# Patient Record
Sex: Female | Born: 1935 | Race: White | Hispanic: No | Marital: Married | Smoking: Former smoker
Health system: Southern US, Community
[De-identification: ages and names within clinical notes are randomized; demographics above are authoritative.]

## PROBLEM LIST (undated history)

## (undated) DIAGNOSIS — I4891 Unspecified atrial fibrillation: Secondary | ICD-10-CM

## (undated) DIAGNOSIS — F0390 Unspecified dementia without behavioral disturbance: Secondary | ICD-10-CM

## (undated) DIAGNOSIS — C439 Malignant melanoma of skin, unspecified: Secondary | ICD-10-CM

## (undated) DIAGNOSIS — I10 Essential (primary) hypertension: Secondary | ICD-10-CM

## (undated) HISTORY — PX: SKIN GRAFT: SHX250

## (undated) HISTORY — DX: Malignant melanoma of skin, unspecified: C43.9

## (undated) HISTORY — PX: SKIN SURGERY: SHX2413

---

## 2012-05-08 ENCOUNTER — Ambulatory Visit: Payer: Self-pay

## 2012-05-14 ENCOUNTER — Ambulatory Visit: Payer: Self-pay

## 2012-07-06 ENCOUNTER — Ambulatory Visit: Payer: Self-pay | Admitting: Neurology

## 2013-02-04 ENCOUNTER — Inpatient Hospital Stay: Payer: Self-pay

## 2013-02-04 LAB — COMPREHENSIVE METABOLIC PANEL
Albumin: 2.8 g/dL — ABNORMAL LOW (ref 3.4–5.0)
Anion Gap: 10 (ref 7–16)
BUN: 11 mg/dL (ref 7–18)
Bilirubin,Total: 0.5 mg/dL (ref 0.2–1.0)
Creatinine: 0.37 mg/dL — ABNORMAL LOW (ref 0.60–1.30)
EGFR (Non-African Amer.): >60
Potassium: 3.8 mmol/L (ref 3.5–5.1)
SGOT(AST): 35 U/L (ref 15–37)
Sodium: 139 mmol/L (ref 136–145)

## 2013-02-04 LAB — CBC
HGB: 13.4 g/dL (ref 12.0–16.0)
MCH: 29.5 pg (ref 26.0–34.0)
MCV: 88 fL (ref 80–100)
RBC: 4.54 10*6/uL (ref 3.80–5.20)
RDW: 15.3 % — ABNORMAL HIGH (ref 11.5–14.5)

## 2013-02-04 LAB — PRO B NATRIURETIC PEPTIDE: B-Type Natriuretic Peptide: 1463 pg/mL — ABNORMAL HIGH (ref 0–450)

## 2013-02-04 LAB — TROPONIN I: Troponin-I: <0.02 ng/mL

## 2013-02-05 LAB — BASIC METABOLIC PANEL
Calcium, Total: 8.4 mg/dL — ABNORMAL LOW (ref 8.5–10.1)
Chloride: 106 mmol/L (ref 98–107)
Co2: 25 mmol/L (ref 21–32)
Creatinine: 0.7 mg/dL (ref 0.60–1.30)
EGFR (African American): >60
EGFR (Non-African Amer.): >60
Osmolality: 285 (ref 275–301)
Potassium: 3.9 mmol/L (ref 3.5–5.1)
Sodium: 140 mmol/L (ref 136–145)

## 2013-02-05 LAB — CBC WITH DIFFERENTIAL/PLATELET
Basophil #: 0 10*3/uL (ref 0.0–0.1)
Basophil %: 0.4 %
Eosinophil %: 0.1 %
HCT: 40.2 % (ref 35.0–47.0)
Lymphocyte #: 0.4 10*3/uL — ABNORMAL LOW (ref 1.0–3.6)
Lymphocyte %: 5.4 %
MCH: 29.8 pg (ref 26.0–34.0)
MCV: 89 fL (ref 80–100)
Neutrophil #: 6.4 10*3/uL (ref 1.4–6.5)
RBC: 4.53 10*6/uL (ref 3.80–5.20)
RDW: 15.6 % — ABNORMAL HIGH (ref 11.5–14.5)
WBC: 6.9 10*3/uL (ref 3.6–11.0)

## 2013-02-05 LAB — CK TOTAL AND CKMB (NOT AT ARMC)
CK, Total: 170 U/L (ref 21–215)
CK, Total: 142 U/L (ref 21–215)
CK, Total: 293 U/L — ABNORMAL HIGH (ref 21–215)
CK-MB: 3 ng/mL (ref 0.5–3.6)
CK-MB: 6.4 ng/mL — ABNORMAL HIGH (ref 0.5–3.6)

## 2013-02-05 LAB — TROPONIN I: Troponin-I: 0.22 ng/mL — ABNORMAL HIGH

## 2013-02-06 LAB — BASIC METABOLIC PANEL
Anion Gap: 6 — ABNORMAL LOW (ref 7–16)
Chloride: 106 mmol/L (ref 98–107)
Co2: 27 mmol/L (ref 21–32)
EGFR (Non-African Amer.): >60
Osmolality: 286 (ref 275–301)
Sodium: 139 mmol/L (ref 136–145)

## 2013-03-15 ENCOUNTER — Ambulatory Visit: Payer: Self-pay | Admitting: Oncology

## 2013-03-15 LAB — CBC CANCER CENTER
Eosinophil %: 0.7 %
HCT: 38.6 % (ref 35.0–47.0)
HGB: 12.7 g/dL (ref 12.0–16.0)
Lymphocyte #: 1.8 x10 3/mm (ref 1.0–3.6)
Lymphocyte %: 12.7 %
MCH: 29.4 pg (ref 26.0–34.0)
MCHC: 33 g/dL (ref 32.0–36.0)
Monocyte %: 6.8 %
Neutrophil %: 79.4 %
Platelet: 270 x10 3/mm (ref 150–440)
RBC: 4.33 10*6/uL (ref 3.80–5.20)
RDW: 15.8 % — ABNORMAL HIGH (ref 11.5–14.5)
WBC: 14.1 x10 3/mm — ABNORMAL HIGH (ref 3.6–11.0)

## 2013-03-15 LAB — COMPREHENSIVE METABOLIC PANEL
Albumin: 2.8 g/dL — ABNORMAL LOW (ref 3.4–5.0)
Anion Gap: 1 — ABNORMAL LOW (ref 7–16)
Bilirubin,Total: 0.4 mg/dL (ref 0.2–1.0)
Chloride: 106 mmol/L (ref 98–107)
Co2: 33 mmol/L — ABNORMAL HIGH (ref 21–32)
Creatinine: 0.68 mg/dL (ref 0.60–1.30)
EGFR (African American): >60
EGFR (Non-African Amer.): >60
Glucose: 101 mg/dL — ABNORMAL HIGH (ref 65–99)
Osmolality: 280 (ref 275–301)
Potassium: 4.6 mmol/L (ref 3.5–5.1)
SGOT(AST): 23 U/L (ref 15–37)
SGPT (ALT): 24 U/L (ref 12–78)

## 2013-03-15 LAB — LACTATE DEHYDROGENASE: LDH: 225 U/L (ref 81–246)

## 2013-03-20 ENCOUNTER — Ambulatory Visit: Payer: Self-pay | Admitting: Oncology

## 2013-03-29 ENCOUNTER — Ambulatory Visit: Payer: Self-pay | Admitting: Oncology

## 2013-04-19 ENCOUNTER — Ambulatory Visit: Payer: Self-pay | Admitting: Internal Medicine

## 2013-04-24 LAB — PATHOLOGY REPORT

## 2013-04-29 ENCOUNTER — Ambulatory Visit: Payer: Self-pay | Admitting: Oncology

## 2013-05-09 ENCOUNTER — Ambulatory Visit: Payer: Self-pay | Admitting: Surgery

## 2013-05-09 LAB — CBC WITH DIFFERENTIAL/PLATELET
Basophil #: 0 10*3/uL (ref 0.0–0.1)
Basophil %: 0.3 %
Eosinophil %: 1.8 %
Lymphocyte %: 16 %
MCH: 29.7 pg (ref 26.0–34.0)
MCHC: 33.2 g/dL (ref 32.0–36.0)
Monocyte %: 6.6 %
Neutrophil %: 75.3 %
Platelet: 267 10*3/uL (ref 150–440)
RBC: 4.27 10*6/uL (ref 3.80–5.20)
RDW: 15.8 % — ABNORMAL HIGH (ref 11.5–14.5)
WBC: 8.6 10*3/uL (ref 3.6–11.0)

## 2013-05-09 LAB — BASIC METABOLIC PANEL
Anion Gap: 1 — ABNORMAL LOW (ref 7–16)
Chloride: 105 mmol/L (ref 98–107)
Co2: 33 mmol/L — ABNORMAL HIGH (ref 21–32)
Creatinine: 0.47 mg/dL — ABNORMAL LOW (ref 0.60–1.30)
EGFR (African American): >60
EGFR (Non-African Amer.): >60
Glucose: 74 mg/dL (ref 65–99)

## 2013-05-16 ENCOUNTER — Ambulatory Visit: Payer: Self-pay | Admitting: Internal Medicine

## 2013-05-16 ENCOUNTER — Inpatient Hospital Stay: Payer: Self-pay | Admitting: Family Medicine

## 2013-05-17 ENCOUNTER — Encounter (HOSPITAL_COMMUNITY): Payer: Self-pay | Admitting: Neurology

## 2013-05-17 ENCOUNTER — Inpatient Hospital Stay (HOSPITAL_COMMUNITY): Payer: Medicare Other

## 2013-05-17 ENCOUNTER — Inpatient Hospital Stay (HOSPITAL_COMMUNITY)
Admission: AD | Admit: 2013-05-17 | Discharge: 2013-05-23 | DRG: 065 | Disposition: A | Discharge status: Rehabilitation Facility | Payer: Medicare Other | Source: Other Acute Inpatient Hospital | Attending: Neurology | Admitting: Neurology

## 2013-05-17 ENCOUNTER — Other Ambulatory Visit: Payer: Self-pay | Admitting: Neurology

## 2013-05-17 DIAGNOSIS — E785 Hyperlipidemia, unspecified: Secondary | ICD-10-CM | POA: Diagnosis present

## 2013-05-17 DIAGNOSIS — R4701 Aphasia: Secondary | ICD-10-CM | POA: Diagnosis present

## 2013-05-17 DIAGNOSIS — Z9282 Status post administration of tPA (rtPA) in a different facility within the last 24 hours prior to admission to current facility: Secondary | ICD-10-CM

## 2013-05-17 DIAGNOSIS — I6350 Cerebral infarction due to unspecified occlusion or stenosis of unspecified cerebral artery: Secondary | ICD-10-CM

## 2013-05-17 DIAGNOSIS — I10 Essential (primary) hypertension: Secondary | ICD-10-CM | POA: Diagnosis present

## 2013-05-17 DIAGNOSIS — I6340 Cerebral infarction due to embolism of unspecified cerebral artery: Principal | ICD-10-CM | POA: Diagnosis present

## 2013-05-17 DIAGNOSIS — Z9889 Other specified postprocedural states: Secondary | ICD-10-CM

## 2013-05-17 DIAGNOSIS — IMO0002 Reserved for concepts with insufficient information to code with codable children: Secondary | ICD-10-CM

## 2013-05-17 DIAGNOSIS — Z87891 Personal history of nicotine dependence: Secondary | ICD-10-CM

## 2013-05-17 DIAGNOSIS — R29810 Facial weakness: Secondary | ICD-10-CM | POA: Diagnosis present

## 2013-05-17 DIAGNOSIS — C439 Malignant melanoma of skin, unspecified: Secondary | ICD-10-CM | POA: Diagnosis present

## 2013-05-17 DIAGNOSIS — Z79899 Other long term (current) drug therapy: Secondary | ICD-10-CM

## 2013-05-17 DIAGNOSIS — I639 Cerebral infarction, unspecified: Secondary | ICD-10-CM | POA: Diagnosis present

## 2013-05-17 DIAGNOSIS — F0390 Unspecified dementia without behavioral disturbance: Secondary | ICD-10-CM | POA: Diagnosis present

## 2013-05-17 DIAGNOSIS — I4891 Unspecified atrial fibrillation: Secondary | ICD-10-CM | POA: Diagnosis present

## 2013-05-17 HISTORY — DX: Unspecified dementia, unspecified severity, without behavioral disturbance, psychotic disturbance, mood disturbance, and anxiety: F03.90

## 2013-05-17 HISTORY — DX: Unspecified atrial fibrillation: I48.91

## 2013-05-17 HISTORY — DX: Essential (primary) hypertension: I10

## 2013-05-17 LAB — CBC WITH DIFFERENTIAL/PLATELET
Basophil #: 0 10*3/uL (ref 0.0–0.1)
Basophil %: 0.5 %
Eosinophil #: 0.2 10*3/uL (ref 0.0–0.7)
Lymphocyte #: 1.2 10*3/uL (ref 1.0–3.6)
Lymphocyte %: 15.5 %
MCV: 90 fL (ref 80–100)
Monocyte #: 0.7 x10 3/mm (ref 0.2–0.9)
Neutrophil #: 5.7 10*3/uL (ref 1.4–6.5)
Neutrophil %: 73.4 %
Platelet: 205 10*3/uL (ref 150–440)
RDW: 15.7 % — ABNORMAL HIGH (ref 11.5–14.5)

## 2013-05-17 LAB — GLUCOSE, CAPILLARY: Glucose-Capillary: 122 mg/dL — ABNORMAL HIGH (ref 70–99)

## 2013-05-17 LAB — HEMOGLOBIN A1C
Hgb A1c MFr Bld: 6.2 % — ABNORMAL HIGH (ref <5.7)
Mean Plasma Glucose: 131 mg/dL — ABNORMAL HIGH (ref <117)

## 2013-05-17 LAB — BASIC METABOLIC PANEL
Anion Gap: 2 — ABNORMAL LOW (ref 7–16)
Calcium, Total: 8.2 mg/dL — ABNORMAL LOW (ref 8.5–10.1)
Creatinine: 0.57 mg/dL — ABNORMAL LOW (ref 0.60–1.30)
EGFR (African American): >60
Glucose: 106 mg/dL — ABNORMAL HIGH (ref 65–99)
Potassium: 4.7 mmol/L (ref 3.5–5.1)

## 2013-05-17 LAB — MRSA PCR SCREENING: MRSA by PCR: NEGATIVE

## 2013-05-17 LAB — PROTIME-INR: INR: 0.9

## 2013-05-17 MED ORDER — PANTOPRAZOLE SODIUM 40 MG IV SOLR
40.0000 mg | Freq: Every day | INTRAVENOUS | Status: DC
  Administered 2013-05-17 – 2013-05-19 (×3): 40 mg via INTRAVENOUS
  Filled 2013-05-17 (×3): qty 40

## 2013-05-17 MED ORDER — ACETAMINOPHEN 325 MG PO TABS
650.0000 mg | ORAL_TABLET | ORAL | Status: DC | PRN
  Administered 2013-05-19 – 2013-05-23 (×10): 650 mg via ORAL
  Filled 2013-05-17 (×11): qty 2

## 2013-05-17 MED ORDER — ACETAMINOPHEN 650 MG RE SUPP: 650.0000 mg | RECTAL | Status: DC | PRN

## 2013-05-17 MED ORDER — SENNOSIDES-DOCUSATE SODIUM 8.6-50 MG PO TABS
1.0000 | ORAL_TABLET | Freq: Every evening | ORAL | Status: DC | PRN
  Filled 2013-05-17: qty 1

## 2013-05-17 MED ORDER — LABETALOL HCL 5 MG/ML IV SOLN: 10.0000 mg | INTRAVENOUS | Status: DC | PRN

## 2013-05-17 MED ORDER — INSULIN ASPART 100 UNIT/ML ~~LOC~~ SOLN: 0.0000 [IU] | Freq: Three times a day (TID) | SUBCUTANEOUS | Status: DC

## 2013-05-17 MED ORDER — SODIUM CHLORIDE 0.9 % IV SOLN
INTRAVENOUS | Status: DC
  Administered 2013-05-17 – 2013-05-20 (×3): via INTRAVENOUS

## 2013-05-17 MED ORDER — INSULIN ASPART 100 UNIT/ML ~~LOC~~ SOLN: 0.0000 [IU] | SUBCUTANEOUS | Status: DC

## 2013-05-17 NOTE — Code Documentation (Signed)
79 year old female transferred from [facility] this afternoon s/p tPA for sx of acute stroke.  According to report from [facility] RN patient is s/p melanoma removal from left leg with skin graft repair.  She was LSW at 1145 today when she had sudden onset of inability to speak with right facial droop and right arm weakness.  According to notes sent with patient Dr. Mordecai Maes at [facility] noted NIHSS of 11.  Patient was administered IV tPA at 1412.  Dr. Roseanne Reno neurologist here was consulted post tPA and patient was transferred to [facility] by [facility].  On arrival at 1606 stroke team met patient in ED at the bridge for quick assessment for IR consideration.  Dr. Roseanne Reno examined patient and decided not to proceed with IR. Patient was taken straight to 3M04.  NIHSS at that time was 08 - see doc flowsheet for details.  LR was infusing - BP 102/52  - NS bolus 500 cc IV given per order Dr. Roseanne Reno.  Left lower leg VAC dressing site was oozing blood -  Left upper leg graft site was dressed with Kerlix dressing - minimial blood noted.  Dr. Janee Morn - general surgeon in to see patient wounds.  Handoff to [employer].

## 2013-05-17 NOTE — H&P (Signed)
Admission H&P    Chief Complaint: stroke HPI: Melanie Wood is an 79 y.o. female who recently was taken off her Xarelto for 4 days in order to have a melanoma removed. Today at 1115 she was noted to have right facial droop, slurred speech and right arm drift.  [name] was brought to [place] [facility] where code stroke was called.  Patient was given tPA and transferred to [facility] hospital. On arrival patient was brought to 3100. Exam showed improved strength but patient remained dysarthric with expressive difficulties. She does have a wound vac on her left leg with a moderate amount of blood--general surgery has been consulted to see patient and make recommendations.   LSN: [id number] tPA Given: Yes NIHSS 8  Past Medical History  Diagnosis Date  . Dementia   . A-fib   . Hypertension     Past Surgical History  Procedure Laterality Date  . Skin graft    . Skin surgery      No family history on file. Social History:  has no tobacco, alcohol, and drug history on file.  Allergies:  Allergies  Allergen Reactions  . Shellfish Allergy     No prescriptions prior to admission    ROS: Negative with exception of above mentioned in HPI  Physical Examination: There were no vitals taken for this visit.  HEENT-  Normocephalic, no lesions, without obvious abnormality.  Normal external eye and conjunctiva.  Normal TM's bilaterally.  Normal auditory canals and external ears. Normal external nose, mucus membranes and septum.  Normal pharynx. Neck supple with no masses, nodes, nodules or enlargement. Cardiovascular - regular rate and rhythm, S1, S2 normal, no murmur, click, rub or gallop Lungs - chest clear, no wheezing, rales, normal symmetric air entry, Heart exam - S1, S2 normal, no murmur, no gallop, rate regular Abdomen - soft, non-tender; bowel sounds normal; no masses,  no organomegaly Extremities - no edema  Neurologic Examination: Mental Status: Alert, markedly dysarthric with expressive  aphasia.  Able to follow 3 step commands without difficulty. Cranial Nerves: II: Discs flat bilaterally; Visual fields grossly normal, pupils equal, round, reactive to light and accommodation III,IV, VI: ptosis not present, extra-ocular motions intact bilaterally V,VII: right facial droop, facial light touch sensation normal bilaterally VIII: hearing normal bilaterally IX,X: gag reflex present XI: bilateral shoulder shrug XII: midline tongue extension Motor: Right : Upper extremity   5/5    Left:     Upper extremity   5/5  Lower extremity   5/5     Lower extremity   5/5 Tone and bulk:normal tone throughout; no atrophy noted Sensory: Pinprick and light touch intact throughout, bilaterally Deep Tendon Reflexes:  Depressed throughout Plantars: Mute bilaterally Cerebellar: normal finger-to-nose,  normal heel-to-shin test Gait: not tested CV: pulses palpable throughout   No results found for this or any previous visit (from the past 48 hour(s)). No results found.  Assessment: 79 y.o. female with acute stroke (Left MCA) S/P stopping Xarelto for 4 days for melanoma removal from left leg. Patient received tPA while in [facility] and transferred to [place].  Patient will be admitted to stroke service and followed closely.   Stroke Risk Factors - atrial fibrillation and hypertension  Plan: 1. HgbA1c, fasting lipid panel 2. MRI, MRA  of the brain without contrast 3. PT consult, OT consult, Speech consult 4. Echocardiogram 5. Carotid dopplers 6. Prophylactic therapy-Resumption of anticoagulation per Stroke Team if CT scan 24 hours post TPA administration shows no signs of intracranial  hemorrhage 7. Risk factor modification 8. Telemetry monitoring 9. Surgery consulted for recommendations on recent skin gaft   Assessment and plan discussed with attending physician and they are in agreement.    [name] PA-C [facility] [phone]  05/17/2013, 4:41 PM  I  personally participate in this patient's evaluation and management, including neurological examination as well as formulation of the above clinical impression and management recommendations.  [name] M.D. [facility] [phone]

## 2013-05-17 NOTE — Progress Notes (Signed)
VASCULAR LAB PRELIMINARY  PRELIMINARY  PRELIMINARY  PRELIMINARY  Carotid duplex completed.    Preliminary report:  Bilateral:  1-39% ICA stenosis.  Vertebral artery flow is antegrade.     Nima Kemppainen, RVS 05/17/2013, 5:34 PM

## 2013-05-17 NOTE — Consult Note (Signed)
Reason for Consult: Bleeding from melanoma surgery site Referring Physician: Noel Christmas  Melanie Wood is an 79 y.o. female.  HPI: Patient has a history of atrial fibrillation and is usually on Xarelto. This was held for a period of time and she underwent surgery yesterday which included wide excision melanoma left posterior calf, sentinel lymph node biopsy left inguinal region, split-thickness skin graft. This was done and [place] [facility].The patient developed expressive aphasia today consistent with acute stroke. She received TPA at [facility] and was transferred to [facility] hospital.She has been admitted by the stroke service. She was noted to have collection of blood underneath her wound VAC as well as some bleeding from her skin graft donor site on her left thigh. I was asked to see her in consultation by Dr. Roseanne Reno regarding management of the postop bleeding.  Past Medical History  Diagnosis Date  . Dementia   . A-fib   . Hypertension     Past Surgical History  Procedure Laterality Date  . Skin graft    . Skin surgery      No family history on file.  Social History:  has no tobacco, alcohol, and drug history on file.  Allergies:  Allergies  Allergen Reactions  . Shellfish Allergy     Medications:  Prior to Admission:  No prescriptions prior to admission    No results found for this or any previous visit (from the past 48 hour(s)).  No results found.  Review of Systems  Unable to perform ROS: mental status change   There were no vitals taken for this visit. Physical Exam  Constitutional: She appears well-developed and well-nourished. No distress.  HENT:  Head: Normocephalic.  Neck: No tracheal deviation present.  Cardiovascular:  Irregularly irregular rhythm, 80s  Respiratory: Effort normal and breath sounds normal. No stridor. No respiratory distress. She has no wheezes. She has no rales.  GI: Soft. She exhibits no distension. There is no  tenderness.  Musculoskeletal:       Legs: Wound VAC anterior left shin extending around to large area posterior left calf with some blood collected underneath the VAC sponge. Donor site clean with mild ooze left thigh. JP left inguinal region with bloody drainage.  Neurological: She is alert. She exhibits normal muscle tone. GCS eye subscore is 4. GCS verbal subscore is 1. GCS motor subscore is 6.  Awake and follows commands, complete expressive aphasia    Assessment/Plan: Acute CVA with expressive aphasia - management per stroke team  Bleeding secondary to TPA postop day 1 status post wide excision melanoma left calf with split-thickness skin graft coverage and left inguinal sentinel node biopsy. I changed the dressing on her donor site to Xeroform covered with Kerlix. I would leave the Xeroform in place and change the Kerlix daily or when necessary. We will re\re hook her back to suction to try to save the skin graft. This should remain on for 5 days postoperatively. We will continue to follow her. If she continues to have bleeding, she may need her back removed. I spoke with her daughter on the unit and explained the plan of care. I also discussed the plan with Dr. Roseanne Reno.  Thank you for this consult.  Jaymes Revels E 05/17/2013, 4:52 PM

## 2013-05-18 ENCOUNTER — Inpatient Hospital Stay (HOSPITAL_COMMUNITY): Payer: Medicare Other

## 2013-05-18 DIAGNOSIS — I639 Cerebral infarction, unspecified: Secondary | ICD-10-CM | POA: Diagnosis present

## 2013-05-18 DIAGNOSIS — R4701 Aphasia: Secondary | ICD-10-CM | POA: Diagnosis present

## 2013-05-18 DIAGNOSIS — I4891 Unspecified atrial fibrillation: Secondary | ICD-10-CM

## 2013-05-18 DIAGNOSIS — I369 Nonrheumatic tricuspid valve disorder, unspecified: Secondary | ICD-10-CM

## 2013-05-18 LAB — LIPID PANEL
HDL: 71 mg/dL (ref >39)
LDL Cholesterol: 114 mg/dL — ABNORMAL HIGH (ref 0–99)
Total CHOL/HDL Ratio: 3 RATIO
VLDL: 31 mg/dL (ref 0–40)

## 2013-05-18 LAB — GLUCOSE, CAPILLARY
Glucose-Capillary: 117 mg/dL — ABNORMAL HIGH (ref 70–99)
Glucose-Capillary: 83 mg/dL (ref 70–99)
Glucose-Capillary: 89 mg/dL (ref 70–99)
Glucose-Capillary: 94 mg/dL (ref 70–99)
Glucose-Capillary: 98 mg/dL (ref 70–99)

## 2013-05-18 MED ORDER — MOMETASONE FURO-FORMOTEROL FUM 100-5 MCG/ACT IN AERO
2.0000 | INHALATION_SPRAY | Freq: Two times a day (BID) | RESPIRATORY_TRACT | Status: DC
  Administered 2013-05-18 – 2013-05-22 (×9): 2 via RESPIRATORY_TRACT
  Filled 2013-05-18 (×2): qty 8.8

## 2013-05-18 MED ORDER — IPRATROPIUM BROMIDE 0.02 % IN SOLN: 0.5000 mg | RESPIRATORY_TRACT | Status: DC

## 2013-05-18 MED ORDER — TIOTROPIUM BROMIDE MONOHYDRATE 18 MCG IN CAPS
18.0000 ug | ORAL_CAPSULE | Freq: Every day | RESPIRATORY_TRACT | Status: DC
  Administered 2013-05-19 – 2013-05-22 (×4): 18 ug via RESPIRATORY_TRACT
  Filled 2013-05-18: qty 5

## 2013-05-18 MED ORDER — HYDROMORPHONE HCL PF 1 MG/ML IJ SOLN
2.0000 mg | INTRAMUSCULAR | Status: DC | PRN
  Administered 2013-05-18: 2 mg via INTRAVENOUS

## 2013-05-18 MED ORDER — HYDROMORPHONE HCL PF 1 MG/ML IJ SOLN
INTRAMUSCULAR | Status: AC
  Filled 2013-05-18: qty 2

## 2013-05-18 MED ORDER — ALBUTEROL SULFATE (5 MG/ML) 0.5% IN NEBU
2.5000 mg | INHALATION_SOLUTION | Freq: Three times a day (TID) | RESPIRATORY_TRACT | Status: DC | PRN
  Administered 2013-05-18: 2.5 mg via RESPIRATORY_TRACT
  Filled 2013-05-18: qty 0.5

## 2013-05-18 MED ORDER — ALBUTEROL SULFATE (5 MG/ML) 0.5% IN NEBU: 2.5000 mg | INHALATION_SOLUTION | RESPIRATORY_TRACT | Status: DC

## 2013-05-18 MED ORDER — IPRATROPIUM BROMIDE 0.02 % IN SOLN
0.5000 mg | Freq: Three times a day (TID) | RESPIRATORY_TRACT | Status: DC | PRN
  Administered 2013-05-18: 0.5 mg via RESPIRATORY_TRACT
  Filled 2013-05-18: qty 2.5

## 2013-05-18 MED ORDER — TIOTROPIUM BROMIDE MONOHYDRATE 18 MCG IN CAPS: 18.0000 ug | ORAL_CAPSULE | Freq: Every day | RESPIRATORY_TRACT | Status: DC

## 2013-05-18 MED ORDER — ATORVASTATIN CALCIUM 40 MG PO TABS
40.0000 mg | ORAL_TABLET | Freq: Every day | ORAL | Status: DC
  Administered 2013-05-19 – 2013-05-22 (×4): 40 mg via ORAL
  Filled 2013-05-18 (×6): qty 1

## 2013-05-18 MED ORDER — BIOTENE DRY MOUTH MT LIQD
15.0000 mL | Freq: Two times a day (BID) | OROMUCOSAL | Status: DC
  Administered 2013-05-18 – 2013-05-22 (×8): 15 mL via OROMUCOSAL

## 2013-05-18 NOTE — Progress Notes (Signed)
  Subjective: Denying pain.  No significant bleeding from wound.    Objective: Vital signs in last 24 hours: Temp:  [97.6 F (36.4 C)-99.4 F (37.4 C)] 97.6 F (36.4 C) (09/20 0800) Pulse Rate:  [65-97] 84 (09/20 0800) Resp:  [10-24] 18 (09/20 0800) BP: (87-137)/(54-88) 133/63 mmHg (09/20 0800) SpO2:  [94 %-99 %] 98 % (09/20 0800) Weight:  [181 lb 3.5 oz (82.2 kg)] 181 lb 3.5 oz (82.2 kg) (09/19 1630)    Intake/Output from previous day: 09/19 0701 - 09/20 0700 In: 1036.3 [I.V.:1036.3] Out: 1475 [Urine:1055; Drains:420] Intake/Output this shift:    General appearance: alert, cooperative and no distress Resp: breathing comfortably Extremities: left calf wound without significant bleeding.  left thigh donor site without   Lab Results:  No results found for this basename: WBC, HGB, HCT, PLT,  in the last 72 hours BMET No results found for this basename: NA, K, CL, CO2, GLUCOSE, BUN, CREATININE, CALCIUM,  in the last 72 hours PT/INR No results found for this basename: LABPROT, INR,  in the last 72 hours ABG No results found for this basename: PHART, PCO2, PO2, HCO3,  in the last 72 hours  Studies/Results: Dg Chest Port 1 View  05/17/2013   CLINICAL DATA:  Stroke.  EXAM: PORTABLE CHEST - 1 VIEW  COMPARISON:  None.  FINDINGS: Mild cardiomegaly. Mild central peribronchial thickening. No focal infiltrate or overt edema. No effusion. Regional bones unremarkable.  IMPRESSION: Cardiomegaly without focal infiltrate   Electronically Signed   By: Oley Balm M.D.   On: 05/17/2013 23:29    Anti-infectives: Anti-infectives   None      Assessment/Plan: s/p * No surgery found * continue vac on graft site on left calf and xeroform to donor site on left thigh.    Anticoagulation per neuro.  Would do whatever pt needs for neuro.   Current plan is to take vac off on Tuesday for examination.     LOS: 1 day    [facility] 05/18/2013

## 2013-05-18 NOTE — Progress Notes (Signed)
  Echocardiogram 2D Echocardiogram has been performed.  Melanie Wood 05/18/2013, 11:07 AM

## 2013-05-18 NOTE — Evaluation (Signed)
Clinical/Bedside Swallow Evaluation Patient Details  Name: Melanie Wood MRN: [id number] Date of Birth: 1930-07-27  Today's Date: 05/18/2013 Time: 1200-1220 SLP Time Calculation (min): 20 min  Past Medical History:  Past Medical History  Diagnosis Date  . Dementia   . A-fib   . Hypertension    Past Surgical History:  Past Surgical History  Procedure Laterality Date  . Skin graft    . Skin surgery     HPI:  Melanie Wood is an 79 y.o. female who recently was taken off her Xarelto for 4 days in order to have a melanoma removed. Today at 1115 she was noted to have right facial droop, slurred speech and right arm drift.  [name] was brought to [place] [facility] where code stroke was called.  Patient was given tPA and transferred to [facility] hospital. On arrival patient was brought to 3100. Exam showed improved strength but patient remained dysarthric with expressive difficulties. She does have a wound vac on her left leg with a moderate amount of blood--general surgery has been consulted to see patient and make recommendations.  MRI/CT pending this pm.   BSE indicated per Stroke Protocol.    Assessment / Plan / Recommendation Clinical Impression  BSE completed.  Min to moderate sensory motor oral dysphagia with moderate sensory motor pharyngeal dysphagia.  Oral residue on right lingual and anterior sulci s/p swallow of mechanical soft solids.  Pharyngeal phase marked by delay in initiation with reduced hyoid laryngeal elevation.  Delayed throat clears after swallow of thin water by cup due to too large of sip due to suspected penetration vs. Residuals.  Delayed dry coughing s/p swallow of mechanical soft solid.  Recommend to initiate conservative diet of dysphagia 1 (puree) and thin liquid by cup sips only with strict aspiration precautions.  Recommend full supervision to cue patient to utilize swallow strategies as aspiration risk remains.  Diagnostic treatment completed following evaluation  focusing on providing skilled education to caregivers on recommended swallow strategies to decrease risk for aspiration.   ST to f/u on 05/19/13 for diet tolerance.  Completion of objective evaluation to be determined.      Aspiration Risk  Moderate    Diet Recommendation Dysphagia 1 (Puree);Thin liquid   Liquid Administration via: Cup;No straw Medication Administration: Crushed with puree Supervision: Patient able to self feed;Full supervision/cueing for compensatory strategies Compensations: Slow rate;Small sips/bites;Check for pocketing;Multiple dry swallows after each bite/sip;Clear throat intermittently;Hard cough after swallow;Effortful swallow Postural Changes and/or Swallow Maneuvers: Seated upright 90 degrees    Other  Recommendations Oral Care Recommendations: Oral care before and after PO   Follow Up Recommendations  Inpatient Rehab    Frequency and Duration min 2x/week  2 weeks       SLP Swallow Goals Patient will utilize recommended strategies during swallow to increase swallowing safety with: Moderate assistance   Swallow Study Prior Functional Status  Independent lives at home     General Date of Onset: 05/17/13 HPI: Melanie Wood is an 79 y.o. female who recently was taken off her Xarelto for 4 days in order to have a melanoma removed. Today at 1115 she was noted to have right facial droop, slurred speech and right arm drift.  [name] was brought to [place] [facility] where code stroke was called.  Patient was given tPA and transferred to [facility] hospital. On arrival patient was brought to 3100. Exam showed improved strength but patient remained dysarthric with expressive difficulties. She does have a wound vac on her left leg with  a moderate amount of blood--general surgery has been consulted to see patient and make recommendations.   Type of Study: Bedside swallow evaluation Diet Prior to this Study: NPO Temperature Spikes Noted: No Respiratory Status: Room  air History of Recent Intubation: No Behavior/Cognition: Cooperative;Pleasant mood;Confused;Requires cueing Oral Cavity - Dentition: Adequate natural dentition Self-Feeding Abilities: Needs assist;Able to feed self Baseline Vocal Quality: Clear Volitional Cough: Strong Volitional Swallow: Able to elicit    Oral/Motor/Sensory Function Overall Oral Motor/Sensory Function: Impaired Labial ROM: Reduced right Labial Symmetry: Abnormal symmetry right Labial Strength: Reduced Labial Sensation: Reduced Lingual ROM: Reduced right Lingual Symmetry: Abnormal symmetry right Lingual Strength: Reduced Lingual Sensation: Reduced Facial ROM: Reduced right Facial Symmetry: Right droop Facial Strength: Reduced Facial Sensation: Reduced Velum: Within Functional Limits Mandible: Within Functional Limits   Ice Chips Ice chips: Impaired Pharyngeal Phase Impairments: Suspected delayed Swallow;Decreased hyoid-laryngeal movement   Thin Liquid Thin Liquid: Impaired Presentation: Cup;Spoon Pharyngeal  Phase Impairments: Suspected delayed Swallow;Decreased hyoid-laryngeal movement;Throat Clearing - Delayed    Nectar Thick Nectar Thick Liquid: Impaired Pharyngeal Phase Impairments: Suspected delayed Swallow;Decreased hyoid-laryngeal movement;Multiple swallows;Wet Vocal Quality   Honey Thick Honey Thick Liquid: Not tested   Puree Puree: Impaired Pharyngeal Phase Impairments: Suspected delayed Swallow;Decreased hyoid-laryngeal movement   Solid   GO    Solid: Impaired Oral Phase Functional Implications: Oral residue Pharyngeal Phase Impairments: Suspected delayed Swallow;Decreased hyoid-laryngeal movement;Cough - Delayed      Moreen Fowler MS, CCC-SLP [phone] [facility] 05/18/2013,1:16 PM

## 2013-05-18 NOTE — Progress Notes (Signed)
PT Cancellation Note  Patient Details Name: Melanie Wood MRN: [id number] DOB: October 12, 1929   Cancelled Treatment:    Reason Eval/Treat Not Completed: Medical issues which prohibited therapy.  Pt still on bedrest from tPA until 1412.  Nursing asked PT to HOLD therapy until tomorrow am.  Will return tomorrow.  Thanks.   INGOLD,Myria Steenbergen 05/18/2013, 12:55 PM  [facility] [phone] (pager)

## 2013-05-18 NOTE — Evaluation (Signed)
Speech Language Pathology Evaluation Patient Details Name: Melanie Wood MRN: [id number] DOB: [birth year] Today's Date: 05/18/2013 Time: 1220-1250 SLP Time Calculation (min): 30 min  Problem List:  Patient Active Problem List   Diagnosis Date Noted  . CVA (cerebral infarction) 05/18/2013  . Aphasia 05/18/2013  . Atrial fibrillation 05/18/2013   Past Medical History:  Past Medical History  Diagnosis Date  . Dementia   . A-fib   . Hypertension    Past Surgical History:  Past Surgical History  Procedure Laterality Date  . Skin graft    . Skin surgery     HPI:  Melanie Wood is an 79 y.o. female who recently was taken off her Xarelto for 4 days in order to have a melanoma removed. Today at 1115 she was noted to have right facial droop, slurred speech and right arm drift.  [name] was brought to [place] [facility] where code stroke was called.  Patient was given tPA and transferred to [facility] hospital. On arrival patient was brought to 3100. Exam showed improved strength but patient remained dysarthric with expressive difficulties. She does have a wound vac on her left leg with a moderate amount of blood--general surgery has been consulted to see patient and make recommendations.  Cognitive Linguistic Evaluation indicated per Stroke Protocol.     Assessment / Plan / Recommendation Clinical Impression  Cognitive Linguistic Evaluation completed.  Evaluation indicates minimal receptive aphasia with moderate  To severe expressive asphasia.  Comprehension of basic yes/no questions functional with increase in errors when challenged with higher levels of complexity.  Cognitive portion of evaluation limited secondary to expressive  aphasia.  Recommend ST in acute care setting to address above deficits to improve functional communication skills in current setting.  Recommend further cognitive assessment.  Patient to benefit from [facility] consult.     SLP Assessment  Patient needs continued Speech  [facility]    Follow Up Recommendations  Inpatient Rehab    Frequency and Duration min 2x/week  2 weeks      SLP Goals  SLP Goals Potential to Achieve Goals: Good Potential Considerations: Cooperation/participation level;Previous level of function;Family/community support Progress/Goals/Alternative treatment plan discussed with pt/caregiver and they: Agree SLP Goal #1: Correctly answer complex yes/no questions 10/10 correct with minimal repetition SLP Goal #2: Label 10/10 objects used in ADL's with moderate phonemic and semantic cues SLP Goal #3: Recite automatic sequences with moderate verbal and visual cues to initiate inital word SLP Goal #4: Repeat SLP's production of high probability phrases x5 SLP Goal #5: Complete target basic problem solving tasks with max assistance  SLP Evaluation Prior Functioning  Cognitive/Linguistic Baseline: Within functional limits Type of Home: Independent living facility Available Help at Discharge: Available 24 hours/day;Family Vocation: Retired   IT consultant  Overall Cognitive Status: Impaired/Different from baseline Arousal/Alertness: Awake/alert Orientation Level: Oriented X4 Attention: Sustained Sustained Attention: Impaired Sustained Attention Impairment: Verbal complex;Verbal basic;Functional basic Memory: Appears intact Awareness: Appears intact Problem Solving: Impaired Problem Solving Impairment: Functional basic Behaviors: Restless    Comprehension  Auditory Comprehension Overall Auditory Comprehension: Impaired Yes/No Questions: Impaired Complex Questions: 25-49% accurate Commands: Impaired Multistep Basic Commands: 0-24% accurate Interfering Components: Attention;Processing speed    Expression Verbal Expression Overall Verbal Expression: Impaired Initiation: Impaired Automatic Speech: Name;Social Response;Counting;Day of week;Month of year Level of Generative/Spontaneous Verbalization:  Word Repetition: Impaired Level of Impairment: Word level Naming: Impairment Responsive: 0-25% accurate Confrontation: Impaired Convergent: 0-24% accurate Divergent: 0-24% accurate Pragmatics: No impairment Effective Techniques: Open ended questions;Semantic cues;Phonemic cues Written Expression  Dominant Hand: Right Written Expression: Not tested   Oral / Motor Oral Motor/Sensory Function Overall Oral Motor/Sensory Function: Impaired Labial ROM: Reduced right Labial Symmetry: Abnormal symmetry right Labial Strength: Reduced Labial Sensation: Reduced Lingual ROM: Reduced right Lingual Symmetry: Abnormal symmetry right Lingual Strength: Reduced Lingual Sensation: Reduced Facial ROM: Reduced right Facial Symmetry: Right droop Facial Strength: Reduced Facial Sensation: Reduced Velum: Impaired right Mandible: Within Functional Limits Motor Speech Overall Motor Speech: Impaired Respiration: Impaired Level of Impairment: Word Phonation: Normal Resonance: Within functional limits Articulation: Impaired Level of Impairment: Word Intelligibility: Intelligibility reduced Word: 0-24% accurate   GO    Moreen Fowler MS, CCC-SLP [phone] [facility] 05/18/2013, 2:16 PM

## 2013-05-18 NOTE — Progress Notes (Addendum)
Stroke Team Progress Note  HISTORY Melanie Wood is an 79 y.o. female with hx of A  Fib who recently was taken off her Xarelto for 4 days in order to have a melanoma removed. Today at 1115 she was noted to have right facial droop, slurred speech and right arm drift. [name] was brought to [facility] where code stroke was called. Patient was given tPA oat 1430 on 9/19 and transferred to [facility] hospital. On arrival patient was brought to 3100. Exam showed improved strength but patient remained dysarthric with expressive difficulties. She does have a wound vac on her left leg with a moderate amount of blood--general surgery has been consulted to see patient and make recommendations.    She was admitted to the neuro ICU  for further evaluation and treatment.  SUBJECTIVE Her daughter is at the bedside.  Overall she feels her condition is stable. Per the daughter she is interactive, frustrated over her lack of ability to communicate. Appears to understand, follows commands. Swallow evaluation has not been completed.  Touched base with surgery to discuss wound healing vs re-starting anti-coagulation. Per their recs, stroke prevention should take precedence over preserving the graft. They note it is a superficial graft with no risk of internal bleeding.   OBJECTIVE Most recent Vital Signs: Filed Vitals:   05/18/13 0600 05/18/13 0700 05/18/13 0705 05/18/13 0800  BP: 127/55  130/72 133/63  Pulse: 80 95 93 84  Temp:    97.6 F (36.4 C)  TempSrc:    Oral  Resp: 11 16 21 18   Height:      Weight:      SpO2: 97% 97% 97% 98%   CBG (last 3)   Recent Labs  05/17/13 2052 05/17/13 2351 05/18/13 0338  GLUCAP 122* 98 94    IV Fluid Intake:   . sodium chloride 75 mL/hr at 05/18/13 0700    MEDICATIONS  . insulin aspart  0-9 Units Subcutaneous TID WC  . pantoprazole (PROTONIX) IV  40 mg Intravenous QHS   PRN:  acetaminophen, acetaminophen, labetalol, senna-docusate  Diet:  NPO  Activity:   Bedrest DVT Prophylaxis: compression devices  CLINICALLY SIGNIFICANT STUDIES Basic Metabolic Panel: No results found for this basename: NA, K, CL, CO2, GLUCOSE, BUN, CREATININE, CALCIUM, MG, PHOS,  in the last 168 hours Liver Function Tests: No results found for this basename: AST, ALT, ALKPHOS, BILITOT, PROT, ALBUMIN,  in the last 168 hours CBC: No results found for this basename: WBC, NEUTROABS, HGB, HCT, MCV, PLT,  in the last 168 hours Coagulation: No results found for this basename: LABPROT, INR,  in the last 168 hours Cardiac Enzymes: No results found for this basename: CKTOTAL, CKMB, CKMBINDEX, TROPONINI,  in the last 168 hours Urinalysis: No results found for this basename: COLORURINE, APPERANCEUR, LABSPEC, PHURINE, GLUCOSEU, HGBUR, BILIRUBINUR, KETONESUR, PROTEINUR, UROBILINOGEN, NITRITE, LEUKOCYTESUR,  in the last 168 hours Lipid Panel    Component Value Date/Time   CHOL 216* 05/18/2013 0430   TRIG 155* 05/18/2013 0430   HDL 71 05/18/2013 0430   CHOLHDL 3.0 05/18/2013 0430   VLDL 31 05/18/2013 0430   LDLCALC 114* 05/18/2013 0430   HgbA1C  Lab Results  Component Value Date   HGBA1C 6.2* 05/17/2013    Urine Drug Screen:   No results found for this basename: labopia, cocainscrnur, labbenz, amphetmu, thcu, labbarb    Alcohol Level: No results found for this basename: ETH,  in the last 168 hours  Dg Chest Port 1 View  05/17/2013  CLINICAL DATA:  Stroke.  EXAM: PORTABLE CHEST - 1 VIEW  COMPARISON:  None.  FINDINGS: Mild cardiomegaly. Mild central peribronchial thickening. No focal infiltrate or overt edema. No effusion. Regional bones unremarkable.  IMPRESSION: Cardiomegaly without focal infiltrate   Electronically Signed   By: Oley Balm M.D.   On: 05/17/2013 23:29    CT of the brain    MRI of the brain    MRA of the brain    2D Echocardiogram    Carotid Doppler: Preliminary report: Bilateral: 1-39% ICA stenosis. Vertebral artery flow is antegrade.   CXR       Therapy Recommendations pending  Physical Exam   Gen:NAD HEENT- Normocephalic, no lesions, without obvious abnormality. Normal external eye and conjunctiva. Normal TM's bilaterally. Normal auditory canals and external ears. Normal external nose, mucus membranes and septum. Normal pharynx.  Neck supple with no masses, nodes, nodules or enlargement.  Cardiovascular - regular rate and rhythm, S1, S2 normal, no murmur, click, rub or gallop  Lungs - chest clear, no wheezing, rales, normal symmetric air entry, Heart exam - S1, S2 normal, no murmur, no gallop, rate regular  Abdomen - soft, non-tender; bowel sounds normal; no masses, no organomegaly  Extremities - no edema  Neurologic Examination:  Mental Status:  Alert, markedly dysarthric with expressive aphasia. Able to follow 3 step commands without difficulty.  Cranial Nerves:  II: Discs flat bilaterally; Visual fields grossly normal, pupils equal, round, reactive to light and accommodation  III,IV, VI: ptosis not present, extra-ocular motions intact bilaterally  V,VII: right facial droop, facial light touch sensation normal bilaterally  VIII: hearing normal bilaterally  IX,X: gag reflex present  XI: bilateral shoulder shrug  XII: midline tongue extension  Motor:  Right : Upper extremity 5/5 Left: Upper extremity 5/5  Lower extremity 5/5 Lower extremity 5/5  Tone and bulk:normal tone throughout; no atrophy noted  Sensory: Pinprick and light touch intact throughout, bilaterally  Deep Tendon Reflexes:  Depressed throughout  Plantars:  Mute bilaterally  Cerebellar:  normal finger-to-nose, normal heel-to-shin test  Gait: not tested  ASSESSMENT [name] is a 79 y.o. female presenting with right facial droop, slurred speech and right arm drift. Status post IV t-PA.. Imaging pending. Infarct felt to be  embolic secondary to holding Xarelto for surgical procedure.  On Xarelto prior to admission. Holding antiplatelet and  anticoagulation for 24hrs post TPA.. Patient with resultant dysarthria and marked expressive aphasia. Work up underway.   Ischemic infarct likely 2/2 embolic event from A fib/discontinuation of Xarelto  S/o melanoma excision with wound vac in place  Hyperlipidemia  A fib  HbA1c of 6.2  Hospital day # 1  TREATMENT/PLAN  Follow up MRI  Restart anticoagulation vs antiplatelet pending MRI results-if no signs of hemorrhage would re-start Xarelto, if petechial hemorrhage would start Plavix 75mg   Blood pressure control  Started statin  Rehab and swallow evaluation  Echo pending  This patient is critically ill and at significant risk of neurological worsening, death and care requires constant monitoring of vital signs, hemodynamics,respiratory and cardiac monitoring,review of multiple databases, neurological assessment, discussion with family, other specialists and medical decision making of high complexity. I spent 55 minutes of neurocritical care time in the care of this patient.  Elspeth Cho, DO Neurology-Stroke

## 2013-05-18 NOTE — Plan of Care (Signed)
Problem: tPA Day Progression Outcomes-Only if tPA administered Goal: Post tPA no anticoagulants/antiplatelets 24 hrs Outcome: Not Applicable Date Met:  05/18/13 Patients anticoags were already being held for a planned procedure on 9/18

## 2013-05-19 ENCOUNTER — Encounter (HOSPITAL_COMMUNITY): Payer: Self-pay

## 2013-05-19 LAB — GLUCOSE, CAPILLARY: Glucose-Capillary: 100 mg/dL — ABNORMAL HIGH (ref 70–99)

## 2013-05-19 MED ORDER — HYDROMORPHONE HCL PF 1 MG/ML IJ SOLN
1.0000 mg | INTRAMUSCULAR | Status: DC | PRN
  Administered 2013-05-19 – 2013-05-22 (×4): 1 mg via INTRAVENOUS
  Filled 2013-05-19 (×5): qty 1

## 2013-05-19 NOTE — Progress Notes (Signed)
Weekend CSW spoke with patient's daughters regarding their interest in the [place]. Daughters stated that they currently have a hotel for the night and plan to stay in the room with their mother the following nights. CSW informed daughters that if they wanted more information on the [place], they could ask to speak to spiritual care.  Samuella Bruin, MSW, LCSWA Clinical Social Worker [facility] Emergency Dept. [phone]

## 2013-05-19 NOTE — Progress Notes (Addendum)
Speech Language Pathology Treatment Patient Details Name: Melanie Wood MRN: [id number] DOB: [birth year] Today's Date: 05/19/2013 Time: 4098-1191 SLP Time Calculation (min): 35 min  Assessment / Plan / Recommendation Clinical Impression  Oral and pharyngeal dysphagia and Expressive Asphasia.  ST to continue in Acute Care Setting. Patient to benefit from [facility] consult.     SLP Plan  Continue with current plan of care       SLP Goals  SLP Goals Potential to Achieve Goals: Good Potential Considerations: Cooperation/participation level;Family/community support Progress/Goals/Alternative treatment plan discussed with pt/caregiver and they: Agree SLP Goal #1 - Progress: Progressing toward goal SLP Goal #2 - Progress: Progressing toward goal SLP Goal #3 - Progress: Progressing toward goal  General Temperature Spikes Noted: No Respiratory Status: Room air Behavior/Cognition: Alert;Cooperative;Pleasant mood;Distractible;Requires cueing Oral Cavity - Dentition: Adequate natural dentition Patient Positioning: Upright in chair  Oral Cavity - Oral Hygiene Does patient have any of the following "at risk" factors?: Other - dysphagia Patient is HIGH RISK - Oral Care Protocol followed (see row info): Yes Patient is AT RISK - Oral Care Protocol followed (see row info): Yes   Treatment Treatment focused on: Aphasia;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  Patient seen at noon meal sitting upright in chair with family members present.  Patient required set up but self administered PO's.  Mod to Max cues to complete recommended swallow strategies.  Absent self-monitoring skills during meal and easily frustrated with redirection. Attempted written cues but not effective due to combination of deficits in reading  comprehension and suspected visual deficit.   Suspect penetration during swallow with thin liquids but patient demonstrates effective cough. Will continue to monitor.   Continued decreased reserve with patient requiring several rest breaks throughout meal.    Improvement in area of expressive language as patient able to correctly label 5/5 common objects used for ADL's and label 2/5 pictures.  Difficulty labeling low probability pictures but able to gesture function of object.  Recommend to continue current diet consistency with full supervision.  ST to continue in Acute Care Setting for diet tolerance and possible advancement and to address communication.  GO    Moreen Fowler MS, CCC-SLP [phone] [facility] 05/19/2013, 4:41 PM

## 2013-05-19 NOTE — Progress Notes (Signed)
Stroke Team Progress Note  HISTORY Melanie Wood is an 79 y.o. female with hx of A  Fib who recently was taken off her Xarelto for 4 days in order to have a melanoma removed. Today at 1115 she was noted to have right facial droop, slurred speech and right arm drift. [name] was brought to [place] [facility] where code stroke was called. Patient was given tPA oat 1430 on 9/19 and transferred to [facility] hospital. On arrival patient was brought to 3100. Exam showed improved strength but patient remained dysarthric with expressive difficulties. She does have a wound vac on her left leg with a moderate amount of blood--general surgery has been consulted to see patient and make recommendations.    She was admitted to the neuro ICU  for further evaluation and treatment.  SUBJECTIVE Her daughter is at the bedside.  Overall she feels her condition is improved, patient has said a few words. Continues to be interactive, frustrated at difficulty speaking.  OBJECTIVE Most recent Vital Signs: Filed Vitals:   05/19/13 0400 05/19/13 0500 05/19/13 0600 05/19/13 0700  BP: 90/50 114/66 112/58 108/60  Pulse: 74 74 76 77  Temp: 99.2 F (37.3 C)     TempSrc: Oral     Resp: 13 12 13 12   Height:      Weight:      SpO2: 100% 99% 100% 100%   CBG (last 3)   Recent Labs  05/18/13 1302 05/18/13 2026 05/19/13 0735  GLUCAP 89 117* 73    IV Fluid Intake:   . sodium chloride 75 mL/hr at 05/19/13 0232    MEDICATIONS  . antiseptic oral rinse  15 mL Mouth Rinse BID  . atorvastatin  40 mg Oral q1800  . insulin aspart  0-9 Units Subcutaneous TID WC  . mometasone-formoterol  2 puff Inhalation BID  . pantoprazole (PROTONIX) IV  40 mg Intravenous QHS  . tiotropium  18 mcg Inhalation Daily   PRN:  acetaminophen, acetaminophen, albuterol, HYDROmorphone (DILAUDID) injection, ipratropium, labetalol, senna-docusate  Diet:  Dysphagia  Activity:  Bedrest DVT Prophylaxis: compression devices  CLINICALLY SIGNIFICANT  STUDIES Basic Metabolic Panel: No results found for this basename: NA, K, CL, CO2, GLUCOSE, BUN, CREATININE, CALCIUM, MG, PHOS,  in the last 168 hours Liver Function Tests: No results found for this basename: AST, ALT, ALKPHOS, BILITOT, PROT, ALBUMIN,  in the last 168 hours CBC: No results found for this basename: WBC, NEUTROABS, HGB, HCT, MCV, PLT,  in the last 168 hours Coagulation: No results found for this basename: LABPROT, INR,  in the last 168 hours Cardiac Enzymes: No results found for this basename: CKTOTAL, CKMB, CKMBINDEX, TROPONINI,  in the last 168 hours Urinalysis: No results found for this basename: COLORURINE, APPERANCEUR, LABSPEC, PHURINE, GLUCOSEU, HGBUR, BILIRUBINUR, KETONESUR, PROTEINUR, UROBILINOGEN, NITRITE, LEUKOCYTESUR,  in the last 168 hours Lipid Panel    Component Value Date/Time   CHOL 216* 05/18/2013 0430   TRIG 155* 05/18/2013 0430   HDL 71 05/18/2013 0430   CHOLHDL 3.0 05/18/2013 0430   VLDL 31 05/18/2013 0430   LDLCALC 114* 05/18/2013 0430   HgbA1C  Lab Results  Component Value Date   HGBA1C 6.2* 05/18/2013    Urine Drug Screen:   No results found for this basename: labopia,  cocainscrnur,  labbenz,  amphetmu,  thcu,  labbarb    Alcohol Level: No results found for this basename: ETH,  in the last 168 hours  Mr Brain Wo Contrast  05/18/2013   CLINICAL DATA:  Stroke.  TPA given  EXAM: MRI HEAD WITHOUT CONTRAST  MRA HEAD WITHOUT CONTRAST  TECHNIQUE: Multiplanar, multiecho pulse sequences of the brain and surrounding structures were obtained without intravenous contrast. Angiographic images of the head were obtained using MRA technique without contrast.  COMPARISON:  None.  FINDINGS: MRI HEAD FINDINGS  Multiple areas of acute infarct bilaterally suggestive of cerebral emboli. The largest area of acute infarct is in the left posterior frontal cortex. Small areas of acute infarct in the frontal lobes bilaterally and in the left occipital lobe. Small acute infarcts in  the right thalamus and right occipital lobe. No brainstem infarction.  Moderate atrophy with ventricular enlargement most likely related to the atrophy. Chronic microvascular ischemic change of a moderate degree throughout the white matter bilaterally.  Negative for hemorrhage or mass lesion.  MRA HEAD FINDINGS  Both vertebral arteries are patent to the basilar. PICA patent bilaterally. Basilar widely patent. Superior cerebellar and posterior cerebral arteries widely patent. Fetal origin left posterior cerebral artery.  Right internal carotid artery widely patent. Right anterior and middle cerebral arteries widely patent.  Left internal carotid artery widely patent. Left anterior and middle cerebral artery is widely patent without significant stenosis.  Negative for cerebral aneurysm.  IMPRESSION: MRI HEAD IMPRESSION  Multiple areas of acute infarct in the cerebral hemispheres bilaterally consistent with cerebral emboli. Largest infarct in the left posterior frontal cortex. Generalized atrophy and chronic microvascular ischemic changes in the white matter.  MRA HEAD IMPRESSION  Negative intracranial MRA   Electronically Signed   By: Marlan Palau M.D.   On: 05/18/2013 16:06   Dg Chest Port 1 View  05/17/2013   CLINICAL DATA:  Stroke.  EXAM: PORTABLE CHEST - 1 VIEW  COMPARISON:  None.  FINDINGS: Mild cardiomegaly. Mild central peribronchial thickening. No focal infiltrate or overt edema. No effusion. Regional bones unremarkable.  IMPRESSION: Cardiomegaly without focal infiltrate   Electronically Signed   By: Oley Balm M.D.   On: 05/17/2013 23:29   Mr Maxine Glenn Head/brain Wo Cm  05/18/2013   CLINICAL DATA:  Stroke.  TPA given  EXAM: MRI HEAD WITHOUT CONTRAST  MRA HEAD WITHOUT CONTRAST  TECHNIQUE: Multiplanar, multiecho pulse sequences of the brain and surrounding structures were obtained without intravenous contrast. Angiographic images of the head were obtained using MRA technique without contrast.   COMPARISON:  None.  FINDINGS: MRI HEAD FINDINGS  Multiple areas of acute infarct bilaterally suggestive of cerebral emboli. The largest area of acute infarct is in the left posterior frontal cortex. Small areas of acute infarct in the frontal lobes bilaterally and in the left occipital lobe. Small acute infarcts in the right thalamus and right occipital lobe. No brainstem infarction.  Moderate atrophy with ventricular enlargement most likely related to the atrophy. Chronic microvascular ischemic change of a moderate degree throughout the white matter bilaterally.  Negative for hemorrhage or mass lesion.  MRA HEAD FINDINGS  Both vertebral arteries are patent to the basilar. PICA patent bilaterally. Basilar widely patent. Superior cerebellar and posterior cerebral arteries widely patent. Fetal origin left posterior cerebral artery.  Right internal carotid artery widely patent. Right anterior and middle cerebral arteries widely patent.  Left internal carotid artery widely patent. Left anterior and middle cerebral artery is widely patent without significant stenosis.  Negative for cerebral aneurysm.  IMPRESSION: MRI HEAD IMPRESSION  Multiple areas of acute infarct in the cerebral hemispheres bilaterally consistent with cerebral emboli. Largest infarct in the left posterior frontal cortex. Generalized atrophy and chronic microvascular ischemic  changes in the white matter.  MRA HEAD IMPRESSION  Negative intracranial MRA   Electronically Signed   By: Marlan Palau M.D.   On: 05/18/2013 16:06    CT of the brain    MRI of the brain   9/20 MRI HEAD IMPRESSION  Multiple areas of acute infarct in the cerebral hemispheres  bilaterally consistent with cerebral emboli. Largest infarct in the  left posterior frontal cortex. Generalized atrophy and chronic  microvascular ischemic changes in the white matter.   MRA of the brain   9/20 MRA HEAD IMPRESSION  Negative intracranial MRA  2D Echocardiogram    Carotid  Doppler: Preliminary report: Bilateral: 1-39% ICA stenosis. Vertebral artery flow is antegrade.   CXR      Therapy Recommendations pending  Physical Exam   Gen:NAD HEENT- Normocephalic, no lesions, without obvious abnormality. Normal external eye and conjunctiva. Normal TM's bilaterally. Normal auditory canals and external ears. Normal external nose, mucus membranes and septum. Normal pharynx.  Neck supple with no masses, nodes, nodules or enlargement.  Cardiovascular - regular rate and rhythm, S1, S2 normal, no murmur, click, rub or gallop  Lungs - chest clear, no wheezing, rales, normal symmetric air entry, Heart exam - S1, S2 normal, no murmur, no gallop, rate regular  Abdomen - soft, non-tender; bowel sounds normal; no masses, no organomegaly  Extremities - no edema  Neurologic Examination:  Mental Status:  Alert, markedly dysarthric with expressive aphasia. Able to follow 3 step commands without difficulty.  Cranial Nerves:  II: Discs flat bilaterally; Visual fields grossly normal, pupils equal, round, reactive to light and accommodation  III,IV, VI: ptosis not present, extra-ocular motions intact bilaterally  V,VII: right facial droop, facial light touch sensation normal bilaterally  VIII: hearing normal bilaterally  IX,X: gag reflex present  XI: bilateral shoulder shrug  XII: midline tongue extension  Motor:  Right : Upper extremity 5/5 Left: Upper extremity 5/5  Lower extremity 5/5 Lower extremity 5/5  Tone and bulk:normal tone throughout; no atrophy noted  Sensory: Pinprick and light touch intact throughout, bilaterally  Deep Tendon Reflexes:  Depressed throughout  Plantars:  Mute bilaterally  Cerebellar:  normal finger-to-nose, normal heel-to-shin test  Gait: not tested  ASSESSMENT [name] is a 79 y.o. female presenting with right facial droop, slurred speech and right arm drift. Status post IV t-PA.. Imaging pending. Infarct felt to be  embolic  secondary to holding Xarelto for surgical procedure.  On Xarelto prior to admission. Restarted Xarelto 9/20 after MRI shows no hemorrhage. Patient with resultant dysarthria and marked expressive aphasia. Work up underway.   Ischemic infarct likely 2/2 embolic event from A fib/discontinuation of Xarelto  S/p melanoma excision with wound vac in place  Hyperlipidemia  A fib  HbA1c of 6.2  Carotid doppler wnl  Hospital day # 2  TREATMENT/PLAN  Follow up MRI  Restarted Xarelto on 9/20. No antiPLT indicated as on Xarelto  Blood pressure control  Started atorvastatin 40  Rehab evaluation  Speech rec dysphagia diet, inpatient rehab  Echo completed, WNL  This patient is critically ill and at significant risk of neurological worsening, death and care requires constant monitoring of vital signs, hemodynamics,respiratory and cardiac monitoring,review of multiple databases, neurological assessment, discussion with family, other specialists and medical decision making of high complexity. I spent 35 minutes of neurocritical care time in the care of this patient.  Elspeth Cho, DO Neurology-Stroke

## 2013-05-19 NOTE — Evaluation (Signed)
Physical Therapy Evaluation Patient Details Name: Melanie Wood MRN: [id number] DOB: Sep 25, 1929 Today's Date: 05/19/2013 Time: 1030-1106 PT Time Calculation (min): 36 min  PT Assessment / Plan / Recommendation History of Present Illness  Melanie Wood is an 79 y.o. female with hx of A  Fib who recently was taken off her Xarelto for 4 days in order to have a melanoma removed. Today at 1115 she was noted to have right facial droop, slurred speech and right arm drift. [name] was brought to [facility] where code stroke was called. Patient was given tPA oat 1430 on 9/19 and transferred to [facility] hospital. On arrival patient was brought to 3100. Exam showed improved strength but patient remained dysarthric with expressive difficulties. She does have a wound vac on her left leg with a moderate amount of blood--general surgery has been consulted to see patient and make recommendations.    Clinical Impression  Pt admitted with the above. Pt currently with functional limitations due to the deficits listed below (see PT Problem List). Pt with expressive difficulties however following commands and able to get OOB.  Pt will benefit from skilled PT to increase their independence and safety with mobility to allow discharge to the venue listed below.      PT Assessment  Patient needs continued PT services    Follow Up Recommendations  CIR    Equipment Recommendations  Other (comment) (Will determine)    Recommendations for Other Services Rehab consult   Frequency Min 3X/week    Precautions / Restrictions Precautions Precautions: Fall;Other (comment) (pt has wound vac on left calf and JP drain at right groin) Restrictions Weight Bearing Restrictions: No   Pertinent Vitals/Pain C/o left calf and thigh pain rates 6/10; RN present and aware      Mobility  Bed Mobility Bed Mobility: Supine to Sit;Sitting - Scoot to Edge of Bed Supine to Sit: 3: Mod assist;With rails Sitting - Scoot to Edge  of Bed: 3: Mod assist;With rail Details for Bed Mobility Assistance: (A) to elevate trunk OOB with cues for technique.  Transfers Transfers: Sit to Stand;Stand to Sit Sit to Stand: 1: +2 Total assist;From bed Sit to Stand: Patient Percentage: 60% Stand to Sit: 1: +2 Total assist;To chair/3-in-1 Stand to Sit: Patient Percentage: 60% Stand Pivot Transfers: 1: +2 Total assist Stand Pivot Transfers: Patient Percentage: 70% Details for Transfer Assistance: +2 (A) to initiate transfer and slowly descend to recliner with max cues for technique ane to maintain balance due to posterior lean.  Pt able to take small shuffle steps to recliner however noticeable right knee buckling Ambulation/Gait Ambulation/Gait Assistance: Not tested (comment) Modified Rankin (Stroke Patients Only) Pre-Morbid Rankin Score: No significant disability Modified Rankin: Severe disability    Exercises     PT Diagnosis: Difficulty walking;Generalized weakness;Acute pain  PT Problem List: Decreased strength;Decreased activity tolerance;Decreased balance;Decreased mobility;Decreased range of motion;Decreased coordination;Decreased cognition;Decreased knowledge of use of DME;Pain PT Treatment Interventions: DME instruction;Gait training;Functional mobility training;Therapeutic activities;Therapeutic exercise;Balance training;Neuromuscular re-education;Cognitive remediation;Patient/family education     PT Goals(Current goals can be found in the care plan section) Acute Rehab PT Goals Patient Stated Goal: Family goals is for pt to go to inpatient rehab; pt did not set due to limited vocalization PT Goal Formulation: With patient/family Time For Goal Achievement: 05/26/13 Potential to Achieve Goals: Good  Visit Information  Last PT Received On: 05/19/13 Assistance Needed: +2 History of Present Illness: Melanie Wood is an 79 y.o. female with hx of A  Fib who recently was  taken off her Xarelto for 4 days in order to have  a melanoma removed. Today at 1115 she was noted to have right facial droop, slurred speech and right arm drift. [name] was brought to  regional where code stroke was called. Patient was given tPA oat 1430 on 9/19 and transferred to [facility] hospital. On arrival patient was brought to 3100. Exam showed improved strength but patient remained dysarthric with expressive difficulties. She does have a wound vac on her left leg with a moderate amount of blood--general surgery has been consulted to see patient and make recommendations.         Prior Functioning  Home Living Family/patient expects to be discharged to:: Assisted living Available Help at Discharge: Available 24 hours/day;Family Type of Home: Assisted living Home Equipment: Walker - 2 wheels Prior Function Level of Independence: Independent with assistive device(s) Comments: Ambulates to dining hall at ALF with RW and no assistance Communication Communication: Expressive difficulties Dominant Hand: Right    Cognition  Cognition Arousal/Alertness: Awake/alert Behavior During Therapy: Flat affect Overall Cognitive Status: Impaired/Different from baseline Area of Impairment: Problem solving;Attention Current Attention Level: Selective Problem Solving: Slow processing;Difficulty sequencing    Extremity/Trunk Assessment Lower Extremity Assessment Lower Extremity Assessment: RLE deficits/detail RLE Deficits / Details: At least 3/5 however noticeable right knee buckling during transfer LLE Deficits / Details: Pt with recent surgery to remove melanoma from left calf with skin graft on left thigh.  Cervical / Trunk Assessment Cervical / Trunk Assessment: Kyphotic (Pt having difficulty with head turns to right side. )   Balance Balance Balance Assessed: Yes Static Sitting Balance Static Sitting - Balance Support: Feet supported Static Sitting - Level of Assistance: 4: Min assist;5: Stand by assistance Static Sitting - Comment/# of  Minutes: Initial min (A) due to posterior lean and able to progress to stand by assistance with cues for proper midline and upright posture Static Standing Balance Static Standing - Balance Support: Bilateral upper extremity supported Static Standing - Level of Assistance: 1: +2 Total assist;Patient percentage (comment) (70%) Static Standing - Comment/# of Minutes: Pt with posterior lean in standing; pt needed tactile cues to promote midline and upright posture.   End of Session PT - End of Session Equipment Utilized During Treatment: Gait belt Activity Tolerance: Patient tolerated treatment well Patient left: in chair;with call bell/phone within reach Nurse Communication: Mobility status  GP     Kazimir Hartnett 05/19/2013, 1:16 PM  Jake Shark, PT DPT [phone]

## 2013-05-19 NOTE — Progress Notes (Signed)
Subjective: Can say her name  Objective: Vital signs in last 24 hours: Temp:  [98 F (36.7 C)-99.2 F (37.3 C)] 98 F (36.7 C) (09/21 0800) Pulse Rate:  [65-107] 65 (09/21 0800) Resp:  [7-18] 12 (09/21 0800) BP: (90-154)/(50-100) 114/56 mmHg (09/21 0800) SpO2:  [91 %-100 %] 100 % (09/21 0800) Last BM Date: 05/15/13  Intake/Output from previous day: 09/20 0701 - 09/21 0700 In: 1800 [I.V.:1800] Out: 1545 [Urine:1545] Intake/Output this shift:    Incision/Wound: VAC in place R calf, donor site with xeroform, mild output from JP  Lab Results:  No results found for this basename: WBC, HGB, HCT, PLT,  in the last 72 hours BMET No results found for this basename: NA, K, CL, CO2, GLUCOSE, BUN, CREATININE, CALCIUM,  in the last 72 hours PT/INR No results found for this basename: LABPROT, INR,  in the last 72 hours ABG No results found for this basename: PHART, PCO2, PO2, HCO3,  in the last 72 hours  Studies/Results: Mr Brain Wo Contrast  05/18/2013   CLINICAL DATA:  Stroke.  TPA given  EXAM: MRI HEAD WITHOUT CONTRAST  MRA HEAD WITHOUT CONTRAST  TECHNIQUE: Multiplanar, multiecho pulse sequences of the brain and surrounding structures were obtained without intravenous contrast. Angiographic images of the head were obtained using MRA technique without contrast.  COMPARISON:  None.  FINDINGS: MRI HEAD FINDINGS  Multiple areas of acute infarct bilaterally suggestive of cerebral emboli. The largest area of acute infarct is in the left posterior frontal cortex. Small areas of acute infarct in the frontal lobes bilaterally and in the left occipital lobe. Small acute infarcts in the right thalamus and right occipital lobe. No brainstem infarction.  Moderate atrophy with ventricular enlargement most likely related to the atrophy. Chronic microvascular ischemic change of a moderate degree throughout the white matter bilaterally.  Negative for hemorrhage or mass lesion.  MRA HEAD FINDINGS  Both  vertebral arteries are patent to the basilar. PICA patent bilaterally. Basilar widely patent. Superior cerebellar and posterior cerebral arteries widely patent. Fetal origin left posterior cerebral artery.  Right internal carotid artery widely patent. Right anterior and middle cerebral arteries widely patent.  Left internal carotid artery widely patent. Left anterior and middle cerebral artery is widely patent without significant stenosis.  Negative for cerebral aneurysm.  IMPRESSION: MRI HEAD IMPRESSION  Multiple areas of acute infarct in the cerebral hemispheres bilaterally consistent with cerebral emboli. Largest infarct in the left posterior frontal cortex. Generalized atrophy and chronic microvascular ischemic changes in the white matter.  MRA HEAD IMPRESSION  Negative intracranial MRA   Electronically Signed   By: Marlan Palau M.D.   On: 05/18/2013 16:06   Dg Chest Port 1 View  05/17/2013   CLINICAL DATA:  Stroke.  EXAM: PORTABLE CHEST - 1 VIEW  COMPARISON:  None.  FINDINGS: Mild cardiomegaly. Mild central peribronchial thickening. No focal infiltrate or overt edema. No effusion. Regional bones unremarkable.  IMPRESSION: Cardiomegaly without focal infiltrate   Electronically Signed   By: Oley Balm M.D.   On: 05/17/2013 23:29   Mr Maxine Glenn Head/brain Wo Cm  05/18/2013   CLINICAL DATA:  Stroke.  TPA given  EXAM: MRI HEAD WITHOUT CONTRAST  MRA HEAD WITHOUT CONTRAST  TECHNIQUE: Multiplanar, multiecho pulse sequences of the brain and surrounding structures were obtained without intravenous contrast. Angiographic images of the head were obtained using MRA technique without contrast.  COMPARISON:  None.  FINDINGS: MRI HEAD FINDINGS  Multiple areas of acute infarct bilaterally suggestive of  cerebral emboli. The largest area of acute infarct is in the left posterior frontal cortex. Small areas of acute infarct in the frontal lobes bilaterally and in the left occipital lobe. Small acute infarcts in the right  thalamus and right occipital lobe. No brainstem infarction.  Moderate atrophy with ventricular enlargement most likely related to the atrophy. Chronic microvascular ischemic change of a moderate degree throughout the white matter bilaterally.  Negative for hemorrhage or mass lesion.  MRA HEAD FINDINGS  Both vertebral arteries are patent to the basilar. PICA patent bilaterally. Basilar widely patent. Superior cerebellar and posterior cerebral arteries widely patent. Fetal origin left posterior cerebral artery.  Right internal carotid artery widely patent. Right anterior and middle cerebral arteries widely patent.  Left internal carotid artery widely patent. Left anterior and middle cerebral artery is widely patent without significant stenosis.  Negative for cerebral aneurysm.  IMPRESSION: MRI HEAD IMPRESSION  Multiple areas of acute infarct in the cerebral hemispheres bilaterally consistent with cerebral emboli. Largest infarct in the left posterior frontal cortex. Generalized atrophy and chronic microvascular ischemic changes in the white matter.  MRA HEAD IMPRESSION  Negative intracranial MRA   Electronically Signed   By: Marlan Palau M.D.   On: 05/18/2013 16:06    Anti-infectives: Anti-infectives   None      Assessment/Plan: S/P wide excision melanoma left calf with STSG and inguinal SLNBx - continue VAC to suction until 9/23, leave xeroform in place and change kerlix. We will follow  LOS: 2 days    Melanie Wood E 05/19/2013

## 2013-05-20 DIAGNOSIS — I6350 Cerebral infarction due to unspecified occlusion or stenosis of unspecified cerebral artery: Secondary | ICD-10-CM

## 2013-05-20 DIAGNOSIS — I6340 Cerebral infarction due to embolism of unspecified cerebral artery: Secondary | ICD-10-CM

## 2013-05-20 LAB — GLUCOSE, CAPILLARY
Glucose-Capillary: 163 mg/dL — ABNORMAL HIGH (ref 70–99)
Glucose-Capillary: 112 mg/dL — ABNORMAL HIGH (ref 70–99)
Glucose-Capillary: 105 mg/dL — ABNORMAL HIGH (ref 70–99)

## 2013-05-20 LAB — COMPREHENSIVE METABOLIC PANEL
Albumin: 2.6 g/dL — ABNORMAL LOW (ref 3.5–5.2)
Alkaline Phosphatase: 53 U/L (ref 39–117)
BUN: 8 mg/dL (ref 6–23)
Calcium: 8 mg/dL — ABNORMAL LOW (ref 8.4–10.5)
Chloride: 104 mEq/L (ref 96–112)
GFR calc Af Amer: >90 mL/min (ref >90)
Glucose, Bld: 109 mg/dL — ABNORMAL HIGH (ref 70–99)
Potassium: 3.4 mEq/L — ABNORMAL LOW (ref 3.5–5.1)
Total Bilirubin: 0.6 mg/dL (ref 0.3–1.2)
Total Protein: 5.6 g/dL — ABNORMAL LOW (ref 6.0–8.3)

## 2013-05-20 LAB — CBC WITH DIFFERENTIAL/PLATELET
Eosinophils Absolute: 0.1 10*3/uL (ref 0.0–0.7)
HCT: 34.6 % — ABNORMAL LOW (ref 36.0–46.0)
Hemoglobin: 10.9 g/dL — ABNORMAL LOW (ref 12.0–15.0)
Lymphs Abs: 1.3 10*3/uL (ref 0.7–4.0)
MCH: 29.4 pg (ref 26.0–34.0)
MCV: 93.3 fL (ref 78.0–100.0)
Monocytes Absolute: 0.8 10*3/uL (ref 0.1–1.0)
Monocytes Relative: 11 % (ref 3–12)
Neutro Abs: 5.1 10*3/uL (ref 1.7–7.7)
Neutrophils Relative %: 69 % (ref 43–77)
RBC: 3.71 MIL/uL — ABNORMAL LOW (ref 3.87–5.11)
WBC: 7.3 10*3/uL (ref 4.0–10.5)

## 2013-05-20 LAB — PATHOLOGY REPORT

## 2013-05-20 MED ORDER — RIVAROXABAN 15 MG PO TABS
15.0000 mg | ORAL_TABLET | Freq: Every day | ORAL | Status: DC
  Administered 2013-05-20 – 2013-05-22 (×3): 15 mg via ORAL
  Filled 2013-05-20 (×4): qty 1

## 2013-05-20 MED ORDER — PANTOPRAZOLE SODIUM 40 MG PO PACK
40.0000 mg | PACK | Freq: Every day | ORAL | Status: DC
  Administered 2013-05-20 – 2013-05-22 (×3): 40 mg via ORAL
  Filled 2013-05-20 (×5): qty 20

## 2013-05-20 NOTE — Progress Notes (Signed)
Physical Therapy Treatment Patient Details Name: Caleb Prigmore MRN: [id number] DOB: Mar 12, 1930 Today's Date: 05/20/2013 Time: 5284-1324 PT Time Calculation (min): 23 min  PT Assessment / Plan / Recommendation  History of Present Illness  79 y.o. female with hx of A Fib who recently was taken off her Xarelto for 4 days in order to have a melanoma removed. Today at 1115 she was noted to have right facial droop, slurred speech and right arm drift. [name] was brought to [facility] where code stroke was called. Patient was given tPA at 14:30 on 9/19 and transferred to [facility] hospital. On arrival patient was brought to 3100. Exam showed improved strength but patient remained dysarthric with expressive difficulties. She does have a wound vac on her left leg with a moderate amount of blood--general surgery has been consulted to see patient and make recommendations. Multiple areas of acute infarct in the cerebral hemispheres bilaterally consistent with cerebral emboli. Largest infarct in the left posterior frontal cortex.   PT Comments   Pt able to increase ambulation today with MOD to MIN A with RW and chair follow.  Pt with supportive family.  Follow Up Recommendations  CIR     Does the patient have the potential to tolerate intense rehabilitation   Yes  Barriers to Discharge        Equipment Recommendations   (family brought her RW in)    Recommendations for Other Services Rehab consult  Frequency Min 4X/week   Progress towards PT Goals Progress towards PT goals: Progressing toward goals  Plan Frequency needs to be updated;Current plan remains appropriate    Precautions / Restrictions Precautions Precautions: Fall;Other (comment) Restrictions Weight Bearing Restrictions: No   Pertinent Vitals/Pain Pt reports neck pain.    Mobility  Bed Mobility Bed Mobility: Not assessed Transfers Transfers: Sit to Stand;Stand to Sit Sit to Stand: 3: Mod assist;With upper extremity  assist;From chair/3-in-1 Stand to Sit: 3: Mod assist;With upper extremity assist;To chair/3-in-1 Details for Transfer Assistance: Cues for hand placement and poorly controlled descent. Ambulation/Gait Ambulation/Gait Assistance: 3: Mod assist;4: Min assist Ambulation Distance (Feet): 26 Feet (26 and 58) Assistive device: 4-wheeled walker (chair follow) Ambulation/Gait Assistance Details: Pt with occasional slight  R knee buckeling, but not bad enought to require A from PT to maintain upright position. Gait Pattern: Decreased step length - right;Decreased step length - left Modified Rankin (Stroke Patients Only) Pre-Morbid Rankin Score: No significant disability Modified Rankin: Moderately severe disability    Exercises     PT Diagnosis:    PT Problem List:   PT Treatment Interventions:     PT Goals (current goals can now be found in the care plan section) Acute Rehab PT Goals Potential to Achieve Goals: Good  Visit Information  Last PT Received On: 05/20/13 Assistance Needed: +1 (chair pushed by daughter.) History of Present Illness:  79 y.o. female with hx of A Fib who recently was taken off her Xarelto for 4 days in order to have a melanoma removed. Today at 1115 she was noted to have right facial droop, slurred speech and right arm drift. [name] was brought to [facility] where code stroke was called. Patient was given tPA at 14:30 on 9/19 and transferred to [facility] hospital. On arrival patient was brought to 3100. Exam showed improved strength but patient remained dysarthric with expressive difficulties. She does have a wound vac on her left leg with a moderate amount of blood--general surgery has been consulted to see patient and make recommendations. Multiple  areas of acute infarct in the cerebral hemispheres bilaterally consistent with cerebral emboli. Largest infarct in the left posterior frontal cortex.    Subjective Data      Cognition  Cognition Arousal/Alertness:  Awake/alert Behavior During Therapy: Flat affect Overall Cognitive Status: Impaired/Different from baseline Area of Impairment: Safety/judgement Safety/Judgement: Decreased awareness of safety;Decreased awareness of deficits Problem Solving: Slow processing General Comments: Pt using white board to communicate.    Balance  Static Standing Balance Static Standing - Balance Support: Bilateral upper extremity supported;During functional activity Static Standing - Level of Assistance: 3: Mod assist  End of Session PT - End of Session Equipment Utilized During Treatment: Gait belt Activity Tolerance: Patient tolerated treatment well Patient left: in chair;with call bell/phone within reach;with family/visitor present Nurse Communication: Mobility status   GP     Lashana Spang LUBECK 05/20/2013, 11:58 AM

## 2013-05-20 NOTE — Progress Notes (Signed)
Rehab Admissions Coordinator Note:  Patient was screened by Brock Ra for appropriateness for an Inpatient Acute Rehab Consult.  At this time, we are recommending Inpatient Rehab consult.  Brock Ra 05/20/2013, 8:51 AM  I can be reached at [phone].

## 2013-05-20 NOTE — Progress Notes (Signed)
Stroke Team Progress Note  HISTORY Melanie Wood is an 79 y.o. female with hx of A  Fib who recently was taken off her Xarelto for 4 days in order to have a melanoma removed. Today at 1115 she was noted to have right facial droop, slurred speech and right arm drift. [name] was brought to [facility] where code stroke was called. Patient was given tPA oat 1430 on 9/19 and transferred to [facility] hospital. On arrival patient was brought to 3100. Exam showed improved strength but patient remained dysarthric with expressive difficulties. She does have a wound vac on her left leg with a moderate amount of blood--general surgery has been consulted to see patient and make recommendations.   She was admitted to the neuro ICU  for further evaluation and treatment.  The patient's hospitalist apparently called the patient's neurologists, Dr. Sherryll Burger, and discussed tPA and it was decided to give tPA per family.   SUBJECTIVE Her daughter is at the bedside.  Overall she feels her condition is stable.   OBJECTIVE Most recent Vital Signs: Filed Vitals:   05/20/13 0200 05/20/13 0543 05/20/13 0759 05/20/13 0949  BP: 130/89 125/77  106/65  Pulse: 78 100  77  Temp: 98.2 F (36.8 C) 98.5 F (36.9 C)  97.7 F (36.5 C)  TempSrc: Oral Oral  Oral  Resp: 18 20  18   Height:      Weight:      SpO2: 96% 96% 98% 98%   CBG (last 3)   Recent Labs  05/19/13 1744 05/19/13 2141 05/20/13 0639  GLUCAP 100* 149* 103*    IV Fluid Intake:   . sodium chloride 75 mL/hr at 05/19/13 0232    MEDICATIONS  . antiseptic oral rinse  15 mL Mouth Rinse BID  . atorvastatin  40 mg Oral q1800  . insulin aspart  0-9 Units Subcutaneous TID WC  . mometasone-formoterol  2 puff Inhalation BID  . pantoprazole (PROTONIX) IV  40 mg Intravenous QHS  . tiotropium  18 mcg Inhalation Daily   PRN:  acetaminophen, acetaminophen, albuterol, HYDROmorphone (DILAUDID) injection, ipratropium, labetalol, senna-docusate  Diet:  Dysphagia   Activity:  ambulate DVT Prophylaxis: compression devices  CLINICALLY SIGNIFICANT STUDIES Basic Metabolic Panel:   Recent Labs Lab 05/20/13 0702  NA 139  K 3.4*  CL 104  CO2 27  GLUCOSE 109*  BUN 8  CREATININE 0.37*  CALCIUM 8.0*   Liver Function Tests:   Recent Labs Lab 05/20/13 0702  AST 20  ALT 12  ALKPHOS 53  BILITOT 0.6  PROT 5.6*  ALBUMIN 2.6*   CBC:   Recent Labs Lab 05/20/13 0702  WBC 7.3  NEUTROABS 5.1  HGB 10.9*  HCT 34.6*  MCV 93.3  PLT 185   Coagulation: No results found for this basename: LABPROT, INR,  in the last 168 hours Cardiac Enzymes: No results found for this basename: CKTOTAL, CKMB, CKMBINDEX, TROPONINI,  in the last 168 hours Urinalysis: No results found for this basename: COLORURINE, APPERANCEUR, LABSPEC, PHURINE, GLUCOSEU, HGBUR, BILIRUBINUR, KETONESUR, PROTEINUR, UROBILINOGEN, NITRITE, LEUKOCYTESUR,  in the last 168 hours Lipid Panel    Component Value Date/Time   CHOL 216* 05/18/2013 0430   TRIG 155* 05/18/2013 0430   HDL 71 05/18/2013 0430   CHOLHDL 3.0 05/18/2013 0430   VLDL 31 05/18/2013 0430   LDLCALC 114* 05/18/2013 0430   HgbA1C  Lab Results  Component Value Date   HGBA1C 6.2* 05/18/2013    Urine Drug Screen:   No results found  for this basename: labopia,  cocainscrnur,  labbenz,  amphetmu,  thcu,  labbarb    Alcohol Level: No results found for this basename: ETH,  in the last 168 hours    CT of the brain    MRI of the brain   9/20 MRI HEAD IMPRESSION  Multiple areas of acute infarct in the cerebral hemispheres  bilaterally consistent with cerebral emboli. Largest infarct in the  left posterior frontal cortex. Generalized atrophy and chronic  microvascular ischemic changes in the white matter.   MRA of the brain   9/20 MRA HEAD IMPRESSION  Negative intracranial MRA  2D Echocardiogram EF 55%, wall  Motion normal, left atrium mildly dilated. No ASD or PFO identified.   Carotid Doppler: Preliminary report:  Bilateral: 1-39% ICA stenosis. Vertebral artery flow is antegrade.   CXR    Therapy Recommendations CIR  Physical Exam   Gen:NAD HEENT- Normocephalic, no lesions, without obvious abnormality. Normal external eye and conjunctiva. Normal TM's bilaterally. Normal auditory canals and external ears. Normal external nose, mucus membranes and septum. Normal pharynx.  Neck supple with no masses, nodes, nodules or enlargement.  Cardiovascular - regular rate and rhythm, S1, S2 normal, no murmur, click, rub or gallop  Lungs - chest clear, no wheezing, rales, normal symmetric air entry, Heart exam - S1, S2 normal, no murmur, no gallop, rate regular  Abdomen - soft, non-tender; bowel sounds normal; no masses, no organomegaly  Extremities - no edema  Neurologic Examination:  Mental Status:  Alert, markedly dysarthric with expressive aphasia. Able to follow 3 step commands without difficulty.  Cranial Nerves:  II: Discs flat bilaterally; Visual fields grossly normal, pupils equal, round, reactive to light and accommodation  III,IV, VI: ptosis not present, extra-ocular motions intact bilaterally  V,VII: right facial droop, facial light touch sensation normal bilaterally  VIII: hearing normal bilaterally  IX,X: gag reflex present  XI: bilateral shoulder shrug  XII: midline tongue extension  Motor:  Right : Upper extremity 5/5 Left: Upper extremity 5/5  Lower extremity 5/5 Lower extremity 5/5  Tone and bulk:normal tone throughout; no atrophy noted  Sensory: Pinprick and light touch intact throughout, bilaterally  Deep Tendon Reflexes:  Depressed throughout  Plantars:  Mute bilaterally  Cerebellar:  normal finger-to-nose, normal heel-to-shin test  Gait: not tested  ASSESSMENT [name] is a 79 y.o. female presenting with right facial droop, slurred speech and right arm drift. Status post IV t-PA.. Imaging pending. Infarct felt to be  embolic secondary to holding Xarelto for surgical  procedure.  On Xarelto prior to admission. Restarted Xarelto 9/20 after MRI shows no hemorrhage. Patient with resultant dysarthria and marked expressive aphasia.    Ischemic infarct likely 2/2 embolic event from A fib/discontinuation of Xarelto  S/p melanoma excision with wound vac in place  Hyperlipidemia, LDL 114, statin started  A fib  HbA1c of 6.2   Hospital day # 3  TREATMENT/PLAN  Restarted Xarelto on 9/20. No antiPLT indicated as on Xarelto  Risk factor modification  To CIR, plan for am  Patient to follow up with Dr. Pearlean Brownie in 2 months.  Gwendolyn Lima. Manson Passey, [facility], MBA, MHA [facility] Pager: [phone] 05/20/2013 10:25 PM  I have personally obtained a history, examined the patient, evaluated imaging results, and formulated the assessment and plan of care. I agree with the above.  Delia Heady, MD

## 2013-05-20 NOTE — Consult Note (Signed)
Physical Medicine and Rehabilitation Consult  Reason for Consult: Expressive deficits, right facial weakness, RLE weakness. Referring Physician:  Dr. Pearlean Brownie   HPI: Melanie Wood is a 79 y.o. RH-female with history of A fib, HTN, who was taken off Xarelto to have melanoma removed. On 05/17/13, she was admitted with slurred speech with expressive difficulties, right facial droop and RUE weakness. MRI/MRA brain showed acute bilateral cerebral infarcts in bilateral frontal lobes (-largest in left posterior), Bilateral occipital and rigth thalamus; no stenosis. 2D echo with EF 55-60% and no wall abnormality. Neurology consulted and recommended resuming Xarelto for embolic stroke due to A Fib. CCS consulted for input on VAC and dressing change to be done tomorrow. Patient continues with expressive deficits as well as reading deficits with suspicion of visual deficits, right sided weakness as well as problems with mobility. Therapies ongoing and CIR recommended by team.    Review of Systems  Unable to perform ROS: language  Eyes: Negative for blurred vision.   Past Medical History  Diagnosis Date  . Dementia   . A-fib   . Hypertension    Past Surgical History  Procedure Laterality Date  . Skin graft    . Skin surgery     History reviewed. No pertinent family history. Social History:  reports that she quit smoking about 25 years ago. Her smoking use included Cigarettes. She smoked 0.00 packs per day. She does not have any smokeless tobacco history on file. Her alcohol and drug histories are not on file. Allergies:  Allergies  Allergen Reactions  . Codeine Other (See Comments)    GI distress  . Shellfish Allergy Diarrhea and Nausea And Vomiting  . Penicillins Itching and Rash   Medications Prior to Admission  Medication Sig Dispense Refill  . Acetaminophen-Codeine (TYLENOL/CODEINE #3) 300-30 MG per tablet Take 1-2 tablets by mouth every 4 (four) hours as needed for pain.      Marland Kitchen aspirin 81  MG tablet Take 81 mg by mouth daily.      . benzonatate (TESSALON) 100 MG capsule Take 100 mg by mouth 3 (three) times daily as needed for cough.      . carvedilol (COREG) 6.25 MG tablet Take 6.25 mg by mouth 2 (two) times daily with a meal.      . donepezil (ARICEPT) 10 MG tablet Take 10 mg by mouth at bedtime.       . Fluticasone-Salmeterol (ADVAIR) 100-50 MCG/DOSE AEPB Inhale 1 puff into the lungs every 12 (twelve) hours.      . furosemide (LASIX) 20 MG tablet Take 20 mg by mouth daily.       Marland Kitchen ipratropium-albuterol (DUONEB) 0.5-2.5 (3) MG/3ML SOLN Take 3 mLs by nebulization every 8 (eight) hours as needed.      Marland Kitchen L-Methylfolate-Algae-B12-B6 (METANX) 3-90.314-2-35 MG CAPS Take 1 tablet by mouth daily.      Marland Kitchen lisinopril (PRINIVIL,ZESTRIL) 2.5 MG tablet Take 1.25 mg by mouth daily.      Marland Kitchen lisinopril-hydrochlorothiazide (PRINZIDE,ZESTORETIC) 20-12.5 MG per tablet Take 1 tablet by mouth daily.      Marland Kitchen nystatin (MYCOSTATIN/NYSTOP) 100000 UNIT/GM POWD Apply 1 g topically 3 (three) times daily.      . pantoprazole (PROTONIX) 40 MG injection Inject 40 mg into the vein as needed (for GERD).      Marland Kitchen potassium chloride SA (K-DUR,KLOR-CON) 20 MEQ tablet Take 10 mEq by mouth daily.      . Rivaroxaban (XARELTO) 15 MG TABS tablet Take 15 mg by mouth daily.      Marland Kitchen  tiotropium (SPIRIVA) 18 MCG inhalation capsule Place 18 mcg into inhaler and inhale daily.        Home: Home Living Family/patient expects to be discharged to:: Assisted living Available Help at Discharge: Available 24 hours/day;Family Type of Home: Assisted living Home Equipment: Walker - 2 wheels  Functional History: Prior Function Vocation: Retired Comments: Ambulates to dining hall at ALF with RW and no assistance Functional Status:  Mobility: Bed Mobility Bed Mobility: Not assessed Supine to Sit: 3: Mod assist;With rails Sitting - Scoot to [place] of Bed: 3: Mod assist;With rail Transfers Transfers: Sit to Stand;Stand to [employer] to  Stand: 3: Mod assist;With upper extremity assist;From chair/3-in-1 Sit to Stand: Patient Percentage: 60% Stand to Sit: 3: Mod assist;With upper extremity assist;To chair/3-in-1 Stand to Sit: Patient Percentage: 60% Stand Pivot Transfers: 1: +2 Total assist Stand Pivot Transfers: Patient Percentage: 70% Ambulation/Gait Ambulation/Gait Assistance: 3: Mod assist;4: Min assist Ambulation Distance (Feet): 26 Feet (26 and 58) Assistive device: 4-wheeled walker (chair follow) Ambulation/Gait Assistance Details: Pt with occasional slight  R knee buckeling, but not bad enought to require A from PT to maintain upright position. Gait Pattern: Decreased step length - right;Decreased step length - left    ADL: ADL Eating/Feeding: Set up Where Assessed - Eating/Feeding: Chair Lower Body Dressing: Maximal assistance (don panties over wound vac and jp drain) Where Assessed - Lower Body Dressing: Supported sit to Pharmacist, hospital: Moderate assistance Toilet Transfer Method: Sit to stand Toilet Transfer Equipment: Raised toilet seat with arms (or 3-in-1 over toilet) Equipment Used: Rolling walker;Gait belt Transfers/Ambulation Related to ADLs: Pt ambulated with (A) to progress RW. pt needed cues to keep Rw from running into walker on the right side.  ADL Comments: Pt in chair on arrival with daughter present. Daughter is PTA at [employer]. Patient is a retired Charity fundraiser. pt using dry erase board to write request. Pt asked to write name pt writes "[name]" Pt laughs and rewrites last name. pt with additional letters and missing letters during written communication. Pt writes "tech" when trying to participate in conversation to ask PT if she went to [place] tech. Very appropriate question and response.Pt don underwear this session. Pt placed pad in underwear and total (A) to don bil LEs into panties. Pt able to hold pull up panites at calf. Pt unable to pull panties completely up and requires BIL  UE on RW for support in static standing. Pt with incr ambulation this session.   Cognition: Cognition Overall Cognitive Status: Impaired/Different from baseline Arousal/Alertness: Awake/alert Orientation Level: Oriented X4 Attention: Sustained Sustained Attention: Impaired Sustained Attention Impairment: Verbal complex;Verbal basic;Functional basic Memory: Appears intact Awareness: Appears intact Problem Solving: Impaired Problem Solving Impairment: Functional basic Behaviors: Restless Cognition Arousal/Alertness: Awake/alert Behavior During Therapy: Flat affect Overall Cognitive Status: Impaired/Different from baseline Area of Impairment: Safety/judgement Current Attention Level: Selective Safety/Judgement: Decreased awareness of safety;Decreased awareness of deficits Problem Solving: Slow processing General Comments: Pt using white board to communicate.  Blood pressure 106/65, pulse 77, temperature 97.7 F (36.5 C), temperature source Oral, resp. rate 18, height 5\' 2"  (1.575 m), weight 82.2 kg (181 lb 3.5 oz), SpO2 98.00%.  Physical Exam  Nursing note and vitals reviewed. Constitutional: She is oriented to person, place, and time. She appears well-developed and well-nourished.  HENT:  Head: Normocephalic and atraumatic.  Eyes: Conjunctivae are normal. Pupils are equal, round, and reactive to light.  Neck: Normal range of motion. Neck supple.  Cardiovascular: Normal rate and regular rhythm.  Pulmonary/Chest: Effort normal and breath sounds normal. No respiratory distress. She has no wheezes.  Abdominal: Soft. Bowel sounds are normal. She exhibits no distension.  Musculoskeletal:  VAC in place on left shin, left donor site dressed, areas appropriately tender.  Neurological: She is alert and oriented to person, place, and time.  Expressive deficits but able to answer basic questions with some hesitation. Used dry erase board to write but words jumbled and broken. Decreased  coordination with FMM left greater than right. Speech is dysarthric. Had more difficulties with vision to the outer left fields.  Easily frustrated but able to follow one and two steps commands without difficulty.   Skin: Skin is warm and dry.  Psychiatric: She has a normal mood and affect. Her behavior is normal.    Results for orders placed during the hospital encounter of 05/17/13 (from the past 24 hour(s))  GLUCOSE, CAPILLARY     Status: Abnormal   Collection Time    05/19/13  5:44 PM      Result Value Range   Glucose-Capillary 100 (*) 70 - 99 mg/dL  GLUCOSE, CAPILLARY     Status: Abnormal   Collection Time    05/19/13  9:41 PM      Result Value Range   Glucose-Capillary 149 (*) 70 - 99 mg/dL  GLUCOSE, CAPILLARY     Status: Abnormal   Collection Time    05/20/13  6:39 AM      Result Value Range   Glucose-Capillary 103 (*) 70 - 99 mg/dL  CBC WITH DIFFERENTIAL     Status: Abnormal   Collection Time    05/20/13  7:02 AM      Result Value Range   WBC 7.3  4.0 - 10.5 K/uL   RBC 3.71 (*) 3.87 - 5.11 MIL/uL   Hemoglobin 10.9 (*) 12.0 - 15.0 g/dL   HCT 78.4 (*) 69.6 - 29.5 %   MCV 93.3  78.0 - 100.0 fL   MCH 29.4  26.0 - 34.0 pg   MCHC 31.5  30.0 - 36.0 g/dL   RDW 28.4  13.2 - 44.0 %   Platelets 185  150 - 400 K/uL   Neutrophils Relative % 69  43 - 77 %   Neutro Abs 5.1  1.7 - 7.7 K/uL   Lymphocytes Relative 17  12 - 46 %   Lymphs Abs 1.3  0.7 - 4.0 K/uL   Monocytes Relative 11  3 - 12 %   Monocytes Absolute 0.8  0.1 - 1.0 K/uL   Eosinophils Relative 2  0 - 5 %   Eosinophils Absolute 0.1  0.0 - 0.7 K/uL   Basophils Relative 0  0 - 1 %   Basophils Absolute 0.0  0.0 - 0.1 K/uL  COMPREHENSIVE METABOLIC PANEL     Status: Abnormal   Collection Time    05/20/13  7:02 AM      Result Value Range   Sodium 139  135 - 145 mEq/L   Potassium 3.4 (*) 3.5 - 5.1 mEq/L   Chloride 104  96 - 112 mEq/L   CO2 27  19 - 32 mEq/L   Glucose, Bld 109 (*) 70 - 99 mg/dL   BUN 8  6 - 23 mg/dL    Creatinine, Ser 1.02 (*) 0.50 - 1.10 mg/dL   Calcium 8.0 (*) 8.4 - 10.5 mg/dL   Total Protein 5.6 (*) 6.0 - 8.3 g/dL   Albumin 2.6 (*) 3.5 - 5.2 g/dL   AST 20  0 - 37 U/L   ALT 12  0 - 35 U/L   Alkaline Phosphatase 53  39 - 117 U/L   Total Bilirubin 0.6  0.3 - 1.2 mg/dL   GFR calc non Af Amer >90  >90 mL/min   GFR calc Af Amer >90  >90 mL/min  GLUCOSE, CAPILLARY     Status: Abnormal   Collection Time    05/20/13 11:49 AM      Result Value Range   Glucose-Capillary 163 (*) 70 - 99 mg/dL   Mr Brain Wo Contrast  05/18/2013   CLINICAL DATA:  Stroke.  TPA given  EXAM: MRI HEAD WITHOUT CONTRAST  MRA HEAD WITHOUT CONTRAST  TECHNIQUE: Multiplanar, multiecho pulse sequences of the brain and surrounding structures were obtained without intravenous contrast. Angiographic images of the head were obtained using MRA technique without contrast.  COMPARISON:  None.  FINDINGS: MRI HEAD FINDINGS  Multiple areas of acute infarct bilaterally suggestive of cerebral emboli. The largest area of acute infarct is in the left posterior frontal cortex. Small areas of acute infarct in the frontal lobes bilaterally and in the left occipital lobe. Small acute infarcts in the right thalamus and right occipital lobe. No brainstem infarction.  Moderate atrophy with ventricular enlargement most likely related to the atrophy. Chronic microvascular ischemic change of a moderate degree throughout the white matter bilaterally.  Negative for hemorrhage or mass lesion.  MRA HEAD FINDINGS  Both vertebral arteries are patent to the basilar. PICA patent bilaterally. Basilar widely patent. Superior cerebellar and posterior cerebral arteries widely patent. Fetal origin left posterior cerebral artery.  Right internal carotid artery widely patent. Right anterior and middle cerebral arteries widely patent.  Left internal carotid artery widely patent. Left anterior and middle cerebral artery is widely patent without significant stenosis.  Negative  for cerebral aneurysm.  IMPRESSION: MRI HEAD IMPRESSION  Multiple areas of acute infarct in the cerebral hemispheres bilaterally consistent with cerebral emboli. Largest infarct in the left posterior frontal cortex. Generalized atrophy and chronic microvascular ischemic changes in the white matter.  MRA HEAD IMPRESSION  Negative intracranial MRA   Electronically Signed   By: Marlan Palau M.D.   On: 05/18/2013 16:06   Mr Maxine Glenn Head/brain Wo Cm  05/18/2013   CLINICAL DATA:  Stroke.  TPA given  EXAM: MRI HEAD WITHOUT CONTRAST  MRA HEAD WITHOUT CONTRAST  TECHNIQUE: Multiplanar, multiecho pulse sequences of the brain and surrounding structures were obtained without intravenous contrast. Angiographic images of the head were obtained using MRA technique without contrast.  COMPARISON:  None.  FINDINGS: MRI HEAD FINDINGS  Multiple areas of acute infarct bilaterally suggestive of cerebral emboli. The largest area of acute infarct is in the left posterior frontal cortex. Small areas of acute infarct in the frontal lobes bilaterally and in the left occipital lobe. Small acute infarcts in the right thalamus and right occipital lobe. No brainstem infarction.  Moderate atrophy with ventricular enlargement most likely related to the atrophy. Chronic microvascular ischemic change of a moderate degree throughout the white matter bilaterally.  Negative for hemorrhage or mass lesion.  MRA HEAD FINDINGS  Both vertebral arteries are patent to the basilar. PICA patent bilaterally. Basilar widely patent. Superior cerebellar and posterior cerebral arteries widely patent. Fetal origin left posterior cerebral artery.  Right internal carotid artery widely patent. Right anterior and middle cerebral arteries widely patent.  Left internal carotid artery widely patent. Left anterior and middle cerebral artery is widely patent without significant stenosis.  Negative  for cerebral aneurysm.  IMPRESSION: MRI HEAD IMPRESSION  Multiple areas of acute  infarct in the cerebral hemispheres bilaterally consistent with cerebral emboli. Largest infarct in the left posterior frontal cortex. Generalized atrophy and chronic microvascular ischemic changes in the white matter.  MRA HEAD IMPRESSION  Negative intracranial MRA   Electronically Signed   By: Marlan Palau M.D.   On: 05/18/2013 16:06    Assessment/Plan: Diagnosis: embolic bicerebral infarcts 1. Does the need for close, 24 hr/day medical supervision in concert with the patient's rehab needs make it unreasonable for this patient to be served in a less intensive setting? Yes 2. Co-Morbidities requiring supervision/potential complications: afib, left leg wound 3. Due to bladder management, bowel management, safety, skin/wound care, disease management, medication administration, pain management and patient education, does the patient require 24 hr/day rehab nursing? Yes 4. Does the patient require coordinated care of a physician, rehab nurse, PT (1-2 hrs/day, 5 days/week), OT (1-2 hrs/day, 5 days/week) and SLP (1-2 hrs/day, 5 days/week) to address physical and functional deficits in the context of the above medical diagnosis(es)? Yes Addressing deficits in the following areas: balance, endurance, locomotion, strength, transferring, bowel/bladder control, bathing, dressing, feeding, grooming, toileting, cognition, speech, language, swallowing and psychosocial support 5. Can the patient actively participate in an intensive therapy program of at least 3 hrs of therapy per day at least 5 days per week? Yes 6. The potential for patient to make measurable gains while on inpatient rehab is excellent 7. Anticipated functional outcomes upon discharge from inpatient rehab are supervision to min assist with PT, supervision to min assist with OT, supervision to minimal assist with SLP. 8. Estimated rehab length of stay to reach the above functional goals is: 3 weeks 9. Does the patient have adequate social supports  to accommodate these discharge functional goals? Yes and Potentially 10. Anticipated D/C setting: Home 11. Anticipated post D/C treatments: HH therapy 12. Overall Rehab/Functional Prognosis: excellent  RECOMMENDATIONS: This patient's condition is appropriate for continued rehabilitative care in the following setting: CIR Patient has agreed to participate in recommended program. Yes Note that insurance prior authorization may be required for reimbursement for recommended care.  Comment: The patient would benefit from our comprehensive program to improve her functional mobility and language/swallowing. I believe she could reach a level where the ALF could "make up the difference", and she could return to her apartment. Rehab RN to follow up.   Ranelle Oyster, MD, [place] [facility]     05/20/2013

## 2013-05-20 NOTE — Progress Notes (Signed)
Pt with primary MD when rounded. Discussed with PA. Plan wound vac Tuesday. Wound care consult  Mary Sella. Andrey Campanile, MD, FACS General, Bariatric, & Minimally Invasive Surgery [facility], [place]

## 2013-05-20 NOTE — Evaluation (Signed)
Occupational Therapy Evaluation Patient Details Name: Melanie Wood MRN: [id number] DOB: 1930/02/29 Today's Date: 05/20/2013 Time: [phone] (time reflects eval and tx ) OT Time Calculation (min): 33 min  OT Assessment / Plan / Recommendation History of present illness  79 y.o. female with hx of A Fib who recently was taken off her Xarelto for 4 days in order to have a melanoma removed. Today at 1115 she was noted to have right facial droop, slurred speech and right arm drift. [name] was brought to [facility] where code stroke was called. Patient was given tPA at 14:30 on 9/19 and transferred to [facility] hospital. On arrival patient was brought to 3100. Exam showed improved strength but patient remained dysarthric with expressive difficulties. She does have a wound vac on her left leg with a moderate amount of blood--general surgery has been consulted to see patient and make recommendations. Multiple areas of acute infarct in the cerebral hemispheres bilaterally consistent with cerebral emboli. Largest infarct in the left posterior frontal cortex.   Clinical Impression   PT admitted with rt facial droop and right UE weakness. Pt currently with functional limitiations due to the deficits listed below (see OT problem list).  Pt will benefit from skilled OT to increase their independence and safety with adls and balance to allow discharge CIR.     OT Assessment  Patient needs continued OT Services    Follow Up Recommendations  CIR    Barriers to Discharge      Equipment Recommendations  3 in 1 bedside comode    Recommendations for Other Services Rehab consult  Frequency  Min 2X/week    Precautions / Restrictions Precautions Precautions: Fall;Other (comment) (wound vac left calf, JP drain Rt groin) Restrictions Weight Bearing Restrictions: No   Pertinent Vitals/Pain No pain no dizziness     ADL  Eating/Feeding: Set up Where Assessed - Eating/Feeding: Chair Lower Body Dressing:  Maximal assistance (don panties over wound vac and jp drain) Where Assessed - Lower Body Dressing: Supported sit to Pharmacist, hospital: Moderate assistance Toilet Transfer Method: Sit to stand Toilet Transfer Equipment: Raised toilet seat with arms (or 3-in-1 over toilet) Toileting - Clothing Manipulation and Hygiene: Maximal assistance Where Assessed - Toileting Clothing Manipulation and Hygiene: Sit to stand from 3-in-1 or toilet Equipment Used: Rolling walker;Gait belt Transfers/Ambulation Related to ADLs: Pt ambulated with (A) to progress RW. pt needed cues to keep Rw from running into walker on the right side.  ADL Comments: Pt in chair on arrival with daughter present. Daughter is PTA at [employer]. Patient is a retired Char[employer] fundraiser. pt using dry erase board to write request. Pt asked to write name pt writes "Melanie Wood" Pt laughs and rewrites last name. pt with additional letters and missing letters during written communication. Pt writes "tech" when trying to participate in conversation to ask PT if she went to [place] tech. Very appropriate question and response.Pt don underwear this session. Pt placed pad in underwear and total (A) to don bil LEs into panties. Pt able to hold pull up panites at calf. Pt unable to pull panties completely up and requires BIL UE on RW for support in static standing. Pt with incr ambulation this session.     OT Diagnosis: Generalized weakness;Disturbance of vision  OT Problem List: Decreased strength;Decreased activity tolerance;Impaired balance (sitting and/or standing);Decreased safety awareness;Decreased knowledge of use of DME or AE;Decreased knowledge of precautions;Impaired vision/perception OT Treatment Interventions: Self-care/ADL training;Therapeutic exercise;DME and/or AE instruction;Therapeutic activities;Patient/family education;Balance training;Visual/perceptual remediation/compensation  OT Goals(Current goals can be found in the care plan  section) Acute Rehab OT Goals OT Goal Formulation: With patient Time For Goal Achievement: 06/03/13 Potential to Achieve Goals: Good  Visit Information  Last OT Received On: 05/20/13 Assistance Needed: +1 (chair follow by floor RN/ tech) PT/OT Co-Evaluation/Treatment: Yes History of Present Illness:  79 y.o. female with hx of A Fib who recently was taken off her Xarelto for 4 days in order to have a melanoma removed. Today at 1115 she was noted to have right facial droop, slurred speech and right arm drift. [name] was brought to [facility] where code stroke was called. Patient was given tPA at 14:30 on 9/19 and transferred to [facility] hospital. On arrival patient was brought to 3100. Exam showed improved strength but patient remained dysarthric with expressive difficulties. She does have a wound vac on her left leg with a moderate amount of blood--general surgery has been consulted to see patient and make recommendations. Multiple areas of acute infarct in the cerebral hemispheres bilaterally consistent with cerebral emboli. Largest infarct in the left posterior frontal cortex.       Prior Functioning     Home Living Family/patient expects to be discharged to:: Assisted living Available Help at Discharge: Available 24 hours/day;Family Type of Home: Assisted living Home Equipment: Walker - 2 wheels Prior Function Level of Independence: Independent with assistive device(s) Comments: Ambulates to dining hall at ALF with RW and no assistance Communication Communication: Expressive difficulties Dominant Hand: Right         Vision/Perception Vision - History Baseline Vision: Wears glasses all the time Patient Visual Report: Blurring of vision Vision - Assessment Vision Assessment: Vision tested Ocular Range of Motion: Within Functional Limits Tracking/Visual Pursuits: Impaired - to be further tested in functional context;Requires cues, head turns, or add eye shifts to  track Additional Comments: Pt provided letter cancellation and marking letters overshooting (placing X above the actual letter) Pt reports with left eye occluded right eye blurred vision in right peripheral vision. Pt reports bil eye superior visual changes. Pt has expressive aphasia and demonstrates deficits with writing responses.   Cognition  Cognition Arousal/Alertness: Awake/alert Behavior During Therapy: Flat affect Overall Cognitive Status: Impaired/Different from baseline Area of Impairment: Safety/judgement Safety/Judgement: Decreased awareness of safety;Decreased awareness of deficits Problem Solving: Slow processing    Extremity/Trunk Assessment Upper Extremity Assessment Upper Extremity Assessment: LUE deficits/detail LUE Deficits / Details: AROM ~60 degress, with AAROM WFL shoulder flexion, Pt unable to maintain full 180 degrees shoulder flexion Cervical / Trunk Assessment Cervical / Trunk Assessment: Kyphotic     Mobility Bed Mobility Bed Mobility: Not assessed Transfers Transfers: Sit to Stand;Stand to Sit Sit to Stand: 3: Mod assist;With upper extremity assist;From chair/3-in-1 Stand to Sit: 3: Mod assist;With upper extremity assist;To chair/3-in-1 Details for Transfer Assistance: cues for hand placement and safety with RW. Pt with uncontrolled descend to chair     Exercise     Balance Static Standing Balance Static Standing - Balance Support: Bilateral upper extremity supported;During functional activity Static Standing - Level of Assistance: 3: Mod assist   End of Session OT - End of Session Activity Tolerance: Patient tolerated treatment well Patient left: in chair;with call bell/phone within reach Nurse Communication: Mobility status;Precautions  GO     Harolyn Rutherford 05/20/2013, 11:10 AM Pager: [phone]

## 2013-05-20 NOTE — Progress Notes (Signed)
Patient ID: Melanie Wood, female   DOB: 1930/06/16, 79 y.o.   MRN: [id number]    Subjective: Pt feels ok.  No pain.  Unable to talk  Objective: Vital signs in last 24 hours: Temp:  [98.2 F (36.8 C)-99.2 F (37.3 C)] 98.5 F (36.9 C) (09/22 0543) Pulse Rate:  [74-108] 100 (09/22 0543) Resp:  [11-20] 20 (09/22 0543) BP: (92-148)/(49-89) 125/77 mmHg (09/22 0543) SpO2:  [92 %-100 %] 98 % (09/22 0759) Last BM Date: 05/15/13  Intake/Output from previous day: 09/21 0701 - 09/22 0700 In: 1155 [P.O.:480; I.V.:675] Out: 410 [Urine:385; Drains:25] Intake/Output this shift:    PE: Ext: wound VAC in place on left posterior calf.  VAC cannister over half full with bloody output.  Donor site with xeroform in place with minimal amount of ooze.  Lab Results:   Recent Labs  05/20/13 0702  WBC 7.3  HGB 10.9*  HCT 34.6*  PLT 185   BMET  Recent Labs  05/20/13 0702  NA 139  K 3.4*  CL 104  CO2 27  GLUCOSE 109*  BUN 8  CREATININE 0.37*  CALCIUM 8.0*   PT/INR No results found for this basename: LABPROT, INR,  in the last 72 hours CMP     Component Value Date/Time   NA 139 05/20/2013 0702   K 3.4* 05/20/2013 0702   CL 104 05/20/2013 0702   CO2 27 05/20/2013 0702   GLUCOSE 109* 05/20/2013 0702   BUN 8 05/20/2013 0702   CREATININE 0.37* 05/20/2013 0702   CALCIUM 8.0* 05/20/2013 0702   PROT 5.6* 05/20/2013 0702   ALBUMIN 2.6* 05/20/2013 0702   AST 20 05/20/2013 0702   ALT 12 05/20/2013 0702   ALKPHOS 53 05/20/2013 0702   BILITOT 0.6 05/20/2013 0702   GFRNONAA >90 05/20/2013 0702   GFRAA >90 05/20/2013 0702   Lipase  No results found for this basename: lipase       Studies/Results: Mr Brain Wo Contrast  05/18/2013   CLINICAL DATA:  Stroke.  TPA given  EXAM: MRI HEAD WITHOUT CONTRAST  MRA HEAD WITHOUT CONTRAST  TECHNIQUE: Multiplanar, multiecho pulse sequences of the brain and surrounding structures were obtained without intravenous contrast. Angiographic images of the head were  obtained using MRA technique without contrast.  COMPARISON:  None.  FINDINGS: MRI HEAD FINDINGS  Multiple areas of acute infarct bilaterally suggestive of cerebral emboli. The largest area of acute infarct is in the left posterior frontal cortex. Small areas of acute infarct in the frontal lobes bilaterally and in the left occipital lobe. Small acute infarcts in the right thalamus and right occipital lobe. No brainstem infarction.  Moderate atrophy with ventricular enlargement most likely related to the atrophy. Chronic microvascular ischemic change of a moderate degree throughout the white matter bilaterally.  Negative for hemorrhage or mass lesion.  MRA HEAD FINDINGS  Both vertebral arteries are patent to the basilar. PICA patent bilaterally. Basilar widely patent. Superior cerebellar and posterior cerebral arteries widely patent. Fetal origin left posterior cerebral artery.  Right internal carotid artery widely patent. Right anterior and middle cerebral arteries widely patent.  Left internal carotid artery widely patent. Left anterior and middle cerebral artery is widely patent without significant stenosis.  Negative for cerebral aneurysm.  IMPRESSION: MRI HEAD IMPRESSION  Multiple areas of acute infarct in the cerebral hemispheres bilaterally consistent with cerebral emboli. Largest infarct in the left posterior frontal cortex. Generalized atrophy and chronic microvascular ischemic changes in the white matter.  MRA HEAD IMPRESSION  Negative intracranial MRA   Electronically Signed   By: Marlan Palau M.D.   On: 05/18/2013 16:06   Mr Maxine Glenn Head/brain Wo Cm  05/18/2013   CLINICAL DATA:  Stroke.  TPA given  EXAM: MRI HEAD WITHOUT CONTRAST  MRA HEAD WITHOUT CONTRAST  TECHNIQUE: Multiplanar, multiecho pulse sequences of the brain and surrounding structures were obtained without intravenous contrast. Angiographic images of the head were obtained using MRA technique without contrast.  COMPARISON:  None.  FINDINGS:  MRI HEAD FINDINGS  Multiple areas of acute infarct bilaterally suggestive of cerebral emboli. The largest area of acute infarct is in the left posterior frontal cortex. Small areas of acute infarct in the frontal lobes bilaterally and in the left occipital lobe. Small acute infarcts in the right thalamus and right occipital lobe. No brainstem infarction.  Moderate atrophy with ventricular enlargement most likely related to the atrophy. Chronic microvascular ischemic change of a moderate degree throughout the white matter bilaterally.  Negative for hemorrhage or mass lesion.  MRA HEAD FINDINGS  Both vertebral arteries are patent to the basilar. PICA patent bilaterally. Basilar widely patent. Superior cerebellar and posterior cerebral arteries widely patent. Fetal origin left posterior cerebral artery.  Right internal carotid artery widely patent. Right anterior and middle cerebral arteries widely patent.  Left internal carotid artery widely patent. Left anterior and middle cerebral artery is widely patent without significant stenosis.  Negative for cerebral aneurysm.  IMPRESSION: MRI HEAD IMPRESSION  Multiple areas of acute infarct in the cerebral hemispheres bilaterally consistent with cerebral emboli. Largest infarct in the left posterior frontal cortex. Generalized atrophy and chronic microvascular ischemic changes in the white matter.  MRA HEAD IMPRESSION  Negative intracranial MRA   Electronically Signed   By: Marlan Palau M.D.   On: 05/18/2013 16:06    Anti-infectives: Anti-infectives   None       Assessment/Plan  1. S/p wide excision of melanoma from left posterior calf with STSG and SNLNbx 2. CVA 3. Bleeding secondary to TPA   Plan: 1. Cont current care.  Will change VAC tomorrow.  There is bloody output in the [facility] but no documentation on how long that cannister has been in place or how much output is being recorded per shift.  I think the cannister has been present since Saturday, but that  is unclear.  The nursing staff does not know either.  Apparently this isn't something they were given in report from the unit 2. Strict I&Os has been written for so we can be aware of how much is coming out each shift.   LOS: 3 days    Maxxon Schwanke E 05/20/2013, 8:22 AM Pager: [phone]

## 2013-05-20 NOTE — Progress Notes (Signed)
Emptied 30 ml from JP drain.

## 2013-05-21 LAB — GLUCOSE, CAPILLARY
Glucose-Capillary: 101 mg/dL — ABNORMAL HIGH (ref 70–99)
Glucose-Capillary: 143 mg/dL — ABNORMAL HIGH (ref 70–99)
Glucose-Capillary: 132 mg/dL — ABNORMAL HIGH (ref 70–99)

## 2013-05-21 NOTE — Progress Notes (Signed)
Patient's home lisinopril (1.25mg ) is being stored in main pharmacy. If this medication is not restarted, daughter wants to take home. Can continued to be stored in main pharmacy if patient is transferred to CIR with anticipation of dose resuming.  Thanks!

## 2013-05-21 NOTE — Progress Notes (Signed)
Speech Language Pathology Treatment Patient Details Name: Melanie Wood MRN: [id number] DOB: February 18, 1930 Today's Date: 05/21/2013 Time: 1205-1230 SLP Time Calculation (min): 25 min  Assessment / Plan / Recommendation Clinical Impression  Pt. currently exhibits excellent receptive language skills, following all functional commands immediately and without difficulty.  Pt. laughs appropriately to jokes, without delay, and answers yes no questions with 100% accuracy.  Verbally, pt. was able to say her first name and her Nickname "Dot" when asked.  She was able to complete automated sequences (days of week, counting, etc.) and could sing well known songs with min. cues.  Pt. was able to write her name, but struggles to write functional words.  Pt. 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  A communication board was provided with instructions for use to the family.    SLP Plan  Continue with current plan of care    Pertinent Vitals/Pain n/a  SLP Goals  SLP Goals SLP Goal #1 - Progress: Met SLP Goal #2: Label 10/10 objects used in ADL's with moderate phonemic and semantic cues SLP Goal #2 - Progress: Progressing toward goal SLP Goal #3: Recite automatic sequences with moderate verbal and visual cues to initiate inital word SLP Goal #3 - Progress: Met SLP Goal #4: Repeat SLP's production of high probability phrases x5 SLP Goal #4 - Progress: Progressing toward goal SLP Goal #5: Complete target basic problem solving tasks with max assistance  General Temperature Spikes Noted: No Respiratory Status: Room air Behavior/Cognition: Alert;Cooperative;Distractible;Requires cueing Oral Cavity - Dentition: Adequate natural dentition Patient Positioning: Upright in bed  Oral Cavity - Oral Hygiene Does patient have any of the following "at risk" factors?: Other - dysphagia Brush patient's teeth BID with toothbrush (using toothpaste with fluoride):  Yes Patient is HIGH RISK - Oral Care Protocol followed (see row info): Yes Patient is AT RISK - Oral Care Protocol followed (see row info): Yes   Treatment Treatment focused on: Apraxia;Patient/family/caregiver education Family/Caregiver Educated: , husband Skilled Treatment: Treatment focused on oral apraxia, automated speech sequences, and melodic intonnation/singing for increased production of verbal output.   GO     Maryjo Rochester T 05/21/2013, 1:45 PM

## 2013-05-21 NOTE — Progress Notes (Signed)
Stroke Team Progress Note  HISTORY Melanie Wood is an 79 y.o. female with hx of A  Fib who recently was taken off her Xarelto for 4 days in order to have a melanoma removed. Today at 1115 she was noted to have right facial droop, slurred speech and right arm drift. [name] was brought to [facility] where code stroke was called. Patient was given tPA oat 1430 on 9/19 and transferred to [facility] hospital. On arrival patient was brought to 3100. Exam showed improved strength but patient remained dysarthric with expressive difficulties. She does have a wound vac on her left leg with a moderate amount of blood--general surgery has been consulted to see patient and make recommendations.   She was admitted to the neuro ICU  for further evaluation and treatment.  The patient's hospitalist apparently called the patient's neurologists, Dr. Sherryll Burger, and discussed tPA and it was decided to give tPA per family.   SUBJECTIVE Patient lying in bed, getting dressing change. Off of wound vac.  OBJECTIVE Most recent Vital Signs: Filed Vitals:   05/20/13 0200 05/20/13 0543 05/20/13 0759 05/20/13 0949  BP: 130/89 125/77  106/65  Pulse: 78 100  77  Temp: 98.2 F (36.8 C) 98.5 F (36.9 C)  97.7 F (36.5 C)  TempSrc: Oral Oral  Oral  Resp: 18 20  18   Height:      Weight:      SpO2: 96% 96% 98% 98%   CBG (last 3)   Recent Labs  05/19/13 1744 05/19/13 2141 05/20/13 0639  GLUCAP 100* 149* 103*    IV Fluid Intake:   . sodium chloride 75 mL/hr at 05/19/13 0232   Current Facility-Administered Medications  Medication Dose Route Frequency Provider Last Rate Last Dose  . 0.9 %  sodium chloride infusion   Intravenous Continuous Ulice Dash, PA-C 75 mL/hr at 05/20/13 1827    . acetaminophen (TYLENOL) tablet 650 mg  650 mg Oral Q4H PRN Ulice Dash, PA-C   650 mg at 05/20/13 2026   Or  . acetaminophen (TYLENOL) suppository 650 mg  650 mg Rectal Q4H PRN Ulice Dash, PA-C      . albuterol (PROVENTIL)  (5 MG/ML) 0.5% nebulizer solution 2.5 mg  2.5 mg Nebulization Q8H PRN Omelia Blackwater, DO   2.5 mg at 05/18/13 1400   And  . ipratropium (ATROVENT) nebulizer solution 0.5 mg  0.5 mg Nebulization Q8H PRN Omelia Blackwater, DO   0.5 mg at 05/18/13 1400  . antiseptic oral rinse (BIOTENE) solution 15 mL  15 mL Mouth Rinse BID Ritta Slot, MD   15 mL at 05/20/13 2039  . atorvastatin (LIPITOR) tablet 40 mg  40 mg Oral q1800 Omelia Blackwater, DO   40 mg at 05/20/13 1748  . HYDROmorphone (DILAUDID) injection 1-2 mg  1-2 mg Intravenous Q4H PRN Noel Christmas   1 mg at 05/21/13 0237  . insulin aspart (novoLOG) injection 0-9 Units  0-9 Units Subcutaneous TID WC Ritta Slot, MD      . labetalol (NORMODYNE,TRANDATE) injection 10 mg  10 mg Intravenous Q10 min PRN Ulice Dash, PA-C      . mometasone-formoterol [facility]) 100-5 MCG/ACT inhaler 2 puff  2 puff Inhalation BID Omelia Blackwater, DO   2 puff at 05/21/13 0912  . pantoprazole sodium (PROTONIX) 40 mg/20 mL oral suspension 40 mg  40 mg Oral QHS Micki Riley, MD   40 mg at 05/20/13 2134  . Rivaroxaban (XARELTO) tablet 15  mg  15 mg Oral Q supper Cathlyn Parsons, PA-C   15 mg at 05/20/13 1748  . senna-docusate (Senokot-S) tablet 1 tablet  1 tablet Oral QHS PRN Ulice Dash, PA-C      . tiotropium [facility]) inhalation capsule 18 mcg  18 mcg Inhalation Daily Omelia Blackwater, DO   18 mcg at 05/21/13 1610    Diet:  Dysphagia  Activity:  ambulate DVT Prophylaxis: compression devices  CLINICALLY SIGNIFICANT STUDIES Basic Metabolic Panel:   Recent Labs Lab 05/20/13 0702  NA 139  K 3.4*  CL 104  CO2 27  GLUCOSE 109*  BUN 8  CREATININE 0.37*  CALCIUM 8.0*   Liver Function Tests:   Recent Labs Lab 05/20/13 0702  AST 20  ALT 12  ALKPHOS 53  BILITOT 0.6  PROT 5.6*  ALBUMIN 2.6*   CBC:   Recent Labs Lab 05/20/13 0702  WBC 7.3  NEUTROABS 5.1  HGB 10.9*  HCT 34.6*  MCV 93.3  PLT 185   Coagulation: No  results found for this basename: LABPROT, INR,  in the last 168 hours Cardiac Enzymes: No results found for this basename: CKTOTAL, CKMB, CKMBINDEX, TROPONINI,  in the last 168 hours Urinalysis: No results found for this basename: COLORURINE, APPERANCEUR, LABSPEC, PHURINE, GLUCOSEU, HGBUR, BILIRUBINUR, KETONESUR, PROTEINUR, UROBILINOGEN, NITRITE, LEUKOCYTESUR,  in the last 168 hours Lipid Panel    Component Value Date/Time   CHOL 216* 05/18/2013 0430   TRIG 155* 05/18/2013 0430   HDL 71 05/18/2013 0430   CHOLHDL 3.0 05/18/2013 0430   VLDL 31 05/18/2013 0430   LDLCALC 114* 05/18/2013 0430   HgbA1C  Lab Results  Component Value Date   HGBA1C 6.2* 05/18/2013    Urine Drug Screen:   No results found for this basename: labopia,  cocainscrnur,  labbenz,  amphetmu,  thcu,  labbarb    Alcohol Level: No results found for this basename: ETH,  in the last 168 hours    CT of the brain    MRI of the brain   9/20 MRI HEAD IMPRESSION  Multiple areas of acute infarct in the cerebral hemispheres  bilaterally consistent with cerebral emboli. Largest infarct in the  left posterior frontal cortex. Generalized atrophy and chronic  microvascular ischemic changes in the white matter.   MRA of the brain   9/20 MRA HEAD IMPRESSION  Negative intracranial MRA  2D Echocardiogram EF 55%, wall  Motion normal, left atrium mildly dilated. No ASD or PFO identified.   Carotid Doppler: Preliminary report: Bilateral: 1-39% ICA stenosis. Vertebral artery flow is antegrade.   CXR    Therapy Recommendations CIR  Physical Exam   Gen:NAD HEENT- Normocephalic, no lesions, without obvious abnormality. Normal external eye and conjunctiva. Normal TM's bilaterally. Normal auditory canals and external ears. Normal external nose, mucus membranes and septum. Normal pharynx.  Neck supple with no masses, nodes, nodules or enlargement.  Cardiovascular - regular rate and rhythm, S1, S2 normal, no murmur, click, rub or gallop   Lungs - chest clear, no wheezing, rales, normal symmetric air entry, Heart exam - S1, S2 normal, no murmur, no gallop, rate regular  Abdomen - soft, non-tender; bowel sounds normal; no masses, no organomegaly  Extremities - no edema  Neurologic Examination:  Mental Status:  Alert, markedly dysarthric with expressive aphasia. Able to follow 3 step commands without difficulty.  Cranial Nerves:  II: Discs flat bilaterally; Visual fields grossly normal, pupils equal, round, reactive to light and accommodation  III,IV, VI: ptosis  not present, extra-ocular motions intact bilaterally  V,VII: right facial droop, facial light touch sensation normal bilaterally  VIII: hearing normal bilaterally  IX,X: gag reflex present  XI: bilateral shoulder shrug  XII: midline tongue extension  Motor:  Right : Upper extremity 5/5 Left: Upper extremity 5/5  Lower extremity 5/5 Lower extremity 5/5  Tone and bulk:normal tone throughout; no atrophy noted  Sensory: Pinprick and light touch intact throughout, bilaterally  Deep Tendon Reflexes:  Depressed throughout  Plantars:  Mute bilaterally  Cerebellar:  normal finger-to-nose, normal heel-to-shin test  Gait: not tested  ASSESSMENT [name] is a 79 y.o. female presenting with right facial droop, slurred speech and right arm drift. Status post IV t-PA.. Imaging pending. Infarct felt to be  embolic secondary to holding Xarelto for surgical procedure.  On Xarelto prior to admission. Restarted Xarelto 9/20 after MRI shows no hemorrhage. Patient with resultant dysarthria and marked expressive aphasia.    Ischemic infarct likely 2/2 embolic event from A fib/discontinuation of Xarelto  S/p melanoma excision with wound vac in place  Hyperlipidemia, LDL 114, statin started  A fib  HbA1c of 6.2   Hospital day # 3  TREATMENT/PLAN  Continue xarelto.  Risk factor modification  To CIR today if bed available.  Patient to follow up with Dr.  Pearlean Brownie in 2 months.  Gwendolyn Lima. Manson Passey, [facility], MBA, MHA [facility] Pager: [phone] 05/20/2013 10:25 PM  I have personally obtained a history, examined the patient, evaluated imaging results, and formulated the assessment and plan of care. I agree with the above.  Delia Heady, MD

## 2013-05-21 NOTE — Progress Notes (Signed)
Physical Therapy Treatment Patient Details Name: Melanie Wood MRN: [id number] DOB: [birth year] Today's Date: 05/21/2013 Time: 4098-1191 PT Time Calculation (min): 23 min  PT Assessment / Plan / Recommendation  History of Present Illness  79 y.o. female with hx of A Fib who recently was taken off her Xarelto for 4 days in order to have a melanoma removed. Today at 1115 she was noted to have right facial droop, slurred speech and right arm drift. [name] was brought to [place] [facility] where code stroke was called. Patient was given tPA at 14:30 on 9/19 and transferred to [facility] hospital. On arrival patient was brought to 3100. Exam showed improved strength but patient remained dysarthric with expressive difficulties. She does have a wound vac on her left leg with a moderate amount of blood--general surgery has been consulted to see patient and make recommendations. Multiple areas of acute infarct in the cerebral hemispheres bilaterally consistent with cerebral emboli. Largest infarct in the left posterior frontal cortex.   PT Comments   Pt making progress with mobility.  Increased ambulation distance but c/o back pain entire distance.  Very motivated.    Follow Up Recommendations  CIR     Does the patient have the potential to tolerate intense rehabilitation     Barriers to Discharge        Equipment Recommendations       Recommendations for Other Services Rehab consult  Frequency Min 4X/week   Progress towards PT Goals Progress towards PT goals: Progressing toward goals  Plan Frequency needs to be updated;Current plan remains appropriate    Precautions / Restrictions Precautions Precautions: Fall;Other (comment) Restrictions Weight Bearing Restrictions: No   Pertinent Vitals/Pain C/o back pain but did not rate.      Mobility  Bed Mobility Bed Mobility: Not assessed Transfers Transfers: Sit to Stand;Stand to Sit Sit to Stand: 4: Min assist;With upper extremity assist;With  armrests;From chair/3-in-1;From toilet Stand to Sit: 4: Min guard;With upper extremity assist;With armrests;To chair/3-in-1;To toilet Details for Transfer Assistance: cues for hand placement & safe body positioning before sitting Ambulation/Gait Ambulation/Gait Assistance: 4: Min guard Ambulation Distance (Feet): 100 Feet Assistive device: Rolling walker Ambulation/Gait Assistance Details: cues for posture & to look ahead for increased awareness of environment.  Pt c/o back pain throughout duration of ambulation.  Pt with slight limp.   Gait Pattern: Step-through pattern;Decreased stride length Stairs: No Wheelchair Mobility Wheelchair Mobility: No Modified Rankin (Stroke Patients Only) Pre-Morbid Rankin Score: No significant disability Modified Rankin: Moderately severe disability      PT Goals (current goals can now be found in the care plan section) Acute Rehab PT Goals PT Goal Formulation: With patient/family Time For Goal Achievement: 05/26/13 Potential to Achieve Goals: Good  Visit Information  Last PT Received On: 05/21/13 Assistance Needed: +1 History of Present Illness:  79 y.o. female with hx of A Fib who recently was taken off her Xarelto for 4 days in order to have a melanoma removed. Today at 1115 she was noted to have right facial droop, slurred speech and right arm drift. [name] was brought to [place] [facility] where code stroke was called. Patient was given tPA at 14:30 on 9/19 and transferred to [facility] hospital. On arrival patient was brought to 3100. Exam showed improved strength but patient remained dysarthric with expressive difficulties. She does have a wound vac on her left leg with a moderate amount of blood--general surgery has been consulted to see patient and make recommendations. Multiple areas of acute infarct in the  cerebral hemispheres bilaterally consistent with cerebral emboli. Largest infarct in the left posterior frontal cortex.    Subjective Data       Cognition  Cognition Arousal/Alertness: Awake/alert Behavior During Therapy: Flat affect Overall Cognitive Status: Impaired/Different from baseline Area of Impairment: Safety/judgement Safety/Judgement: Decreased awareness of safety;Decreased awareness of deficits    Balance     End of Session PT - End of Session Equipment Utilized During Treatment: Gait belt Activity Tolerance: Patient tolerated treatment well Patient left: in chair;with call bell/phone within reach;with family/visitor present Nurse Communication: Mobility status     Verdell Face, [place] [phone] 05/21/2013

## 2013-05-21 NOTE — PMR Pre-admission (Signed)
PMR Admission Coordinator Pre-Admission Assessment  Patient: Melanie Wood is an 79 y.o., female MRN: [id number] DOB: [birth year] Height: 5\' 2"  (157.5 cm) Weight: 82.2 kg (181 lb 3.5 oz)              Insurance Information HMO: yes    PPO:      PCP:      IPA:      80/20:      OTHER: Medicare replacement policy PRIMARY: Blue Medicare      Policy#: [id number]      Subscriber: pt CM Name: Molly Maduro     Phone#: [phone]     Fax#: [phone] Pre-Cert#: [id number]    Employer: retired  Update CM in 7 days 10/2 Benefits:  Phone #: [phone]     Name: 05/21/13 Eff. Date: 08/29/12     Deduct: none      Out of Pocket Max: $3400      Life Max: none CIR: $170 per day days 1 through 7      SNF: no copay days 1 through 10, $50 per day days 11 through 100 Outpatient: $35 per visit     Co-Pay: no visit limit Home Health: 100%      Co-Pay: no visit limit DME: 80%     Co-Pay: 20% Providers: in network  SECONDARY: none       Emergency Contact Information Contact Information   Name Relation Home Work Mobile   [place] Daughter [phone]     Lynley, Killilea Spouse [phone]       Current Medical History  Patient Admitting Diagnosis: embolic bicerebral infarcts  History of Present Illness: Melanie Wood is a 79 y.o. RH-female with history of A fib, HTN, who was taken off Xarelto to have melanoma removed.   On 05/17/13, she was admitted with slurred speech with expressive difficulties, right facial droop and RUE weakness. MRI/MRA brain showed acute bilateral cerebral infarcts in bilateral frontal lobes (-largest in left posterior), Bilateral occipital and rigth thalamus; no stenosis. 2D echo with EF 55-60% and no wall abnormality. Neurology consulted and recommended resuming Xarelto for embolic stroke due to A Fib.   CCS consulted for input on VAC and dressing changed 05/21/13. Patient with hx of malignant melanoma removed by surgical group at [facility] 9/18 with STSG. STSG left posterior calf. Donor  site left lateral thigh. Wound VAc removed 05/21/13.  Patient continues with expressive deficits as well as reading deficits with suspicion of visual deficits, right sided weakness as well as problems with mobility.  NIH Total: 5    Past Medical History  Past Medical History  Diagnosis Date  . Dementia   . A-fib   . Hypertension     Family History  family history is not on file.  Prior Rehab/Hospitalizations: none   Current Medications  Current facility-administered medications:0.9 %  sodium chloride infusion, , Intravenous, Continuous, Ulice Dash, PA-C, Last Rate: 75 mL/hr at 05/20/13 1827;  acetaminophen (TYLENOL) suppository 650 mg, 650 mg, Rectal, Q4H PRN, Ulice Dash, PA-C;  acetaminophen (TYLENOL) tablet 650 mg, 650 mg, Oral, Q4H PRN, Ulice Dash, PA-C, 650 mg at 05/23/13 1046 albuterol (PROVENTIL) (5 MG/ML) 0.5% nebulizer solution 2.5 mg, 2.5 mg, Nebulization, Q8H PRN, Omelia Blackwater, DO, 2.5 mg at 05/18/13 1400;  antiseptic oral rinse (BIOTENE) solution 15 mL, 15 mL, Mouth Rinse, BID, Ritta Slot, MD, 15 mL at 05/22/13 2218;  atorvastatin (LIPITOR) tablet 40 mg, 40 mg, Oral, q1800, Omelia Blackwater, DO, 40 mg at 05/22/13 1713 HYDROmorphone (  DILAUDID) injection 1-2 mg, 1-2 mg, Intravenous, Q4H PRN, Noel Christmas, 1 mg at 05/22/13 2216;  insulin aspart (novoLOG) injection 0-9 Units, 0-9 Units, Subcutaneous, TID WC, Ritta Slot, MD;  ipratropium (ATROVENT) nebulizer solution 0.5 mg, 0.5 mg, Nebulization, Q8H PRN, Omelia Blackwater, DO, 0.5 mg at 05/18/13 1400;  labetalol (NORMODYNE,TRANDATE) injection 10 mg, 10 mg, Intravenous, Q10 min PRN, Ulice Dash, PA-C mometasone-formoterol [facility]) 100-5 MCG/ACT inhaler 2 puff, 2 puff, Inhalation, BID, Omelia Blackwater, DO, 2 puff at 05/22/13 2312;  pantoprazole sodium (PROTONIX) 40 mg/20 mL oral suspension 40 mg, 40 mg, Oral, QHS, Micki Riley, MD, 40 mg at 05/22/13 2259;  Rivaroxaban (XARELTO) tablet 15  mg, 15 mg, Oral, Q supper, Cathlyn Parsons, PA-C, 15 mg at 05/22/13 1713 senna-docusate (Senokot-S) tablet 1 tablet, 1 tablet, Oral, QHS PRN, Ulice Dash, PA-C;  tiotropium [facility]) inhalation capsule 18 mcg, 18 mcg, Inhalation, Daily, Omelia Blackwater, DO, 18 mcg at 05/22/13 0840  Patients Current Diet: Dysphagia 3 with thin liquids  Precautions / Restrictions Precautions Precautions: Fall;Other (comment) Precaution Comments: JP drain left leg.  She no longer has the wound vag Restrictions Weight Bearing Restrictions: No   Prior Activity Level Lived at ALF since 5/14. [employer], Toni Amend, is the contact at [phone]. They can provide additional assistance at the ALF with increased charges. Pt typically ambulated Mod I with RW to dining room. Has Bedroom, bath, and sitting area private room. Her spouse is in Memory Care unit of same facility.  Home Assistive Devices / Equipment Home Assistive Devices/Equipment: Eyeglasses;Walker (specify type) Home Equipment: Walker - 2 wheels  Prior Functional Level Prior Function Level of Independence: Independent with assistive device(s) Comments: Ambulates to dining hall at ALF with RW and no assistance  Current Functional Level Cognition  Arousal/Alertness: Awake/alert Overall Cognitive Status: Within Functional Limits for tasks assessed Current Attention Level: Selective Orientation Level: Oriented X4 Safety/Judgement: Decreased awareness of safety;Decreased awareness of deficits General Comments: pt much more appropriate and good short term recall. Pt smiling during session and easily frustrated by language deficits. Pt remains with delay to some questions which could be partly due to pt attempting to say answer instead of write the answer.  Attention: Sustained Sustained Attention: Impaired Sustained Attention Impairment: Verbal complex;Verbal basic;Functional basic Memory: Appears intact Awareness: Appears intact Problem  Solving: Impaired Problem Solving Impairment: Functional basic Behaviors: Restless    Extremity Assessment (includes Sensation/Coordination)          ADLs  Eating/Feeding: Set up Where Assessed - Eating/Feeding: Chair Lower Body Dressing: Maximal assistance (don panties over wound vac and jp drain) Where Assessed - Lower Body Dressing: Supported sit to Pharmacist, hospital: Moderate assistance Toilet Transfer Method: Sit to stand Toilet Transfer Equipment: Raised toilet seat with arms (or 3-in-1 over toilet) Toileting - Clothing Manipulation and Hygiene: Maximal assistance Where Assessed - Toileting Clothing Manipulation and Hygiene: Sit to stand from 3-in-1 or toilet Equipment Used: Rolling walker;Gait belt Transfers/Ambulation Related to ADLs: Pt ambulated with (A) to progress RW. pt needed cues to keep Rw from running into walker on the right side.  ADL Comments: Pt s/p ambulation with daughter PTA.Marland Kitchen pt returning to bed and fatigued. Session focused on recall and ability to write. Pt writing complete thoughts this session "I don't know how I feeling" This is much improved from evaluation and pt verbalized multiple times during session appropriately. Pt repeating back to therapist words therapist states to reattempt a successful verbalization. Pt needed cues to  use dry erase board after patient became frustrated due to inability to express needs. Pt demonstrates cognitive deficits due to inability to problem solve expressing needs with dry erase board or picture board left by SLP. Pt recalling events of the morning with daughters present to confirm. pt declined oOb or bed mobility at this time. PT and family discussing d/c plans and confirmed awaiting CIR approval tomorrow. Pt eager to progress to CIR tomorrow.    Mobility  Bed Mobility: Supine to Sit;Sitting - Scoot to Edge of Bed Supine to Sit: 4: Min assist;With rails;HOB elevated Sitting - Scoot to [place] of Bed: 4: Min assist;With  rail    Transfers  Transfers: Sit to Stand;Stand to Sit Sit to Stand: 4: Min assist;With upper extremity assist;With armrests;From bed;From toilet Sit to Stand: Patient Percentage: 60% Stand to Sit: 4: Min assist;With upper extremity assist;With armrests;To chair/3-in-1;To toilet Stand to Sit: Patient Percentage: 60% Stand Pivot Transfers: 1: +2 Total assist Stand Pivot Transfers: Patient Percentage: 70%    Ambulation / Gait / Stairs / Wheelchair Mobility  Ambulation/Gait Ambulation/Gait Assistance: 4: Min Environmental consultant (Feet): 150 Feet Assistive device: Rolling walker Ambulation/Gait Assistance Details: verbal cues for safe RW use, upright posture and midline position in the RW (pt drifts to the left).   Gait Pattern: Step-through pattern;Shuffle;Trunk flexed Stairs: No Wheelchair Mobility Wheelchair Mobility: No    Posture / Games developer Sitting - Balance Support: Bilateral upper extremity supported;Feet supported Static Sitting - Level of Assistance: 5: Stand by assistance Static Sitting - Comment/# of Minutes: Initial min (A) due to posterior lean and able to progress to stand by assistance with cues for proper midline and upright posture Static Standing Balance Static Standing - Balance Support: Bilateral upper extremity supported Static Standing - Level of Assistance: 5: Stand by assistance Static Standing - Comment/# of Minutes: Pt with posterior lean in standing; pt needed tactile cues to promote midline and upright posture.  Dynamic Standing Balance Dynamic Standing - Balance Support: Left upper extremity supported;Right upper extremity supported;During functional activity Dynamic Standing - Level of Assistance: 4: Min assist Dynamic Standing - Comments: one upper extremity supported while preforming her own peri care, and pulling up her underwear and pands in standing.      Special needs/care consideration Skin STSG left posterior  calf with donor site left lateral thigh. Wound bed 90% take. Noted 3 areas of greenish blue tissue and same discoloration noted along the edge of the wound bed from 1- 2 o'clock. Surgeon requested moist wound dressing placed, and protection of the wound. Plans to leave xeroform in place only changing topper gauze daily, kerlix and ACE. Left groin site of sentinel node bx. With JP, sutures intact. Recommended by WOC on 9/23:Cut to fit and place Mepitel [phone]) over skin graft left lateral calf, coat wound bed over Mepitel with hydrogel (lawson [phone]), cover with dry dressing, wrap with conform, secure with ACE (not tight, no compression). Change daily, ok to leave Mepitel in place and only remove gauze dressings, reapply hydrogel and topper daily. Dry dressing to the right groin. Leave xeroform in place (do not change), only change topper gauze at donor site.   Bowel mgmt:continent Bladder mgmt:continent   Previous Home Environment Living Arrangements: Other (Comment) (at ALF since 12/2012)  Lives With: Alone;Other (Comment) (spouse lives in the Memory care unit) Available Help at Discharge: Available 24 hours/day;Family Type of Home: Assisted living Care Facility [facility]: [facility] Layout: One level Home Care  Services: No  Discharge Living Setting Does the patient have any problems obtaining your medications?: No  Social/Family/Support Systems Patient Roles: Spouse;Parent Contact Information: Enis Slipper, daughter Anticipated Caregiver: ALF Anticipated Caregiver's Contact Information: cell [phone]; Toni Amend at [facility] [phone] Discharge Plan Discussed with Primary Caregiver: Yes Is Caregiver In Agreement with Plan?: Yes  Rosey Bath is a nurse at another Memory Care unit in [place]. Two other daughters live in [place]. Her parents moved here about one year ago.  Goals/Additional Needs Patient/Family Goal for Rehab: supervision to min assist with PT,  OT, and SLP Expected length of stay: ELOS 3 weeks Pt/Family Agrees to Admission and willing to participate: Yes Program Orientation Provided & Reviewed with Pt/Caregiver Including Roles  & Responsibilities: Yes Information Needs to be Provided By: I discussed with pt and daughter that [employer] Medicare hesitant about paying for inpt rehab and then SNF after d/c   Decrease burden of Care through IP rehab admission: n/a  Possible need for SNF placement upon discharge: not anticipated. Courtney at [facility] capable of providing all levels of care needed at the ALF.   Patient Condition: This patient's medical and functional status has changed since the consult dated: 05/20/13 in which the Rehabilitation Physician determined and documented that the patient's condition is appropriate for intensive rehabilitative care in an inpatient rehabilitation facility. See "History of Present Illness" (above) for medical update. Functional changes are: overall min assist with w=expressive aphasia.. Patient's medical and functional status update has been discussed with the Rehabilitation physician and patient remains appropriate for inpatient rehabilitation. Will admit to inpatient rehab today.  Preadmission Screen Completed By:  Clois Dupes, 05/23/2013 11:54 AM ______________________________________________________________________   Discussed status with Dr. Wynn Banker on 05/23/13 at 1154 and received telephone approval for admission today.  Admission Coordinator:  Clois Dupes, time 9528 Date 05/23/13.

## 2013-05-21 NOTE — Progress Notes (Signed)
I met with pt and her daughter at bedside. Patient resides at [facility] assisted living facility since May, 2014. Her spouse lives in the Memory unit. I have contacted, Toni Amend, [phone] to assess ability to provide care after a short inpt rehab stay. I await insurance approval to admit pt hopefully tomorrow. [phone]

## 2013-05-21 NOTE — Progress Notes (Signed)
Speech Language Pathology Dysphagia Treatment Patient Details Name: Melanie Wood MRN: [id number] DOB: 1929-09-12 Today's Date: 05/21/2013 Time: 9147-8295 SLP Time Calculation (min): 25 min  Assessment / Plan / Recommendation Clinical Impression  Pt. appears to be ready for diet upgrade.  There was mild oral residue, which pt. was able to clear with verbal cues.  Pt. and family are eager to have diet advanced.     Diet Recommendation  Continue with Current Diet: Thin liquid Initiate / Change Diet: Dysphagia 3 (mechanical soft) (with chopped meats)    SLP Plan Continue with current plan of care   Pertinent Vitals/Pain n/a   Swallowing Goals  SLP Swallowing Goals Patient will consume recommended diet without observed clinical signs of aspiration with: Minimal cueing Swallow Study Goal #1 - Progress: Progressing toward goal Patient will utilize recommended strategies during swallow to increase swallowing safety with: Minimal assistance Swallow Study Goal #2 - Progress: Progressing toward goal  General Temperature Spikes Noted: No Respiratory Status: Room air Behavior/Cognition: Alert;Cooperative;Distractible;Requires cueing Oral Cavity - Dentition: Adequate natural dentition Patient Positioning: Upright in bed  Oral Cavity - Oral Hygiene Does patient have any of the following "at risk" factors?: Other - dysphagia Brush patient's teeth BID with toothbrush (using toothpaste with fluoride): Yes Patient is HIGH RISK - Oral Care Protocol followed (see row info): Yes Patient is AT RISK - Oral Care Protocol followed (see row info): Yes   Dysphagia Treatment Treatment focused on: Skilled observation of diet tolerance;Upgraded PO texture trials;Patient/family/caregiver Dealer Educated: 2 Daughters and Husband Treatment Methods/Modalities: Skilled observation Patient observed directly with PO's: Yes Type of PO's observed: Dysphagia 3 (soft);Thin liquids Feeding:  Able to feed self;Needs set up Liquids provided via: Cup Pharyngeal Phase Signs & Symptoms: Immediate throat clear Type of cueing: Verbal Amount of cueing: Minimal   GO     Maryjo Rochester T 05/21/2013, 1:23 PM

## 2013-05-21 NOTE — Consult Note (Signed)
WOC consult Note Reason for Consult: evaluation of wound at first post-op VAC change. Pt with hx of malignant melanoma removal by surgical group at [facility] last Thursday with STSG. She develop systems of CVA and received tPA at [facility] and was subsequently transferred to [facility]. She has had VAC in place since surgical procedure on Thursday 05/16/13.  CCS NP at the bedside to assess wound.  Wound type: surgical site with placement of STSG.left posterior calf.  Noted donor site left lateral thigh. Measurement: did not obtain measurements at today assessment Wound bed: 90% take, one area lifted slightly with the black sponge (just a little distal of 12 o'clock). Noted 3 areas of greenish blue tissue and same discoloration noted along the edge of the wound bed from 1-2 o'clock.  Emina-CCS NP to contact surgeon that performed dressing to determine any products that were used during surgery that may have caused this discoloration.  Otherwise graft intact with sutures at wound edges   Drainage (amount, consistency, odor) sanguinous drainage in the VAC canister Periwound: intact, noted area under where VAC had been bridged without skin protection to be a bit denuded from the sponge. After discussion with surgeon, may be related to use of methylene blue, however reported that this substance is irrigated off the wound prior to [facility] placement.  Surgeon would like moist wound dressing placed, protection of the wound since pt to transfer to rehab.   Noted that the graft site is doing well, continue to leave xeroform in place only changing topper gauze daily, kerlix and ACE. Left groin site at site of sentinel node bx. With JP, sutures intact.  Dressing procedure/placement/frequency: Cut to fit and place Mepitel ([id number]) over skin graft left lateral calf, coat wound bed over Mepitel with hydrogel (lawson [phone]), cover with dry dressing, wrap with conform, secure with ACE (not tight, no compression). Change daily, ok to  leave Mepitel in place and only remove gauze dressings, reapply hydrogel and topper daily.  Dry dressing to the right groin.  Leave xeroform in place (do not change), only change topper gauze at donor site.    Discussed POC with patient and bedside nurse.   Thanks  Ozie Lupe [employer], CWOCN [phone])

## 2013-05-21 NOTE — Progress Notes (Signed)
Hypoglycemic Event  CBG: 67  Treatment: 15 GM carbohydrate snack  Symptoms: None   Follow-up CBG: [id number] CBG Result: 80  Possible Reasons for Event: Inadequate meal intake  Comments/MD notified: MD notified, no orders given.     PINEDA, Mallie Linnemann E  Remember to initiate Hypoglycemia Order Set & complete

## 2013-05-21 NOTE — Progress Notes (Signed)
  Subjective: Pt feeling good, sitting up in a chair.  Daughter would like order for shower, but will sit in chair by shower to wash hair, which seems reasonable.  She ambulated 3x yesterday with walker.  Tolerating diet, asking for change in dys 1.  I called SLP, will see her next to evaluate. Denies chills or sweats.  Vac and drain are in place.  Per daughter, vac cannister has not been changed since transfer.   Objective: Vital signs in last 24 hours: Temp:  [97.7 F (36.5 C)-99.1 F (37.3 C)] 97.9 F (36.6 C) (09/23 1012) Pulse Rate:  [70-87] 70 (09/23 1012) Resp:  [17-20] 20 (09/23 1012) BP: (113-147)/(60-91) 113/75 mmHg (09/23 1012) SpO2:  [95 %-98 %] 98 % (09/23 1012) Last BM Date: 05/15/13  Intake/Output from previous day: 09/22 0701 - 09/23 0700 In: 240 [P.O.:240] Out: 165 [Drains:165] Intake/Output this shift:    General appearance: alert, cooperative, appears stated age and no distress Resp: clear to auscultation bilaterally Cardio: s1s2 rrr no murmurs gallops or rubs. GI: soft, non-tender; bowel sounds normal; no masses,  no organomegaly Incision/Wound: VAC in place, 50ml recorded since yesterday.  JP drain in place with serosanguinous output.  Dressing is dry and intact. Neuro: push and pulls are strong and equal.    Lab Results:   Recent Labs  05/20/13 0702  WBC 7.3  HGB 10.9*  HCT 34.6*  PLT 185   BMET  Recent Labs  05/20/13 0702  NA 139  K 3.4*  CL 104  CO2 27  GLUCOSE 109*  BUN 8  CREATININE 0.37*  CALCIUM 8.0*    Assessment/Plan: 1. S/p wide excision of melanoma from left posterior calf with STSG and SNLNbx  2. CVA  3. Bleeding secondary to TPA   We will change VAC today along with WOC.  50ml of bloody output recorded since yesterday which is reassuring.  If her wound looks okay, she can be transferred to rehab as planned per primary team.  Continue with I&Os for duration of VAC and drains   LOS: 4 days    Bonner Puna [facility]  ANP-BC Pager [phone]  05/21/2013 10:36 AM  Update 1630-I spoke with surgeon on call Dr. Colette Ribas due to blue/turquise appearance on excision site.  Per OR note, methylene blue injected, this was relayed to the surgeon.  Did not have the appearance of pseudomonas or infection for that matter.  He felt it was indeed from the injection.  The graft looked quite good.  Wound VAC was removed and WOC reapplied dressing.  Dr. Rigoberto Noel recommendation was to keep the wound moist and follow up with surgeon.  Daughters to make follow up appointment.  Wound care per WOC.   Continue with drain care and consult Dr. Mare Ferrari.  We will sign off.  Please do not hesitate to contact [facility] with questions or concerns.  Stable for transder

## 2013-05-22 LAB — GLUCOSE, CAPILLARY
Glucose-Capillary: 115 mg/dL — ABNORMAL HIGH (ref 70–99)
Glucose-Capillary: 104 mg/dL — ABNORMAL HIGH (ref 70–99)

## 2013-05-22 LAB — CBC
MCH: 29.4 pg (ref 26.0–34.0)
MCHC: 31.5 g/dL (ref 30.0–36.0)
Platelets: 220 10*3/uL (ref 150–400)
WBC: 6.6 10*3/uL (ref 4.0–10.5)

## 2013-05-22 NOTE — Progress Notes (Signed)
Await insurance decision to admit pt to inpt rehab. [phone]

## 2013-05-22 NOTE — Progress Notes (Signed)
Occupational Therapy Treatment Patient Details Name: Melanie Wood MRN: [id number] DOB: [birth year] Today's Date: 05/22/2013 Time: 4098-1191 OT Time Calculation (min): 11 min  OT Assessment / Plan / Recommendation  History of present illness  79 y.o. female with hx of A Fib who recently was taken off her Xarelto for 4 days in order to have a melanoma removed. Today at 1115 she was noted to have right facial droop, slurred speech and right arm drift. [name] was brought to [place] [facility] where code stroke was called. Patient was given tPA at 14:30 on 9/19 and transferred to [facility] hospital. On arrival patient was brought to 3100. Exam showed improved strength but patient remained dysarthric with expressive difficulties. She does have a wound vac on her left leg with a moderate amount of blood--general surgery has been consulted to see patient and make recommendations. Multiple areas of acute infarct in the cerebral hemispheres bilaterally consistent with cerebral emboli. Largest infarct in the left posterior frontal cortex.   OT comments  Pt demonstrates ambulation with family this PM. Pt in bed due to fatigue. Session focused on cognition and problem solving expressing needs. Pt demonstrates need for continued therapy to due decr activity tolerance from baseline.  Follow Up Recommendations  CIR    Barriers to Discharge       Equipment Recommendations  3 in 1 bedside comode    Recommendations for Other Services Rehab consult  Frequency Min 2X/week   Progress towards OT Goals    Plan Discharge plan remains appropriate    Precautions / Restrictions     Pertinent Vitals/Pain No pain s/p medication from RN prior to OT arrival    ADL  ADL Comments: Pt s/p ambulation with daughter PTA.Marland Kitchen pt returning to bed and fatigued. Session focused on recall and ability to write. Pt writing complete thoughts this session "I don't know how I feeling" This is much improved from evaluation and pt verbalized  multiple times during session appropriately. Pt repeating back to therapist words therapist states to reattempt a successful verbalization. Pt needed cues to use dry erase board after patient became frustrated due to inability to express needs. Pt demonstrates cognitive deficits due to inability to problem solve expressing needs with dry erase board or picture board left by SLP. Pt recalling events of the morning with daughters present to confirm. pt declined oOb or bed mobility at this time. PT and family discussing d/c plans and confirmed awaiting CIR approval tomorrow. Pt eager to progress to CIR tomorrow.    OT Diagnosis:    OT Problem List:   OT Treatment Interventions:     OT Goals(current goals can now be found in the care plan section) Acute Rehab OT Goals Patient Stated Goal: Family goals is for pt to go to inpatient rehab; pt did not set due to limited vocalization OT Goal Formulation: With patient Time For Goal Achievement: 06/03/13 Potential to Achieve Goals: Good ADL Goals Pt Will Perform Grooming: standing;with min assist Pt Will Perform Upper Body Dressing: with supervision;sitting Pt Will Perform Lower Body Dressing: with min guard assist;sit to/from stand Pt Will Transfer to Toilet: with min assist;bedside commode  Visit Information  Last OT Received On: 05/22/13 History of Present Illness:  79 y.o. female with hx of A Fib who recently was taken off her Xarelto for 4 days in order to have a melanoma removed. Today at 1115 she was noted to have right facial droop, slurred speech and right arm drift. [name] was brought to [place]  [facility] where code stroke was called. Patient was given tPA at 14:30 on 9/19 and transferred to [facility] hospital. On arrival patient was brought to 3100. Exam showed improved strength but patient remained dysarthric with expressive difficulties. She does have a wound vac on her left leg with a moderate amount of blood--general surgery has been consulted to  see patient and make recommendations. Multiple areas of acute infarct in the cerebral hemispheres bilaterally consistent with cerebral emboli. Largest infarct in the left posterior frontal cortex.    Subjective Data      Prior Functioning       Cognition  Cognition Arousal/Alertness: Awake/alert Behavior During Therapy: WFL for tasks assessed/performed General Comments: pt much more appropriate and good short term recall. Pt smiling during session and easily frustrated by language deficits. Pt remains with delay to some questions which could be partly due to pt attempting to say answer instead of write the answer.     Mobility  Bed Mobility Bed Mobility: Not assessed Transfers Transfers: Not assessed    Exercises      Balance     End of Session OT - End of Session Activity Tolerance: Patient tolerated treatment well Patient left: in bed;with call bell/phone within reach;with family/visitor present Nurse Communication: Precautions;Mobility status  GO     Harolyn Rutherford 05/22/2013, 3:08 PM Pager: [phone]

## 2013-05-22 NOTE — Progress Notes (Signed)
Physical Therapy Treatment Patient Details Name: Melanie Wood MRN: [id number] DOB: [birth year] Today's Date: 05/22/2013 Time: 9562-1308 PT Time Calculation (min): 22 min  PT Assessment / Plan / Recommendation  History of Present Illness 79 y.o. female with hx of A Fib who recently was taken off her Xarelto for 4 days in order to have a melanoma removed from her left leg.  When she was admitted on 05/17/13 she was noted to have right facial droop, slurred speech and right arm drift. [name] was brought to [place] [facility] where code stroke was called. Patient was given tPA and transferred to [facility] hospital. MRI revealed multiple areas of acute infarct in the cerebral hemispheres bilaterally consistent with cerebral emboli. Largest infarct in the left posterior frontal cortex.   PT Comments   The pt is progressing well with mobility.  She did well when pre medicated for pain.  Her gait distance was further today than last session and she is doing more for herself. I continue to believe that she would benefit from and inpatient rehab stay before returning to ALF so that she may return to her prior level of mobility safely, reduce caregiver burden, and reduce her risk of falling.    Follow Up Recommendations  CIR     Does the patient have the potential to tolerate intense rehabilitation    Yes  Barriers to Discharge   None      Equipment Recommendations    None   Recommendations for Other Services   None  Frequency Min 4X/week   Progress towards PT Goals Progress towards PT goals: Progressing toward goals  Plan Current plan remains appropriate    Precautions / Restrictions Precautions Precautions: Fall;Other (comment) Precaution Comments: JP drain left leg.  She no longer has the wound vag   Pertinent Vitals/Pain See vitals flow sheet.     Mobility  Bed Mobility Bed Mobility: Supine to Sit;Sitting - Scoot to Edge of Bed Supine to Sit: 4: Min assist;With rails;HOB elevated Sitting -  Scoot to [place] of Bed: 4: Min assist;With rail Details for Bed Mobility Assistance: min assist to support trunk while moving to EOB.  Even with HOB elevated pt struggled with bed mobility.   Transfers Transfers: Sit to Stand;Stand to Sit Sit to Stand: 4: Min assist;With upper extremity assist;With armrests;From bed;From toilet Stand to Sit: 4: Min assist;With upper extremity assist;With armrests;To chair/3-in-1;To toilet Details for Transfer Assistance: Min assist to support trunk during transitions.  Verbal cues for safe hand placement and RW use.   Ambulation/Gait Ambulation/Gait Assistance: 4: Min assist Ambulation Distance (Feet): 150 Feet Assistive device: Rolling walker Ambulation/Gait Assistance Details: verbal cues for safe RW use, upright posture and midline position in the RW (pt drifts to the left).   Gait Pattern: Step-through pattern;Shuffle;Trunk flexed Modified Rankin (Stroke Patients Only) Pre-Morbid Rankin Score: No significant disability Modified Rankin: Moderately severe disability    Exercises General Exercises - Upper Extremity Shoulder Flexion: AROM;Both;10 reps;Seated Elbow Flexion: AROM;Both;10 reps;Seated General Exercises - Lower Extremity Ankle Circles/Pumps: AROM;Both;10 reps;Seated Long Arc Quad: AROM;Both;10 reps;Seated Hip ABduction/ADduction: AROM;Both;10 reps;Seated Hip Flexion/Marching: AROM;Both;10 reps;Seated     PT Goals (current goals can now be found in the care plan section) Acute Rehab PT Goals Patient Stated Goal: Family goals is for pt to go to inpatient rehab; pt did not set due to limited vocalization  Visit Information  Last PT Received On: 05/22/13 Assistance Needed: +1 History of Present Illness: 79 y.o. female with hx of A Fib who recently  was taken off her Xarelto for 4 days in order to have a melanoma removed from her left leg.  When she was admitted on 05/17/13 she was noted to have right facial droop, slurred speech and right arm  drift. [name] was brought to [place] [facility] where code stroke was called. Patient was given tPA and transferred to [facility] hospital. MRI revealed multiple areas of acute infarct in the cerebral hemispheres bilaterally consistent with cerebral emboli. Largest infarct in the left posterior frontal cortex.    Subjective Data  Subjective: Pt trying to speak, with difficulty and frustration Patient Stated Goal: Family goals is for pt to go to inpatient rehab; pt did not set due to limited vocalization   Cognition  Cognition Arousal/Alertness: Awake/alert Behavior During Therapy: WFL for tasks assessed/performed Overall Cognitive Status: Within Functional Limits for tasks assessed General Comments: pt much more appropriate and good short term recall. Pt smiling during session and easily frustrated by language deficits. Pt remains with delay to some questions which could be partly due to pt attempting to say answer instead of write the answer.     Balance  Balance Balance Assessed: Yes Static Sitting Balance Static Sitting - Balance Support: Bilateral upper extremity supported;Feet supported Static Sitting - Level of Assistance: 5: Stand by assistance Static Standing Balance Static Standing - Balance Support: Bilateral upper extremity supported Static Standing - Level of Assistance: 5: Stand by assistance Dynamic Standing Balance Dynamic Standing - Balance Support: Left upper extremity supported;Right upper extremity supported;During functional activity Dynamic Standing - Level of Assistance: 4: Min assist Dynamic Standing - Comments: one upper extremity supported while preforming her own peri care, and pulling up her underwear and pands in standing.    End of Session PT - End of Session Activity Tolerance: Patient limited by fatigue;Patient limited by pain Patient left: in chair;with call bell/phone within reach;with family/visitor present     Lurena Joiner B. Emanuelle Bastos, PT, DPT  [phone]   05/22/2013, 4:46 PM

## 2013-05-22 NOTE — Progress Notes (Addendum)
Stroke Team Progress Note  HISTORY Melanie Wood is an 79 y.o. female with hx of A  Fib who recently was taken off her Xarelto for 4 days in order to have a melanoma removed. Today at 1115 she was noted to have right facial droop, slurred speech and right arm drift. [name] was brought to [place] [facility] where code stroke was called. Patient was given tPA oat 1430 on 9/19 and transferred to [facility] hospital. On arrival patient was brought to 3100. Exam showed improved strength but patient remained dysarthric with expressive difficulties. She does have a wound vac on her left leg with a moderate amount of blood--general surgery has been consulted to see patient and make recommendations.   She was admitted to the neuro ICU  for further evaluation and treatment.  The patient's hospitalist apparently called the patient's neurologists, Dr. Sherryll Burger, and discussed tPA and it was decided to give tPA per family.   SUBJECTIVE  Patient progressing well.  Anticipating rehabilitation stay.   OBJECTIVE Most recent Vital Signs: Filed Vitals:   05/20/13 0200 05/20/13 0543 05/20/13 0759 05/20/13 0949  BP: 130/89 125/77  106/65  Pulse: 78 100  77  Temp: 98.2 F (36.8 C) 98.5 F (36.9 C)  97.7 F (36.5 C)  TempSrc: Oral Oral  Oral  Resp: 18 20  18   Height:      Weight:      SpO2: 96% 96% 98% 98%   CBG (last 3)   Recent Labs  05/19/13 1744 05/19/13 2141 05/20/13 0639  GLUCAP 100* 149* 103*    IV Fluid Intake:   . sodium chloride 75 mL/hr at 05/19/13 0232   Current Facility-Administered Medications  Medication Dose Route Frequency Provider Last Rate Last Dose  . 0.9 %  sodium chloride infusion   Intravenous Continuous Ulice Dash, PA-C 75 mL/hr at 05/20/13 1827    . acetaminophen (TYLENOL) tablet 650 mg  650 mg Oral Q4H PRN Ulice Dash, PA-C   650 mg at 05/22/13 1610   Or  . acetaminophen (TYLENOL) suppository 650 mg  650 mg Rectal Q4H PRN Ulice Dash, PA-C      . albuterol (PROVENTIL)  (5 MG/ML) 0.5% nebulizer solution 2.5 mg  2.5 mg Nebulization Q8H PRN Omelia Blackwater, DO   2.5 mg at 05/18/13 1400   And  . ipratropium (ATROVENT) nebulizer solution 0.5 mg  0.5 mg Nebulization Q8H PRN Omelia Blackwater, DO   0.5 mg at 05/18/13 1400  . antiseptic oral rinse (BIOTENE) solution 15 mL  15 mL Mouth Rinse BID Ritta Slot, MD   15 mL at 05/22/13 0800  . atorvastatin (LIPITOR) tablet 40 mg  40 mg Oral q1800 Omelia Blackwater, DO   40 mg at 05/21/13 1807  . HYDROmorphone (DILAUDID) injection 1-2 mg  1-2 mg Intravenous Q4H PRN Noel Christmas   1 mg at 05/21/13 1118  . insulin aspart (novoLOG) injection 0-9 Units  0-9 Units Subcutaneous TID WC Ritta Slot, MD      . labetalol (NORMODYNE,TRANDATE) injection 10 mg  10 mg Intravenous Q10 min PRN Ulice Dash, PA-C      . mometasone-formoterol [facility]) 100-5 MCG/ACT inhaler 2 puff  2 puff Inhalation BID Omelia Blackwater, DO   2 puff at 05/22/13 [phone]  . pantoprazole sodium (PROTONIX) 40 mg/20 mL oral suspension 40 mg  40 mg Oral QHS Micki Riley, MD   40 mg at 05/21/13 2110  . Rivaroxaban (XARELTO) tablet 15 mg  15 mg Oral Q supper Cathlyn Parsons, PA-C   15 mg at 05/21/13 1807  . senna-docusate (Senokot-S) tablet 1 tablet  1 tablet Oral QHS PRN Ulice Dash, PA-C      . tiotropium [facility]) inhalation capsule 18 mcg  18 mcg Inhalation Daily Omelia Blackwater, DO   18 mcg at 05/22/13 0840    Diet:  Dysphagia  Activity:  ambulate DVT Prophylaxis: compression devices  CLINICALLY SIGNIFICANT STUDIES Basic Metabolic Panel:   Recent Labs Lab 05/20/13 0702  NA 139  K 3.4*  CL 104  CO2 27  GLUCOSE 109*  BUN 8  CREATININE 0.37*  CALCIUM 8.0*   Liver Function Tests:   Recent Labs Lab 05/20/13 0702  AST 20  ALT 12  ALKPHOS 53  BILITOT 0.6  PROT 5.6*  ALBUMIN 2.6*   CBC:   Recent Labs Lab 05/20/13 0702  WBC 7.3  NEUTROABS 5.1  HGB 10.9*  HCT 34.6*  MCV 93.3  PLT 185   Coagulation: No  results found for this basename: LABPROT, INR,  in the last 168 hours Cardiac Enzymes: No results found for this basename: CKTOTAL, CKMB, CKMBINDEX, TROPONINI,  in the last 168 hours Urinalysis: No results found for this basename: COLORURINE, APPERANCEUR, LABSPEC, PHURINE, GLUCOSEU, HGBUR, BILIRUBINUR, KETONESUR, PROTEINUR, UROBILINOGEN, NITRITE, LEUKOCYTESUR,  in the last 168 hours Lipid Panel    Component Value Date/Time   CHOL 216* 05/18/2013 0430   TRIG 155* 05/18/2013 0430   HDL 71 05/18/2013 0430   CHOLHDL 3.0 05/18/2013 0430   VLDL 31 05/18/2013 0430   LDLCALC 114* 05/18/2013 0430   HgbA1C  Lab Results  Component Value Date   HGBA1C 6.2* 05/18/2013    Urine Drug Screen:   No results found for this basename: labopia,  cocainscrnur,  labbenz,  amphetmu,  thcu,  labbarb    Alcohol Level: No results found for this basename: ETH,  in the last 168 hours    CT of the brain    MRI of the brain   9/20 MRI HEAD IMPRESSION  Multiple areas of acute infarct in the cerebral hemispheres  bilaterally consistent with cerebral emboli. Largest infarct in the  left posterior frontal cortex. Generalized atrophy and chronic  microvascular ischemic changes in the white matter.   MRA of the brain   9/20 MRA HEAD IMPRESSION  Negative intracranial MRA  2D Echocardiogram EF 55%, wall  Motion normal, left atrium mildly dilated. No ASD or PFO identified.   Carotid Doppler: Preliminary report: Bilateral: 1-39% ICA stenosis. Vertebral artery flow is antegrade.   CXR    Therapy Recommendations CIR  Physical Exam   Gen:NAD HEENT- Normocephalic, no lesions, without obvious abnormality. Normal external eye and conjunctiva. Normal TM's bilaterally. Normal auditory canals and external ears. Normal external nose, mucus membranes and septum. Normal pharynx.  Neck supple with no masses, nodes, nodules or enlargement.  Cardiovascular - regular rate and rhythm, S1, S2 normal, no murmur, click, rub or gallop   Lungs - chest clear, no wheezing, rales, normal symmetric air entry, Heart exam - S1, S2 normal, no murmur, no gallop, rate regular  Abdomen - soft, non-tender; bowel sounds normal; no masses, no organomegaly  Extremities - no edema  Neurologic Examination:  Mental Status:  Alert, markedly dysarthric with expressive aphasia. Able to follow 3 step commands without difficulty.  Cranial Nerves:  II: Discs flat bilaterally; Visual fields grossly normal, pupils equal, round, reactive to light and accommodation  III,IV, VI: ptosis not present,  extra-ocular motions intact bilaterally  V,VII: right facial droop, facial light touch sensation normal bilaterally  VIII: hearing normal bilaterally  IX,X: gag reflex present  XI: bilateral shoulder shrug  XII: midline tongue extension  Motor:  Right : Upper extremity 5/5 Left: Upper extremity 5/5  Lower extremity 5/5 Lower extremity 5/5  Tone and bulk:normal tone throughout; no atrophy noted  Sensory: Pinprick and light touch intact throughout, bilaterally  Deep Tendon Reflexes:  Depressed throughout  Plantars:  Mute bilaterally  Cerebellar:  normal finger-to-nose, normal heel-to-shin test  Gait: not tested  ASSESSMENT Melanie Wood is a 79 y.o. female presenting with right facial droop, slurred speech and right arm drift. Status post IV t-PA.. Imaging pending. Infarct felt to be  embolic secondary to holding Xarelto for surgical procedure.  On Xarelto prior to admission. Restarted Xarelto 9/20 after MRI shows no hemorrhage. Patient with resultant dysarthria and marked expressive aphasia.    Ischemic infarct likely 2/2 embolic event from A fib/discontinuation of Xarelto  S/p melanoma excision with wound vac in place  Hyperlipidemia, LDL 114, statin started  A fib  HbA1c of 6.2   Hospital day # 3  TREATMENT/PLAN  Continue xarelto.  Risk factor modification  To CIR today if bed available.  Patient to follow up with Dr.  Pearlean Brownie in 2 months.  Gwendolyn Lima. Manson Passey, [facility], MBA, MHA [facility] Pager: [phone] 05/20/2013 10:25 PM  I have personally obtained a history, examined the patient, evaluated imaging results, and formulated the assessment and plan of care. I agree with the above. Delia Heady, MD

## 2013-05-23 ENCOUNTER — Inpatient Hospital Stay (HOSPITAL_COMMUNITY)
Admission: RE | Admit: 2013-05-23 | Discharge: 2013-06-03 | DRG: 945 | Disposition: A | Discharge status: Home Health | Payer: Medicare Other | Source: Intra-hospital | Attending: Physical Medicine & Rehabilitation | Admitting: Physical Medicine & Rehabilitation

## 2013-05-23 DIAGNOSIS — Z7982 Long term (current) use of aspirin: Secondary | ICD-10-CM | POA: Diagnosis not present

## 2013-05-23 DIAGNOSIS — Z87891 Personal history of nicotine dependence: Secondary | ICD-10-CM

## 2013-05-23 DIAGNOSIS — I10 Essential (primary) hypertension: Secondary | ICD-10-CM | POA: Diagnosis present

## 2013-05-23 DIAGNOSIS — I6340 Cerebral infarction due to embolism of unspecified cerebral artery: Secondary | ICD-10-CM | POA: Diagnosis present

## 2013-05-23 DIAGNOSIS — J4489 Other specified chronic obstructive pulmonary disease: Secondary | ICD-10-CM | POA: Diagnosis present

## 2013-05-23 DIAGNOSIS — J449 Chronic obstructive pulmonary disease, unspecified: Secondary | ICD-10-CM | POA: Diagnosis present

## 2013-05-23 DIAGNOSIS — I4891 Unspecified atrial fibrillation: Secondary | ICD-10-CM | POA: Diagnosis present

## 2013-05-23 DIAGNOSIS — Z85820 Personal history of malignant melanoma of skin: Secondary | ICD-10-CM

## 2013-05-23 DIAGNOSIS — K5900 Constipation, unspecified: Secondary | ICD-10-CM | POA: Diagnosis not present

## 2013-05-23 DIAGNOSIS — F0390 Unspecified dementia without behavioral disturbance: Secondary | ICD-10-CM | POA: Diagnosis present

## 2013-05-23 DIAGNOSIS — C439 Malignant melanoma of skin, unspecified: Secondary | ICD-10-CM | POA: Diagnosis present

## 2013-05-23 DIAGNOSIS — E785 Hyperlipidemia, unspecified: Secondary | ICD-10-CM | POA: Diagnosis present

## 2013-05-23 DIAGNOSIS — Z5189 Encounter for other specified aftercare: Secondary | ICD-10-CM | POA: Diagnosis present

## 2013-05-23 DIAGNOSIS — Z79899 Other long term (current) drug therapy: Secondary | ICD-10-CM

## 2013-05-23 DIAGNOSIS — R4701 Aphasia: Secondary | ICD-10-CM | POA: Diagnosis present

## 2013-05-23 DIAGNOSIS — E876 Hypokalemia: Secondary | ICD-10-CM | POA: Diagnosis present

## 2013-05-23 DIAGNOSIS — I639 Cerebral infarction, unspecified: Secondary | ICD-10-CM | POA: Diagnosis present

## 2013-05-23 DIAGNOSIS — R29810 Facial weakness: Secondary | ICD-10-CM | POA: Diagnosis present

## 2013-05-23 LAB — GLUCOSE, CAPILLARY
Glucose-Capillary: 91 mg/dL (ref 70–99)
Glucose-Capillary: 111 mg/dL — ABNORMAL HIGH (ref 70–99)
Glucose-Capillary: 145 mg/dL — ABNORMAL HIGH (ref 70–99)

## 2013-05-23 MED ORDER — PROCHLORPERAZINE EDISYLATE 5 MG/ML IJ SOLN
5.0000 mg | Freq: Four times a day (QID) | INTRAMUSCULAR | Status: DC | PRN
  Filled 2013-05-23: qty 2

## 2013-05-23 MED ORDER — LISINOPRIL 2.5 MG PO TABS
1.2500 mg | ORAL_TABLET | Freq: Every day | ORAL | Status: DC
  Administered 2013-05-24 – 2013-05-25 (×2): 1.25 mg via ORAL
  Filled 2013-05-23: qty 0.5

## 2013-05-23 MED ORDER — IPRATROPIUM BROMIDE 0.02 % IN SOLN: 0.5000 mg | Freq: Three times a day (TID) | RESPIRATORY_TRACT | Status: DC | PRN

## 2013-05-23 MED ORDER — ACETAMINOPHEN 325 MG PO TABS
325.0000 mg | ORAL_TABLET | ORAL | Status: DC | PRN
  Administered 2013-05-23 – 2013-06-02 (×25): 650 mg via ORAL
  Filled 2013-05-23 (×25): qty 2

## 2013-05-23 MED ORDER — BISACODYL 10 MG RE SUPP
10.0000 mg | Freq: Every day | RECTAL | Status: DC | PRN
  Administered 2013-05-29: 10 mg via RECTAL
  Filled 2013-05-23: qty 1

## 2013-05-23 MED ORDER — ATORVASTATIN CALCIUM 40 MG PO TABS
40.0000 mg | ORAL_TABLET | Freq: Every day | ORAL | Status: DC
  Administered 2013-05-23 – 2013-06-02 (×11): 40 mg via ORAL
  Filled 2013-05-23 (×12): qty 1

## 2013-05-23 MED ORDER — ALBUTEROL SULFATE (5 MG/ML) 0.5% IN NEBU
2.5000 mg | INHALATION_SOLUTION | Freq: Three times a day (TID) | RESPIRATORY_TRACT | Status: DC
  Administered 2013-05-23: 2.5 mg via RESPIRATORY_TRACT
  Filled 2013-05-23: qty 0.5

## 2013-05-23 MED ORDER — GUAIFENESIN ER 600 MG PO TB12
600.0000 mg | ORAL_TABLET | Freq: Two times a day (BID) | ORAL | Status: DC
  Administered 2013-05-23 – 2013-06-03 (×22): 600 mg via ORAL
  Filled 2013-05-23 (×24): qty 1

## 2013-05-23 MED ORDER — IPRATROPIUM BROMIDE 0.02 % IN SOLN
0.5000 mg | Freq: Three times a day (TID) | RESPIRATORY_TRACT | Status: DC
  Administered 2013-05-23: 0.5 mg via RESPIRATORY_TRACT
  Filled 2013-05-23: qty 2.5

## 2013-05-23 MED ORDER — TRAMADOL HCL 50 MG PO TABS
50.0000 mg | ORAL_TABLET | Freq: Four times a day (QID) | ORAL | Status: DC | PRN
  Administered 2013-05-24 – 2013-06-02 (×23): 50 mg via ORAL
  Filled 2013-05-23 (×25): qty 1

## 2013-05-23 MED ORDER — PROCHLORPERAZINE 25 MG RE SUPP
12.5000 mg | Freq: Four times a day (QID) | RECTAL | Status: DC | PRN
  Filled 2013-05-23: qty 1

## 2013-05-23 MED ORDER — PANTOPRAZOLE SODIUM 40 MG PO PACK
40.0000 mg | PACK | Freq: Every day | ORAL | Status: DC
  Filled 2013-05-23: qty 20

## 2013-05-23 MED ORDER — ALBUTEROL SULFATE (5 MG/ML) 0.5% IN NEBU: 2.5000 mg | INHALATION_SOLUTION | Freq: Three times a day (TID) | RESPIRATORY_TRACT | Status: DC

## 2013-05-23 MED ORDER — ALBUTEROL SULFATE (5 MG/ML) 0.5% IN NEBU
2.5000 mg | INHALATION_SOLUTION | RESPIRATORY_TRACT | Status: DC | PRN
  Administered 2013-05-28 – 2013-06-02 (×3): 2.5 mg via RESPIRATORY_TRACT
  Filled 2013-05-23 (×3): qty 0.5

## 2013-05-23 MED ORDER — LISINOPRIL 2.5 MG PO TABS
1.2500 mg | ORAL_TABLET | Freq: Every day | ORAL | Status: DC
  Filled 2013-05-23: qty 0.5

## 2013-05-23 MED ORDER — BENZONATATE 100 MG PO CAPS
100.0000 mg | ORAL_CAPSULE | Freq: Three times a day (TID) | ORAL | Status: DC | PRN
  Filled 2013-05-23: qty 1

## 2013-05-23 MED ORDER — TIOTROPIUM BROMIDE MONOHYDRATE 18 MCG IN CAPS
18.0000 ug | ORAL_CAPSULE | Freq: Every day | RESPIRATORY_TRACT | Status: DC
  Administered 2013-05-24 – 2013-06-03 (×7): 18 ug via RESPIRATORY_TRACT
  Filled 2013-05-23 (×3): qty 5

## 2013-05-23 MED ORDER — PROCHLORPERAZINE MALEATE 5 MG PO TABS
5.0000 mg | ORAL_TABLET | Freq: Four times a day (QID) | ORAL | Status: DC | PRN
  Filled 2013-05-23: qty 2

## 2013-05-23 MED ORDER — ALUM & MAG HYDROXIDE-SIMETH 200-200-20 MG/5ML PO SUSP: 30.0000 mL | ORAL | Status: DC | PRN

## 2013-05-23 MED ORDER — BIOTENE DRY MOUTH MT LIQD
15.0000 mL | Freq: Two times a day (BID) | OROMUCOSAL | Status: DC
  Administered 2013-05-23 – 2013-06-03 (×18): 15 mL via OROMUCOSAL

## 2013-05-23 MED ORDER — PANTOPRAZOLE SODIUM 40 MG PO TBEC
40.0000 mg | DELAYED_RELEASE_TABLET | Freq: Every day | ORAL | Status: DC
  Administered 2013-05-23 – 2013-06-01 (×10): 40 mg via ORAL
  Filled 2013-05-23 (×15): qty 1

## 2013-05-23 MED ORDER — ALBUTEROL SULFATE (5 MG/ML) 0.5% IN NEBU: 2.5000 mg | INHALATION_SOLUTION | Freq: Three times a day (TID) | RESPIRATORY_TRACT | Status: DC | PRN

## 2013-05-23 MED ORDER — FLEET ENEMA 7-19 GM/118ML RE ENEM
1.0000 | ENEMA | Freq: Once | RECTAL | Status: AC | PRN
  Filled 2013-05-23: qty 1

## 2013-05-23 MED ORDER — IPRATROPIUM BROMIDE 0.02 % IN SOLN: 0.5000 mg | Freq: Three times a day (TID) | RESPIRATORY_TRACT | Status: DC

## 2013-05-23 MED ORDER — MOMETASONE FURO-FORMOTEROL FUM 100-5 MCG/ACT IN AERO
2.0000 | INHALATION_SPRAY | Freq: Two times a day (BID) | RESPIRATORY_TRACT | Status: DC
  Administered 2013-05-23 – 2013-06-03 (×19): 2 via RESPIRATORY_TRACT
  Filled 2013-05-23 (×2): qty 8.8

## 2013-05-23 MED ORDER — RIVAROXABAN 15 MG PO TABS
15.0000 mg | ORAL_TABLET | Freq: Every day | ORAL | Status: DC
  Administered 2013-05-23 – 2013-06-02 (×11): 15 mg via ORAL
  Filled 2013-05-23 (×12): qty 1

## 2013-05-23 MED ORDER — SENNOSIDES-DOCUSATE SODIUM 8.6-50 MG PO TABS: 1.0000 | ORAL_TABLET | Freq: Every evening | ORAL | Status: DC | PRN

## 2013-05-23 MED ORDER — GUAIFENESIN-DM 100-10 MG/5ML PO SYRP: 5.0000 mL | ORAL_SOLUTION | Freq: Four times a day (QID) | ORAL | Status: DC | PRN

## 2013-05-23 MED ORDER — INSULIN ASPART 100 UNIT/ML ~~LOC~~ SOLN: 0.0000 [IU] | Freq: Three times a day (TID) | SUBCUTANEOUS | Status: DC

## 2013-05-23 MED ORDER — IPRATROPIUM BROMIDE 0.02 % IN SOLN
0.5000 mg | RESPIRATORY_TRACT | Status: DC | PRN
Start: 2013-05-23 — End: 2013-06-03
  Administered 2013-05-28: 0.5 mg via RESPIRATORY_TRACT
  Filled 2013-05-23: qty 2.5

## 2013-05-23 NOTE — Progress Notes (Signed)
I have insurance approval to admit pt to inpt rehab and bed is available. Patient and daughter are in agreement to admit today. I have discussed with Dr. Pearlean Brownie. [phone]

## 2013-05-23 NOTE — Progress Notes (Signed)
Discussed with PA  Dianna Ewald M. Emrys Mceachron, MD, FACS General, Bariatric, & Minimally Invasive Surgery [facility]  

## 2013-05-23 NOTE — Progress Notes (Signed)
ANTICOAGULATION CONSULT NOTE - Initial Consult  Pharmacy Consult for Xarelto Indication: atrial fibrillation / s/p recent CVA  Allergies  Allergen Reactions  . Codeine Other (See Comments)    GI distress  . Shellfish Allergy Diarrhea and Nausea And Vomiting  . Penicillins Itching and Rash    Patient Measurements:  Total Body Weight: 82kg  Vital Signs: Temp: 97.6 F (36.4 C) (09/25 1427) Temp src: Oral (09/25 1427) BP: 163/70 mmHg (09/25 1427) Pulse Rate: 69 (09/25 1427)  Labs:  Recent Labs  05/22/13 0805  HGB 11.0*  HCT 34.9*  PLT 220    The CrCl is unknown because both a height and weight (above a minimum accepted value) are required for this calculation.   Medical History: Past Medical History  Diagnosis Date  . Dementia   . A-fib   . Hypertension      Assessment: 79 y.o. female with hx of A Fib who recently was taken off her Xarelto for 4 days in order to have a melanoma removed. 9/19 she was admitted for CVA while off anticoagulation and received tPA.  xarelto 15mg  daily - her home dose has been restarted.  CBC stable, renal fx stable.  She is now transferred to rehab.   Goal of Therapy:   Monitor platelets by anticoagulation protocol: Yes   Plan:  Xarelto 15mg  q evening  Monitor for s/s bleeding   Leota Sauers Pharm.D. CPP, BCPS Clinical Pharmacist [phone] 05/23/2013 5:28 PM

## 2013-05-23 NOTE — Progress Notes (Addendum)
Stroke Team Progress Note  HISTORY Melanie Wood is an 79 y.o. female with hx of A  Fib who recently was taken off her Xarelto for 4 days in order to have a melanoma removed. Today at 1115 she was noted to have right facial droop, slurred speech and right arm drift. [name] was brought to [facility] where code stroke was called. Patient was given tPA oat 1430 on 9/19 and transferred to [facility] hospital. On arrival patient was brought to 3100. Exam showed improved strength but patient remained dysarthric with expressive difficulties. She does have a wound vac on her left leg with a moderate amount of blood--general surgery has been consulted to see patient and make recommendations.   She was admitted to the neuro ICU  for further evaluation and treatment.  The patient's hospitalist apparently called the patient's neurologists, Dr. Sherryll Burger, and discussed tPA and it was decided to give tPA per family.   SUBJECTIVE Anticipating rehabilitation. No new neurological changes.   OBJECTIVE Most recent Vital Signs: Filed Vitals:   05/20/13 0200 05/20/13 0543 05/20/13 0759 05/20/13 0949  BP: 130/89 125/77  106/65  Pulse: 78 100  77  Temp: 98.2 F (36.8 C) 98.5 F (36.9 C)  97.7 F (36.5 C)  TempSrc: Oral Oral  Oral  Resp: 18 20  18   Height:      Weight:      SpO2: 96% 96% 98% 98%   CBG (last 3)   Recent Labs  05/19/13 1744 05/19/13 2141 05/20/13 0639  GLUCAP 100* 149* 103*    IV Fluid Intake:   . sodium chloride 75 mL/hr at 05/19/13 0232   Current Facility-Administered Medications  Medication Dose Route Frequency Provider Last Rate Last Dose  . 0.9 %  sodium chloride infusion   Intravenous Continuous Ulice Dash, PA-C 75 mL/hr at 05/20/13 1827    . acetaminophen (TYLENOL) tablet 650 mg  650 mg Oral Q4H PRN Ulice Dash, PA-C   650 mg at 05/23/13 4782   Or  . acetaminophen (TYLENOL) suppository 650 mg  650 mg Rectal Q4H PRN Ulice Dash, PA-C      . albuterol (PROVENTIL) (5  MG/ML) 0.5% nebulizer solution 2.5 mg  2.5 mg Nebulization Q8H PRN Omelia Blackwater, DO   2.5 mg at 05/18/13 1400   And  . ipratropium (ATROVENT) nebulizer solution 0.5 mg  0.5 mg Nebulization Q8H PRN Omelia Blackwater, DO   0.5 mg at 05/18/13 1400  . antiseptic oral rinse (BIOTENE) solution 15 mL  15 mL Mouth Rinse BID Ritta Slot, MD   15 mL at 05/22/13 2218  . atorvastatin (LIPITOR) tablet 40 mg  40 mg Oral q1800 Omelia Blackwater, DO   40 mg at 05/22/13 1713  . HYDROmorphone (DILAUDID) injection 1-2 mg  1-2 mg Intravenous Q4H PRN Noel Christmas   1 mg at 05/22/13 2216  . insulin aspart (novoLOG) injection 0-9 Units  0-9 Units Subcutaneous TID WC Ritta Slot, MD      . labetalol (NORMODYNE,TRANDATE) injection 10 mg  10 mg Intravenous Q10 min PRN Ulice Dash, PA-C      . mometasone-formoterol [facility]) 100-5 MCG/ACT inhaler 2 puff  2 puff Inhalation BID Omelia Blackwater, DO   2 puff at 05/22/13 2312  . pantoprazole sodium (PROTONIX) 40 mg/20 mL oral suspension 40 mg  40 mg Oral QHS Micki Riley, MD   40 mg at 05/22/13 2259  . Rivaroxaban (XARELTO) tablet 15 mg  15 mg  Oral Q supper Cathlyn Parsons, PA-C   15 mg at 05/22/13 1713  . senna-docusate (Senokot-S) tablet 1 tablet  1 tablet Oral QHS PRN Ulice Dash, PA-C      . tiotropium [facility]) inhalation capsule 18 mcg  18 mcg Inhalation Daily Omelia Blackwater, DO   18 mcg at 05/22/13 0840    Diet:  Dysphagia 3 Activity:  ambulate DVT Prophylaxis: compression devices  CLINICALLY SIGNIFICANT STUDIES Basic Metabolic Panel:   Recent Labs Lab 05/20/13 0702  NA 139  K 3.4*  CL 104  CO2 27  GLUCOSE 109*  BUN 8  CREATININE 0.37*  CALCIUM 8.0*   Liver Function Tests:   Recent Labs Lab 05/20/13 0702  AST 20  ALT 12  ALKPHOS 53  BILITOT 0.6  PROT 5.6*  ALBUMIN 2.6*   CBC:   Recent Labs Lab 05/20/13 0702  WBC 7.3  NEUTROABS 5.1  HGB 10.9*  HCT 34.6*  MCV 93.3  PLT 185   Coagulation: No  results found for this basename: LABPROT, INR,  in the last 168 hours Cardiac Enzymes: No results found for this basename: CKTOTAL, CKMB, CKMBINDEX, TROPONINI,  in the last 168 hours Urinalysis: No results found for this basename: COLORURINE, APPERANCEUR, LABSPEC, PHURINE, GLUCOSEU, HGBUR, BILIRUBINUR, KETONESUR, PROTEINUR, UROBILINOGEN, NITRITE, LEUKOCYTESUR,  in the last 168 hours Lipid Panel    Component Value Date/Time   CHOL 216* 05/18/2013 0430   TRIG 155* 05/18/2013 0430   HDL 71 05/18/2013 0430   CHOLHDL 3.0 05/18/2013 0430   VLDL 31 05/18/2013 0430   LDLCALC 114* 05/18/2013 0430   HgbA1C  Lab Results  Component Value Date   HGBA1C 6.2* 05/18/2013    Urine Drug Screen:   No results found for this basename: labopia,  cocainscrnur,  labbenz,  amphetmu,  thcu,  labbarb    Alcohol Level: No results found for this basename: ETH,  in the last 168 hours    CT of the brain    MRI of the brain   9/20 MRI HEAD IMPRESSION  Multiple areas of acute infarct in the cerebral hemispheres  bilaterally consistent with cerebral emboli. Largest infarct in the  left posterior frontal cortex. Generalized atrophy and chronic  microvascular ischemic changes in the white matter.   MRA of the brain   9/20 MRA HEAD IMPRESSION  Negative intracranial MRA  2D Echocardiogram EF 55%, wall  Motion normal, left atrium mildly dilated. No ASD or PFO identified.   Carotid Doppler: Preliminary report: Bilateral: 1-39% ICA stenosis. Vertebral artery flow is antegrade.   CXR    Therapy Recommendations CIR  Physical Exam   Gen:NAD HEENT- Normocephalic, no lesions, without obvious abnormality. Normal external eye and conjunctiva. Normal TM's bilaterally. Normal auditory canals and external ears. Normal external nose, mucus membranes and septum. Normal pharynx.  Neck supple with no masses, nodes, nodules or enlargement.  Cardiovascular - regular rate and rhythm, S1, S2 normal, no murmur, click, rub or gallop   Lungs - chest clear, no wheezing, rales, normal symmetric air entry, Heart exam - S1, S2 normal, no murmur, no gallop, rate regular  Abdomen - soft, non-tender; bowel sounds normal; no masses, no organomegaly  Extremities - no edema  Neurologic Examination:  Mental Status:  Alert, markedly dysarthric with expressive aphasia. Able to follow 3 step commands without difficulty.  Cranial Nerves:  II: Discs flat bilaterally; Visual fields grossly normal, pupils equal, round, reactive to light and accommodation  III,IV, VI: ptosis not present, extra-ocular motions  intact bilaterally  V,VII: right facial droop, facial light touch sensation normal bilaterally  VIII: hearing normal bilaterally  IX,X: gag reflex present  XI: bilateral shoulder shrug  XII: midline tongue extension  Motor:  Right : Upper extremity 5/5 Left: Upper extremity 5/5  Lower extremity 5/5 Lower extremity 5/5  Tone and bulk:normal tone throughout; no atrophy noted  Sensory: Pinprick and light touch intact throughout, bilaterally  Deep Tendon Reflexes:  Depressed throughout  Plantars:  Mute bilaterally  Cerebellar:  normal finger-to-nose, normal heel-to-shin test  Gait: not tested  ASSESSMENT [name] is a 79 y.o. female presenting with right facial droop, slurred speech and right arm drift. Status post IV t-PA. Imaging shows acute infarcts in the cerebral hemispheres bilaterally consistent with cerebral emboli.. Infarct felt to be embolic (patient hx of afib) secondary to holding Xarelto for surgical procedure.  On Xarelto prior to admission. Restarted Xarelto 9/20 after MRI shows no hemorrhage. Patient with resultant dysarthria and marked expressive aphasia.    Ischemic infarct likely 2/2 embolic event from A fib/discontinuation of Xarelto  S/p melanoma excision with wound vac in place  Hyperlipidemia, LDL 114, statin started  A fib  HbA1c of 6.2   Hospital day # 3  TREATMENT/PLAN  Continue  xarelto.  Risk factor modification  To CIR today  Patient to follow up with Dr. Pearlean Brownie in 2 months in stroke clinic.  Gwendolyn Lima. Manson Passey, [facility], MBA, MHA [facility] Pager: [phone] 05/23/2013 11:53 AM  I have personally obtained a history, examined the patient, evaluated imaging results, and formulated the assessment and plan of care. I agree with the above.  Delia Heady, MD

## 2013-05-23 NOTE — H&P (Signed)
Physical Medicine and Rehabilitation Admission H&P  CC: Expressive deficits, right facial weakness, RLE weakness.  HPI: Melanie Wood is a 79 y.o. RH-female with history of A fib, HTN, who was taken off Xarelto to have melanoma removed. On 05/17/13, she was admitted with slurred speech with expressive difficulties, right facial droop and RUE weakness. MRI/MRA brain showed acute bilateral cerebral infarcts in bilateral frontal lobes (-largest in left posterior), Bilateral occipital and rigth thalamus; no stenosis. 2D echo with EF 55-60% and no wall abnormality. Neurology consulted and recommended resuming Xarelto for embolic stroke due to A Fib. CCS consulted for input on VAC and dressing changed yesterday. Patient continues with expressive deficits as well as reading deficits with suspicion of visual deficits, right sided weakness as well as problems with mobility. Therapies ongoing and CIR recommended by team.  ROS  Unable to obtain secondary to aphasia Past Medical History   Diagnosis  Date   .  Dementia    .  A-fib    .  Hypertension     Past Surgical History   Procedure  Laterality  Date   .  Skin graft     .  Skin surgery      History reviewed. No pertinent family history.  Social History: Lives in an independent ALF--has help with meals and housework. reports that she quit smoking about 25 years ago. Her smoking use included Cigarettes. She smoked 0.00 packs per day. She does not have any smokeless tobacco history on file. Her alcohol and drug histories are not on file.  Allergies   Allergen  Reactions   .  Codeine  Other (See Comments)     GI distress   .  Shellfish Allergy  Diarrhea and Nausea And Vomiting   .  Penicillins  Itching and Rash    Medications Prior to Admission   Medication  Sig  Dispense  Refill   .  Acetaminophen-Codeine (TYLENOL/CODEINE #3) 300-30 MG per tablet  Take 1-2 tablets by mouth every 4 (four) hours as needed for pain.     Marland Kitchen  aspirin 81 MG tablet  Take  81 mg by mouth daily.     .  benzonatate (TESSALON) 100 MG capsule  Take 100 mg by mouth 3 (three) times daily as needed for cough.     .  carvedilol (COREG) 6.25 MG tablet  Take 6.25 mg by mouth 2 (two) times daily with a meal.     .  donepezil (ARICEPT) 10 MG tablet  Take 10 mg by mouth at bedtime.     .  Fluticasone-Salmeterol (ADVAIR) 100-50 MCG/DOSE AEPB  Inhale 1 puff into the lungs every 12 (twelve) hours.     .  furosemide (LASIX) 20 MG tablet  Take 20 mg by mouth daily.     Marland Kitchen  ipratropium-albuterol (DUONEB) 0.5-2.5 (3) MG/3ML SOLN  Take 3 mLs by nebulization every 8 (eight) hours as needed.     Marland Kitchen  L-Methylfolate-Algae-B12-B6 (METANX) 3-90.314-2-35 MG CAPS  Take 1 tablet by mouth daily.     Marland Kitchen  lisinopril (PRINIVIL,ZESTRIL) 2.5 MG tablet  Take 1.25 mg by mouth daily.     Marland Kitchen  lisinopril-hydrochlorothiazide (PRINZIDE,ZESTORETIC) 20-12.5 MG per tablet  Take 1 tablet by mouth daily.     Marland Kitchen  nystatin (MYCOSTATIN/NYSTOP) 100000 UNIT/GM POWD  Apply 1 g topically 3 (three) times daily.     .  pantoprazole (PROTONIX) 40 MG injection  Inject 40 mg into the vein as needed (for GERD).     Marland Kitchen  potassium chloride SA (K-DUR,KLOR-CON) 20 MEQ tablet  Take 10 mEq by mouth daily.     .  Rivaroxaban (XARELTO) 15 MG TABS tablet  Take 15 mg by mouth daily.     Marland Kitchen  tiotropium (SPIRIVA) 18 MCG inhalation capsule  Place 18 mcg into inhaler and inhale daily.     Home:  Home Living  Family/patient expects to be discharged to:: Assisted living  Living Arrangements: Other (Comment) (at ALF since 12/2012)  Available Help at Discharge: Available 24 hours/day;Family  Type of Home: Assisted living  Home Layout: One level  Home Equipment: Environmental consultant - 2 wheels  Lives With: Alone;Other (Comment) (spouse lives in the Memory care unit)  Functional History:  Prior Function  Vocation: Retired  Comments: Ambulates to dining hall at ALF with RW and no assistance  Functional Status:  Mobility:  Bed Mobility  Bed Mobility: Not  assessed  Supine to Sit: 3: Mod assist;With rails  Sitting - Scoot to [place] of Bed: 3: Mod assist;With rail  Transfers  Transfers: Sit to Stand;Stand to Sit  Sit to Stand: 4: Min assist;With upper extremity assist;With armrests;From chair/3-in-1;From toilet  Sit to Stand: Patient Percentage: 60%  Stand to Sit: 4: Min guard;With upper extremity assist;With armrests;To chair/3-in-1;To toilet  Stand to Sit: Patient Percentage: 60%  Stand Pivot Transfers: 1: +2 Total assist  Stand Pivot Transfers: Patient Percentage: 70%  Ambulation/Gait  Ambulation/Gait Assistance: 4: Min guard  Ambulation Distance (Feet): 100 Feet  Assistive device: Rolling walker  Ambulation/Gait Assistance Details: cues for posture & to look ahead for increased awareness of environment. Pt c/o back pain throughout duration of ambulation. Pt with slight limp.  Gait Pattern: Step-through pattern;Decreased stride length  Stairs: No  Wheelchair Mobility  Wheelchair Mobility: No  ADL:  ADL  Eating/Feeding: Set up  Where Assessed - Eating/Feeding: Chair  Lower Body Dressing: Maximal assistance (don panties over wound vac and jp drain)  Where Assessed - Lower Body Dressing: Supported sit to Scientist, research (life sciences): Moderate assistance  Toilet Transfer Method: Sit to stand  Toilet Transfer Equipment: Raised toilet seat with arms (or 3-in-1 over toilet)  Equipment Used: Rolling walker;Gait belt  Transfers/Ambulation Related to ADLs: Pt ambulated with (A) to progress RW. pt needed cues to keep Rw from running into walker on the right side.  ADL Comments: Pt in chair on arrival with daughter present. Daughter is PTA at [employer]. Patient is a retired Charity fundraiser. pt using dry erase board to write request. Pt asked to write name pt writes "Melanie Wood" Pt laughs and rewrites last name. pt with additional letters and missing letters during written communication. Pt writes "Wood" when trying to participate in conversation to ask  PT if she went to Melanie Wood. Very appropriate question and response.Pt don underwear this session. Pt placed pad in underwear and total (A) to don bil LEs into panties. Pt able to hold pull up panites at calf. Pt unable to pull panties completely up and requires BIL UE on RW for support in static standing. Pt with incr ambulation this session.  Cognition:  Cognition  Overall Cognitive Status: Impaired/Different from baseline  Arousal/Alertness: Awake/alert  Orientation Level: Oriented X4  Attention: Sustained  Sustained Attention: Impaired  Sustained Attention Impairment: Verbal complex;Verbal basic;Functional basic  Memory: Appears intact  Awareness: Appears intact  Problem Solving: Impaired  Problem Solving Impairment: Functional basic  Behaviors: Restless  Cognition  Arousal/Alertness: Awake/alert  Behavior During Therapy: Flat affect  Overall Cognitive Status: Impaired/Different from  baseline  Area of Impairment: Safety/judgement  Current Attention Level: Selective  Safety/Judgement: Decreased awareness of safety;Decreased awareness of deficits  Problem Solving: Slow processing  General Comments: Pt using white board to communicate.  Physical Exam:  Blood pressure 178/68, pulse 70, temperature 97.7 F (36.5 C), temperature source Oral, resp. rate 20, height 5\' 2"  (1.575 m), weight 82.2 kg (181 lb 3.5 oz), SpO2 100.00%.  Nursing note and vitals reviewed.  Constitutional: She is oriented to person, place, and time. She appears well-developed and well-nourished.  HENT:  Head: Normocephalic and atraumatic.  Eyes: Conjunctivae are normal. Pupils are equal, round, and reactive to light.  Neck: Normal range of motion. Neck supple.  Cardiovascular: Normal rate and regular rhythm.  Pulmonary/Chest: Effort normal and breath sounds normal. No respiratory distress. She has no wheezes.  Abdominal: Soft. Bowel sounds are normal. She exhibits no distension.  Musculoskeletal: Healed  incision over Left knee .  Neurological: She is alert and oriented to person, place, and time.  Expressive deficits but able to answer basic questions with some hesitation. Used dry erase board to write but words jumbled and broken. Decreased coordination with fine motor left greater than right. Speech is dysarthric. Had more difficulties with vision to the outer left fields. Easily frustrated but able to follow one and two steps commands without difficulty.  Remembers 2/3 objects with cues. Difficulty with repetition. Unable to repeat "ifs, ands, or buts" without errors Skin: Skin is warm and dry.  Psychiatric: She has a normal mood and affect. Her behavior is normal.  Results for orders placed during the hospital encounter of 05/17/13 (from the past 48 hour(s))   GLUCOSE, CAPILLARY Status: Abnormal    Collection Time    05/20/13 4:10 PM   Result  Value  Range    Glucose-Capillary  112 (*)  70 - 99 mg/dL   GLUCOSE, CAPILLARY Status: Abnormal    Collection Time    05/20/13 11:09 PM   Result  Value  Range    Glucose-Capillary  105 (*)  70 - 99 mg/dL    Comment 1  Documented in Chart     Comment 2  Notify RN    GLUCOSE, CAPILLARY Status: Abnormal    Collection Time    05/21/13 6:59 AM   Result  Value  Range    Glucose-Capillary  101 (*)  70 - 99 mg/dL    Comment 1  Notify RN     Comment 2  Documented in Chart    GLUCOSE, CAPILLARY Status: Abnormal    Collection Time    05/21/13 12:11 PM   Result  Value  Range    Glucose-Capillary  103 (*)  70 - 99 mg/dL    Comment 1  Notify RN     Comment 2  Documented in Chart    GLUCOSE, CAPILLARY Status: Abnormal    Collection Time    05/21/13 5:23 PM   Result  Value  Range    Glucose-Capillary  143 (*)  70 - 99 mg/dL    Comment 1  Notify RN     Comment 2  Documented in Chart    GLUCOSE, CAPILLARY Status: Abnormal    Collection Time    05/21/13 9:45 PM   Result  Value  Range    Glucose-Capillary  132 (*)  70 - 99 mg/dL    Comment 1   Documented in Chart     Comment 2  Notify RN    GLUCOSE, CAPILLARY Status: Abnormal  Collection Time    05/22/13 6:48 AM   Result  Value  Range    Glucose-Capillary  105 (*)  70 - 99 mg/dL    Comment 1  Documented in Chart     Comment 2  Notify RN    CBC Status: Abnormal    Collection Time    05/22/13 8:05 AM   Result  Value  Range    WBC  6.6  4.0 - 10.5 K/uL    RBC  3.74 (*)  3.87 - 5.11 MIL/uL    Hemoglobin  11.0 (*)  12.0 - 15.0 g/dL    HCT  16.1 (*)  09.6 - 46.0 %    MCV  93.3  78.0 - 100.0 fL    MCH  29.4  26.0 - 34.0 pg    MCHC  31.5  30.0 - 36.0 g/dL    RDW  04.5  40.9 - 81.1 %    Platelets  220  150 - 400 K/uL   GLUCOSE, CAPILLARY Status: Abnormal    Collection Time    05/22/13 11:54 AM   Result  Value  Range    Glucose-Capillary  111 (*)  70 - 99 mg/dL    Comment 1  Documented in Chart     No results found.  Post Admission Physician Evaluation:  1. Functional deficits secondary to an embolic bilateral frontal occipital and right thalamic infarct. 2. Patient is admitted to receive collaborative, interdisciplinary care between the physiatrist, rehab nursing staff, and therapy team. 3. Patient's level of medical complexity and substantial therapy needs in context of that medical necessity cannot be provided at a lesser intensity of care such as a SNF. 4. Patient has experienced substantial functional loss from his/her baseline which was documented above under the "Functional History" and "Functional Status" headings. Judging by the patient's diagnosis, physical exam, and functional history, the patient has potential for functional progress which will result in measurable gains while on inpatient rehab. These gains will be of substantial and practical use upon discharge in facilitating mobility and self-care at the household level. 5. Physiatrist will provide 24 hour management of medical needs as well as oversight of the therapy plan/treatment and provide guidance as  appropriate regarding the interaction of the two. 6. 24 hour rehab nursing will assist with bladder management, bowel management, safety, skin/wound care, disease management, medication administration, pain management and patient education and help integrate therapy concepts, techniques,education, etc. 7. PT will assess and treat for/with: pre gait, gait training, endurance , safety, equipment, neuromuscular re education. Goals are: Mod I mobility. 8. OT will assess and treat for/with: ADLs, Cognitive perceptual skills, Neuromuscular re education, safety, endurance, equipment. Goals are: Mod I ADLs. 9. SLP will assess and treat for/with: aphasia ,dysarthria. Goals are: express simple biographic info with 100% accuracy. 10. Case Management and Social Worker will assess and treat for psychological issues and discharge planning. 11. Team conference will be held weekly to assess progress toward goals and to determine barriers to discharge. 12. Patient will receive at least 3 hours of therapy per day at least 5 days per week. 13. ELOS: 7-10 days  14. Prognosis: good Medical Problem List and Plan:  1. DVT Prophylaxis/Anticoagulation: Pharmaceutical: Xarelto  2. Pain Management: Will add prn ultram.  3. Mood: LCSW to follow for evaluation.  4. Neuropsych: This patient is capable of making decisions on her own behalf.  5. HTN: Monitor with bid checks. Blood pressures labile---resume lisinopril  6. Melanoma left lateral  calf: Daily dressing change to left calf--to be kept moist with change of topper daily.  7. A fib: Monitor heart rate with bid checks as may increase with activity. Is off coreg at this time.  8. Hypokalemia: Recheck in am. Will supplement briefly--dilutional due to IVF. Discontinue IVF.  9. Frequency/urgency: Maybe related to IVF and CVA but will check UA/UCS.   Erick Colace M.D. [facility] FAAPM&R (Sports Med, Neuromuscular Med) Diplomate Am Board of  Electrodiagnostic Med Diplomate Am Board of Pain Medicine Fellow Am Board of Interventional Pain Physicians  05/22/2013

## 2013-05-23 NOTE — Progress Notes (Signed)
Physical Therapy Treatment Patient Details Name: Melanie Wood MRN: [id number] DOB: June 13, 1930 Today's Date: 05/23/2013 Time:  -     PT Assessment / Plan / Recommendation  History of Present Illness 79 y.o. female with hx of A Fib who recently was taken off her Xarelto for 4 days in order to have a melanoma removed from her left leg.  When she was admitted on 05/17/13 she was noted to have right facial droop, slurred speech and right arm drift. [name] was brought to [facility] where code stroke was called. Patient was given tPA and transferred to [facility] hospital. MRI revealed multiple areas of acute infarct in the cerebral hemispheres bilaterally consistent with cerebral emboli. Largest infarct in the left posterior frontal cortex.   PT Comments   Pt cont's to make progress with mobility.  Very motivated.     Follow Up Recommendations  CIR     Does the patient have the potential to tolerate intense rehabilitation     Barriers to Discharge        Equipment Recommendations       Recommendations for Other Services Rehab consult  Frequency Min 4X/week   Progress towards PT Goals Progress towards PT goals: Progressing toward goals  Plan Current plan remains appropriate    Precautions / Restrictions Precautions Precautions: Fall;Other (comment) Precaution Comments: JP drain left leg.  She no longer has the wound vag Restrictions Weight Bearing Restrictions: No   Pertinent Vitals/Pain Indicates Lt calf pain with ambulation.      Mobility  Bed Mobility Bed Mobility: Not assessed Transfers Transfers: Sit to Stand;Stand to Sit Sit to Stand: 4: Min assist;With upper extremity assist;With armrests;From chair/3-in-1 Stand to Sit: 4: Min assist;With upper extremity assist;With armrests;To chair/3-in-1 Details for Transfer Assistance: cues for hand placement.  (A) to stand & control descent Ambulation/Gait Ambulation/Gait Assistance: 4: Min guard Ambulation Distance (Feet): 150  Feet Assistive device: Rolling walker Ambulation/Gait Assistance Details: cues for posture, increased floor clearance.   Gait Pattern: Step-through pattern;Trunk flexed;Shuffle Stairs: No Corporate treasurer: No Modified Rankin (Stroke Patients Only) Pre-Morbid Rankin Score: No significant disability Modified Rankin: Moderately severe disability      PT Goals (current goals can now be found in the care plan section) Acute Rehab PT Goals Patient Stated Goal: Family goals is for pt to go to inpatient rehab; pt did not set due to limited vocalization  Visit Information  Last PT Received On: 05/23/13 Assistance Needed: +1 History of Present Illness: 79 y.o. female with hx of A Fib who recently was taken off her Xarelto for 4 days in order to have a melanoma removed from her left leg.  When she was admitted on 05/17/13 she was noted to have right facial droop, slurred speech and right arm drift. [name] was brought to [facility] where code stroke was called. Patient was given tPA and transferred to [facility] hospital. MRI revealed multiple areas of acute infarct in the cerebral hemispheres bilaterally consistent with cerebral emboli. Largest infarct in the left posterior frontal cortex.    Subjective Data  Patient Stated Goal: Family goals is for pt to go to inpatient rehab; pt did not set due to limited vocalization   Cognition  Cognition Arousal/Alertness: Awake/alert Behavior During Therapy: WFL for tasks assessed/performed Overall Cognitive Status: Within Functional Limits for tasks assessed    Balance  Static Standing Balance Static Standing - Balance Support: No upper extremity supported Static Standing - Level of Assistance: 4: Min assist Static Standing -  Comment/# of Minutes: (A) for balance due to posterior sway Dynamic Standing Balance Dynamic Standing - Balance Support: Left upper extremity supported;Right upper extremity supported Dynamic Standing -  Level of Assistance: 4: Min assist Dynamic Standing - Balance Activities: Lateral lean/weight shifting;Forward lean/weight shifting;Reaching across midline  End of Session PT - End of Session Equipment Utilized During Treatment: Gait belt Activity Tolerance: Patient tolerated treatment well Patient left: in chair;with call bell/phone within reach;with family/visitor present Nurse Communication: Mobility status   GP     Lara Mulch 05/23/2013, 1:16 PM  Verdell Face, PTA [phone] 05/23/2013

## 2013-05-23 NOTE — Discharge Summary (Signed)
Stroke Discharge Summary  Patient ID: Melanie Wood   MRN: [id number]      DOB: November 16, 1929  Date of Admission: 05/17/2013 Date of Discharge: 05/23/2013  Attending Physician:  Darcella Cheshire, MD, Stroke MD  Consulting Physician(s):    rehabilitation medicine  Patient's PCP:  No primary provider on file.  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  Past Medical History  Diagnosis Date  . Dementia   . A-fib   . Hypertension    Past Surgical History  Procedure Laterality Date  . Skin graft    . Skin surgery      Medications to be continued on Rehab . antiseptic oral rinse  15 mL Mouth Rinse BID  . atorvastatin  40 mg Oral q1800  . insulin aspart  0-9 Units Subcutaneous TID WC  . mometasone-formoterol  2 puff Inhalation BID  . pantoprazole sodium  40 mg Oral QHS  . rivaroxaban  15 mg Oral Q supper  . tiotropium  18 mcg Inhalation Daily    LABORATORY STUDIES CBC    Component Value Date/Time   WBC 6.6 05/22/2013 0805   RBC 3.74* 05/22/2013 0805   HGB 11.0* 05/22/2013 0805   HCT 34.9* 05/22/2013 0805   PLT 220 05/22/2013 0805   MCV 93.3 05/22/2013 0805   MCH 29.4 05/22/2013 0805   MCHC 31.5 05/22/2013 0805   RDW 14.9 05/22/2013 0805   LYMPHSABS 1.3 05/20/2013 0702   MONOABS 0.8 05/20/2013 0702   EOSABS 0.1 05/20/2013 0702   BASOSABS 0.0 05/20/2013 0702   CMP    Component Value Date/Time   NA 139 05/20/2013 0702   K 3.4* 05/20/2013 0702   CL 104 05/20/2013 0702   CO2 27 05/20/2013 0702   GLUCOSE 109* 05/20/2013 0702   BUN 8 05/20/2013  0702   CREATININE 0.37* 05/20/2013 0702   CALCIUM 8.0* 05/20/2013 0702   PROT 5.6* 05/20/2013 0702   ALBUMIN 2.6* 05/20/2013 0702   AST 20 05/20/2013 0702   ALT 12 05/20/2013 0702   ALKPHOS 53 05/20/2013 0702   BILITOT 0.6 05/20/2013 0702   GFRNONAA >90 05/20/2013 0702   GFRAA >90 05/20/2013 0702   COAGS No results found for this basename: INR,  PROTIME   Lipid Panel    Component Value Date/Time   CHOL 216* 05/18/2013 0430   TRIG 155* 05/18/2013 0430   HDL 71 05/18/2013 0430   CHOLHDL 3.0 05/18/2013 0430   VLDL 31 05/18/2013 0430   LDLCALC 114* 05/18/2013 0430   HgbA1C  Lab Results  Component Value Date   HGBA1C 6.2* 05/18/2013   Cardiac Panel (last 3 results) No results found for this basename: CKTOTAL, CKMB, TROPONINI, RELINDX,  in the last 72 hours Urinalysis No results found for this basename: colorurine,  appearanceur,  labspec,  phurine,  glucoseu,  hgbur,  bilirubinur,  ketonesur,  proteinur,  urobilinogen,  nitrite,  leukocytesur   Urine Drug Screen  No results found for this basename: labopia,  cocainscrnur,  labbenz,  amphetmu,  thcu,  labbarb    Alcohol Level No results found for this basename:  eth     SIGNIFICANT DIAGNOSTIC STUDIES  MRI of the brain  9/20 MRI HEAD IMPRESSION  Multiple areas of acute infarct in the cerebral hemispheres  bilaterally consistent with cerebral emboli. Largest infarct in the  left posterior frontal cortex. Generalized atrophy and chronic  microvascular ischemic changes in the white matter.  MRA of the brain  9/20 MRA HEAD IMPRESSION  Negative intracranial MRA  2D Echocardiogram EF 55%, wall Motion normal, left atrium mildly dilated. No ASD or PFO identified.  Carotid Doppler: Preliminary report: Bilateral: 1-39% ICA stenosis. Vertebral artery flow is antegrade      History of Present Illness   Melanie Wood is an 79 y.o. female with hx of A Fib who recently was taken off her Xarelto for 4 days in order to have a melanoma removed.  Today at 1115 she was noted to have right facial droop, slurred speech and right arm drift. Patient was brought to [facility] where code stroke was called. Patient was given tPA at 1430 on 9/19 and transferred to [facility] hospital. On arrival patient was brought to 3100. Exam showed improved strength but patient remained dysarthric with expressive difficulties. She does have a wound vac on her left leg with a moderate amount of blood--general surgery was consulted to see patient and make recommendations. She was admitted to the neuro ICU for further evaluation and treatment.  The patient's hospitalist apparently called the patient's neurologists, Dr. Sherryll Burger, and discussed tPA and it was decided to give tPA per family.    Hospital Course   [name] is a 79 y.o. female presenting with right facial droop, slurred speech and right arm drift. Status post IV t-PA. Imaging shows acute infarcts in the cerebral hemispheres bilaterally consistent with cerebral emboli.. Infarct felt to be embolic (patient hx of afib) secondary to holding Xarelto for surgical procedure. On Xarelto prior to admission. Restarted Xarelto 9/20 after MRI shows no hemorrhage. Patient with resultant dysarthria and marked expressive aphasia.  Ischemic infarct likely 2/2 embolic event from A fib/discontinuation of Xarelto  S/p melanoma excision with wound vac in place  Hyperlipidemia, LDL 114, statin started  A fib  HbA1c of 6.2  PLAN  Continue xarelto.  Risk factor modification  To CIR today  Patient to follow up with Dr. Pearlean Brownie in 2 months in stroke clinic.  Patient with vascular risk factors of:   Hyperlipidemia  Stroke  afib  Dementia  Patient has resultant dysarthria and marked expressive aphasia. Physical therapy, occupational therapy and speech therapy evaluated patient. All agreed inpatient rehab is needed. Patient's family is/are supportive and can provide care at discharge. CIR bed is available today and  patient will be transferred there.  Discharge Exam  Blood pressure 142/89, pulse 65, temperature 97.9 F (36.6 C), temperature source Oral, resp. rate 20, height 5\' 2"  (1.575 m), weight 82.2 kg (181 lb 3.5 oz), SpO2 97.00%.   Physical Exam  Gen:NAD  HEENT- Normocephalic, no lesions, without obvious abnormality. Normal external eye and conjunctiva. Normal TM's bilaterally. Normal auditory canals and external ears. Normal external nose, mucus membranes and septum. Normal pharynx.  Neck supple with no masses, nodes, nodules or enlargement.  Cardiovascular - regular rate and rhythm, S1, S2 normal, no murmur, click, rub or gallop  Lungs - chest clear, no wheezing, rales, normal symmetric air entry, Heart exam - S1, S2 normal, no murmur, no gallop, rate regular  Abdomen - soft, non-tender; bowel sounds normal; no masses, no organomegaly  Extremities - no  edema  Neurologic Examination:  Mental Status:  Alert, markedly dysarthric with expressive aphasia. Able to follow 3 step commands without difficulty.  Cranial Nerves:  II: Discs flat bilaterally; Visual fields grossly normal, pupils equal, round, reactive to light and accommodation  III,IV, VI: ptosis not present, extra-ocular motions intact bilaterally  V,VII: right facial droop, facial light touch sensation normal bilaterally  VIII: hearing normal bilaterally  IX,X: gag reflex present  XI: bilateral shoulder shrug  XII: midline tongue extension  Motor:  Right : Upper extremity 5/5 Left: Upper extremity 5/5  Lower extremity 5/5 Lower extremity 5/5  Tone and bulk:normal tone throughout; no atrophy noted  Sensory: Pinprick and light touch intact throughout, bilaterally  Deep Tendon Reflexes:  Depressed throughout  Plantars:  Mute bilaterally  Cerebellar:  normal finger-to-nose, normal heel-to-shin test  Gait: not tested   Discharge Diet  Dysphagia 3 liquids  Discharge Plan  Disposition:  Transfer to [facility] for  ongoing PT, OT and ST  xarelto 15mg  at supper daily for secondary stroke prevention.  Ongoing risk factor control by Primary Care Physician. Risk factor recommendations:  Hypertension target range 130-140/70-80 Lipid range - LDL < 100 and checked every 6 months, fasting Diabetes - HgB A1C <7   Follow-up with primary provider in 1 month.  Follow-up with Dr. Delia Heady or patients primary Neurologist within 2 months for continued stroke follow up.  55 minutes were spent preparing discharge.  Signed Gwendolyn Lima. Manson Passey, [facility], MBA, MHA [facility] Pager: [phone] 05/23/2013 1:56 PM  I have personally examined this patient, reviewed pertinent data and developed the plan of care. I agree with above.  Delia Heady, MD

## 2013-05-23 NOTE — Progress Notes (Signed)
Patient received at 1530 via wheelchair alert and oriented x 4 . Patient noted with garbled speech . Dressing to left groin intact JP drain intact draining cloudy red colored drainage. Moves all extremities . Right side weaker than left . Oriented patient to room and call bell system . Patient and family verbalized understanding of admission process . Continue with plan of care .                                      Cleotilde Neer

## 2013-05-23 NOTE — Progress Notes (Signed)
Report given to Angie in Rehab. All belongings sent with patient and daughters. She appears in stable condition. Denied any needs at this time.   Sim Boast, Rn 05/23/13 1525

## 2013-05-24 ENCOUNTER — Inpatient Hospital Stay (HOSPITAL_COMMUNITY): Payer: Medicare Other | Admitting: Occupational Therapy

## 2013-05-24 ENCOUNTER — Inpatient Hospital Stay (HOSPITAL_COMMUNITY): Payer: Medicare Other

## 2013-05-24 ENCOUNTER — Inpatient Hospital Stay (HOSPITAL_COMMUNITY): Payer: Medicare Other | Admitting: *Deleted

## 2013-05-24 DIAGNOSIS — I6340 Cerebral infarction due to embolism of unspecified cerebral artery: Secondary | ICD-10-CM

## 2013-05-24 DIAGNOSIS — G8110 Spastic hemiplegia affecting unspecified side: Secondary | ICD-10-CM

## 2013-05-24 DIAGNOSIS — I69920 Aphasia following unspecified cerebrovascular disease: Secondary | ICD-10-CM

## 2013-05-24 DIAGNOSIS — C439 Malignant melanoma of skin, unspecified: Secondary | ICD-10-CM | POA: Diagnosis present

## 2013-05-24 LAB — CBC WITH DIFFERENTIAL/PLATELET
Eosinophils Relative: 2 % (ref 0–5)
HCT: 34 % — ABNORMAL LOW (ref 36.0–46.0)
Hemoglobin: 10.9 g/dL — ABNORMAL LOW (ref 12.0–15.0)
Lymphocytes Relative: 16 % (ref 12–46)
Lymphs Abs: 1.2 10*3/uL (ref 0.7–4.0)
MCHC: 32.1 g/dL (ref 30.0–36.0)
MCV: 92.9 fL (ref 78.0–100.0)
Monocytes Absolute: 0.5 10*3/uL (ref 0.1–1.0)
Monocytes Relative: 6 % (ref 3–12)
Neutro Abs: 5.8 10*3/uL (ref 1.7–7.7)
Neutrophils Relative %: 76 % (ref 43–77)
RBC: 3.66 MIL/uL — ABNORMAL LOW (ref 3.87–5.11)
WBC: 7.6 10*3/uL (ref 4.0–10.5)

## 2013-05-24 LAB — COMPREHENSIVE METABOLIC PANEL
Albumin: 2.7 g/dL — ABNORMAL LOW (ref 3.5–5.2)
Alkaline Phosphatase: 65 U/L (ref 39–117)
BUN: 5 mg/dL — ABNORMAL LOW (ref 6–23)
CO2: 26 mEq/L (ref 19–32)
Chloride: 105 mEq/L (ref 96–112)
Creatinine, Ser: 0.41 mg/dL — ABNORMAL LOW (ref 0.50–1.10)
GFR calc non Af Amer: >90 mL/min (ref >90)
Glucose, Bld: 143 mg/dL — ABNORMAL HIGH (ref 70–99)
Potassium: 3.6 mEq/L (ref 3.5–5.1)
Total Bilirubin: 0.4 mg/dL (ref 0.3–1.2)

## 2013-05-24 LAB — URINE CULTURE

## 2013-05-24 LAB — GLUCOSE, CAPILLARY: Glucose-Capillary: 129 mg/dL — ABNORMAL HIGH (ref 70–99)

## 2013-05-24 MED ORDER — ENSURE COMPLETE PO LIQD
237.0000 mL | Freq: Two times a day (BID) | ORAL | Status: DC
  Administered 2013-05-24 – 2013-06-02 (×11): 237 mL via ORAL

## 2013-05-24 MED ORDER — INFLUENZA VAC SPLIT QUAD 0.5 ML IM SUSP
0.5000 mL | INTRAMUSCULAR | Status: AC
  Administered 2013-05-25: 0.5 mL via INTRAMUSCULAR
  Filled 2013-05-24: qty 0.5

## 2013-05-24 NOTE — Progress Notes (Signed)
Spoke with surgeon's office.  They recommend keeping JP in till drainage below 30cc. To cleanse around JP site with antibacterial agent and keep area clean and dry. To continue current care to donor site and shin wound.

## 2013-05-24 NOTE — Evaluation (Signed)
Occupational Therapy Assessment and Plan  Patient Details  Name: Melanie Wood MRN: [id number] Date of Birth: 1929-11-10  OT Diagnosis: acute pain, disturbance of vision and muscle weakness (generalized) Rehab Potential: Rehab Potential: Good ELOS: 1 week   Today's Date: 05/24/2013 Time: 0807-0908 Time Calculation (min): 61 min  Problem List:  Patient Active Problem List   Diagnosis Date Noted  . Right sided cerebral hemisphere cerebrovascular accident 05/23/2013  . Left sided cerebral hemisphere cerebrovascular accident 05/23/2013  . Cerebrovascular accident, embolic 05/23/2013  . Other and unspecified hyperlipidemia 05/23/2013  . Encounter for long-term (current) use of medications 05/23/2013  . CVA (cerebral infarction) 05/18/2013  . Aphasia 05/18/2013  . Atrial fibrillation 05/18/2013    Past Medical History:  Past Medical History  Diagnosis Date  . Dementia   . A-fib   . Hypertension    Past Surgical History:  Past Surgical History  Procedure Laterality Date  . Skin graft    . Skin surgery      Assessment & Plan Clinical Impression:   Melanie Wood is a 79 y.o. RH-female with history of A fib, HTN, who was taken off Xarelto to have melanoma removed. On 05/17/13, she was admitted with slurred speech with expressive difficulties, right facial droop and RUE weakness. MRI/MRA brain showed acute bilateral cerebral infarcts in bilateral frontal lobes (-largest in left posterior), Bilateral occipital and rigth thalamus; no stenosis. 2D echo with EF 55-60% and no wall abnormality. Neurology consulted and recommended resuming Xarelto for embolic stroke due to A Fib. CCS consulted for input on VAC and dressing changed yesterday. Patient continues with expressive deficits as well as reading deficits with suspicion of visual deficits, right sided weakness as well as problems with mobility. Therapies ongoing and CIR recommended by team.  Patient transferred to CIR on 05/23/2013 .     Patient currently requires   min with bathing and supervision with other basic self-care skills secondary to muscle weakness, decreased cardiorespiratoy endurance, decreased visual motor skills and decreased standing balance.  Prior to hospitalization, patient was independent in BADLs and living alone in an ALF apartment.  Patient will benefit from skilled intervention to increase independence with basic self-care skills prior to discharge home independently.  Anticipate patient will require intermittent supervision and no further OT follow recommended.  OT - End of Session Activity Tolerance: Tolerates 10 - 20 min activity with multiple rests Endurance Deficit: Yes Endurance Deficit Description: pt becomes fatigued easily OT Assessment Rehab Potential: Good OT Patient demonstrates impairments in the following area(s): Balance;Endurance;Pain OT Basic ADL's Functional Problem(s): Bathing;Dressing;Toileting OT Transfers Functional Problem(s): Toilet;Tub/Shower OT Additional Impairment(s): None OT Plan OT Intensity: Minimum of 1-2 x/day, 45 to 90 minutes OT Frequency: 5 out of 7 days OT Duration/Estimated Length of Stay: 1 week OT Treatment/Interventions: Balance/vestibular training;Discharge planning;DME/adaptive equipment instruction;Functional mobility training;Pain management;Patient/family education;Self Care/advanced ADL retraining;Therapeutic Activities;Therapeutic Exercise;UE/LE Strength taining/ROM OT Self Feeding Anticipated Outcome(s): I OT Basic Self-Care Anticipated Outcome(s): mod I dressing and sponge bath, supervision with shower transfer and shower (to cover wound) OT Toileting Anticipated Outcome(s): mod I OT Bathroom Transfers Anticipated Outcome(s): Mod I OT Recommendation Patient destination: Assisted Living Follow Up Recommendations: None   Skilled Therapeutic Intervention Pt seen for initial evaluation and ADL retraining of toileting, bathing at sink, dressing,  and functional mobility in the room.  Pt only requires supervision except for min assist to bathe feet. She uses AE for dressing and has been using it for many years.  She does get fatigued easily and  needs frequent rest breaks. Otherwise, during today's session she was able to stand up numerous times without assist and stood to complete most of her bathing without LOB. Pt's daughters are able to walk pt to the bathroom with RW.  This was indicated on pt's safety plan.  Pt demonstrates good problem solving and was able to express herself clearly 20% of the time.  Pt resting in chair at end of session with daughter in the room.  OT Evaluation Precautions/Restrictions  Precautions Precautions: Fall;Other (comment) Precaution Comments: JP drain left leg.  She no longer has the wound vag Restrictions Weight Bearing Restrictions: No    Vital Signs Therapy Vitals Pulse Rate: 77 BP: 148/93 mmHg Patient Position, if appropriate: Lying Oxygen Therapy SpO2: 95 % O2 Device: None (Room air) Pain Pain Assessment Pain Assessment: 0-10 Pain Score: 4  Pain Type: Acute pain;Surgical pain Pain Location: Leg Pain Orientation: Left Pain Descriptors / Indicators: Aching;Sore Pain Frequency: Occasional Pain Onset: Gradual Patients Stated Pain Goal: 2 Pain Intervention(s): Medication (See eMAR) (ultram 50 mgpo) Home Living/Prior Functioning Home Living Family/patient expects to be discharged to:: Assisted living Available Help at Discharge: Available 24 hours/day;Family Type of Home: Assisted living Home Layout: One level  Lives With: Alone;Other (Comment) Prior Function Level of Independence: Independent with basic ADLs;Requires assistive device for independence Driving: No Vocation: Retired Comments: Ambulates to dining hall at ALF with RW and no assistance ADL  Refer to [place] [facility]/Perception  Vision - History Baseline Vision: Wears glasses all the time Patient Visual Report: No change  from baseline Vision - Assessment Eye Alignment: Within Functional Limits Vision Assessment: Vision not tested (to be evaluated) Perception Perception: Within Functional Limits Praxis Praxis: Intact  Cognition Overall Cognitive Status: Within Functional Limits for tasks assessed Sensation Sensation Light Touch: Appears Intact Stereognosis: Appears Intact Hot/Cold: Appears Intact Proprioception: Appears Intact Coordination Gross Motor Movements are Fluid and Coordinated: Yes Fine Motor Movements are Fluid and Coordinated: Yes (during functional self care activities) Motor  Motor Motor - Skilled Clinical Observations: generalized muscle weakness Mobility    Refer to FIM Trunk/Postural Assessment  Cervical Assessment Cervical Assessment: Within Functional Limits Thoracic Assessment Thoracic Assessment: Within Functional Limits Lumbar Assessment Lumbar Assessment: Within Functional Limits Postural Control Postural Control: Within Functional Limits  Balance Static Sitting Balance Static Sitting - Level of Assistance: 7: Independent Static Standing Balance Static Standing - Level of Assistance: 5: Stand by assistance Dynamic Standing Balance Dynamic Standing - Level of Assistance: 5: Stand by assistance Extremity/Trunk Assessment RUE Assessment RUE Assessment: Within Functional Limits LUE Assessment LUE Assessment: Within Functional Limits  FIM:  FIM - Grooming Grooming Steps: Wash, rinse, dry face;Wash, rinse, dry hands;Oral care, brush teeth, clean dentures;Brush, comb hair Grooming: 5: Supervision: safety issues or verbal cues FIM - Bathing Bathing Steps Patient Completed: Chest;Right Arm;Left Arm;Abdomen;Front perineal area;Buttocks;Right upper leg;Left upper leg Bathing: 4: Min-Patient completes 8-9 72f 10 parts or 75+ percent FIM - Upper Body Dressing/Undressing Upper body dressing/undressing steps patient completed: Thread/unthread right bra strap;Thread/unthread  left bra strap;Thread/unthread right sleeve of pullover shirt/dresss;Thread/unthread left sleeve of pullover shirt/dress;Put head through opening of pull over shirt/dress;Pull shirt over trunk;Hook/unhook bra Upper body dressing/undressing: 5: Set-up assist to: Obtain clothing/put away FIM - Lower Body Dressing/Undressing Lower body dressing/undressing steps patient completed: Thread/unthread right underwear leg;Thread/unthread left underwear leg;Pull underwear up/down;Thread/unthread right pants leg;Thread/unthread left pants leg;Pull pants up/down;Don/Doff right sock;Don/Doff left sock;Don/Doff right shoe;Don/Doff left shoe (uses shoe horn, sock aid, and elastic laces PTA) FIM - Toileting Toileting  steps completed by patient: Adjust clothing prior to toileting;Performs perineal hygiene;Adjust clothing after toileting Toileting: 5: Supervision: Safety issues/verbal cues FIM - Archivist Transfers Assistive Devices: Walker;Grab bars Toilet Transfers: 5-To toilet/BSC: Supervision (verbal cues/safety issues);5-From toilet/BSC: Supervision (verbal cues/safety issues) FIM - Tub/Shower Transfers Tub/shower Transfers: 0-Activity did not occur or was simulated   Refer to Care Plan for Long Term Goals  Recommendations for other services: None  Discharge Criteria: Patient will be discharged from OT if patient refuses treatment 3 consecutive times without medical reason, if treatment goals not met, if there is a change in medical status, if patient makes no progress towards goals or if patient is discharged from hospital.  The above assessment, treatment plan, treatment alternatives and goals were discussed and mutually agreed upon: by patient and by family  Renda Pohlman 05/24/2013, 9:34 AM

## 2013-05-24 NOTE — Evaluation (Signed)
Physical Therapy Assessment and Plan  Patient Details  Name: Melanie Wood MRN: [id number] Date of Birth: [birth year]  PT Diagnosis: Abnormal posture, Abnormality of gait, Ataxia, Coordination disorder, Impaired sensation and Muscle weakness Rehab Potential: Good ELOS: 7-10 days   Today's Date: 05/24/2013 Time: 1100-1145 Time Calculation (min): 45 min - Missed skilled PT due to fatigue  Problem List:  Patient Active Problem List   Diagnosis Date Noted  . Melanoma left shin--s/p excision with skin graft 05/24/2013  . CVA (cerebral vascular accident)-bicerebral/bicerebellar embolic infarcts--largest left posterior frontal 05/23/2013  . Left sided cerebral hemisphere cerebrovascular accident 05/23/2013  . Cerebrovascular accident, embolic 05/23/2013  . Other and unspecified hyperlipidemia 05/23/2013  . Encounter for long-term (current) use of medications 05/23/2013  . CVA (cerebral infarction) 05/18/2013  . Aphasia 05/18/2013  . Atrial fibrillation 05/18/2013    Past Medical History:  Past Medical History  Diagnosis Date  . Dementia   . A-fib   . Hypertension    Past Surgical History:  Past Surgical History  Procedure Laterality Date  . Skin graft    . Skin surgery      Assessment & Plan Clinical Impression: Melanie Wood is a 79 y.o. RH-female with history of A fib, HTN, who was taken off Xarelto to have melanoma removed. On 05/17/13, she was admitted with slurred speech with expressive difficulties, right facial droop and RUE weakness. MRI/MRA brain showed acute bilateral cerebral infarcts in bilateral frontal lobes (-largest in left posterior), Bilateral occipital and rigth thalamus; no stenosis. 2D echo with EF 55-60% and no wall abnormality. Neurology consulted and recommended resuming Xarelto for embolic stroke due to A Fib. CCS consulted for input on VAC and dressing changed yesterday. Patient continues with expressive deficits as well as reading deficits with  suspicion of visual deficits, right sided weakness as well as problems with mobility. Therapies ongoing and CIR recommended by team.  Patient transferred to CIR on 05/23/2013 .   Patient currently requires min with mobility secondary to muscle weakness, impaired timing and sequencing, unbalanced muscle activation, ataxia and decreased coordination, decreased visual motor skills and decreased sitting balance, decreased standing balance and decreased balance strategies.  Prior to hospitalization, patient was modified independent  with mobility and lived with Alone in a Assisted living (spouse lives in memory care, same facility) home.  Home access is  Level entry.  Patient will benefit from skilled PT intervention to maximize safe functional mobility, minimize fall risk and decrease caregiver burden for planned discharge home with 24 hour supervision.  Anticipate patient will benefit from follow up HH at discharge.  PT - End of Session Activity Tolerance: Tolerates 10 - 20 min activity with multiple rests Endurance Deficit: Yes Endurance Deficit Description: Pt very fatigued at eval from busy morning, needing to end tx early and fell asleep before therapist left the room PT Assessment Rehab Potential: Good PT Patient demonstrates impairments in the following area(s): Balance;Endurance;Motor;Pain;Sensory PT Transfers Functional Problem(s): Bed Mobility;Bed to Chair;Car;Furniture PT Locomotion Functional Problem(s): Ambulation;Wheelchair Mobility PT Plan PT Intensity: Minimum of 1-2 x/day ,45 to 90 minutes PT Frequency: 5 out of 7 days PT Duration Estimated Length of Stay: 7-10 days PT Treatment/Interventions: Ambulation/gait training;Balance/vestibular training;Discharge planning;Disease management/prevention;DME/adaptive equipment instruction;Functional mobility training;Neuromuscular re-education;Pain management;Patient/family education;Psychosocial support;Skin care/wound management;Therapeutic  Activities;Therapeutic Exercise;UE/LE Strength taining/ROM;UE/LE Coordination activities;Wheelchair propulsion/positioning;Visual/perceptual remediation/compensation PT Transfers Anticipated Outcome(s): Mod I  PT Locomotion Anticipated Outcome(s): Mod I  PT Recommendation Follow Up Recommendations: Home health PT;24 hour supervision/assistance Patient destination: Assisted Living Equipment Recommended: Wheelchair (  measurements);Wheelchair cushion (measurements) Equipment Details: Pt likely to need WC for community mobility  Skilled Therapeutic Intervention Unable to initiate tx at eval due to fatigue   PT Evaluation Precautions/Restrictions Precautions Precautions: Fall;Other (comment) (Wound on L lateral thigh at graft, L calf melanoma removal) Precaution Comments: JP drain L groin, lymph nodes removed Restrictions Weight Bearing Restrictions: No General Amount of Missed PT Time (min): 15 Minutes Missed Time Reason: Patient fatigue Vital SignsOxygen Therapy SpO2: 95 % O2 Device: None (Room air) Pain Pain Assessment Pain Assessment: No/denies pain Home Living/Prior Functioning Home Living Available Help at Discharge: Available 24 hours/day;Family Type of Home: Assisted living (spouse lives in memory care, same facility) Home Access: Level entry Home Layout: One level  Lives With: Alone Prior Function Level of Independence: Requires assistive device for independence  Able to Take Stairs?: No Driving: No 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[employer], television/film set ) Comments: Ambulates to dining hall and to memory care unit at ALF with RW and no assistance Vision/Perception  Vision - History Baseline Vision: Wears glasses only for reading Patient Visual Report: No change from baseline Vision - Assessment Eye Alignment: Within Functional Limits Vision Assessment: Vision not tested Ocular Range of Motion: Within Functional Limits Tracking/Visual Pursuits: Impaired - to be further  tested in functional context;Unable to hold eye position out of midline Perception Perception: Within Functional Limits Praxis Praxis: Intact  Cognition Overall Cognitive Status: Impaired/Different from baseline Arousal/Alertness: Awake/alert Orientation Level: Oriented X4 Attention: Alternating Sustained Attention: Impaired Sustained Attention Impairment: Verbal complex;Verbal basic;Functional basic Alternating Attention: Impaired Alternating Attention Impairment: Functional basic Memory: Impaired Memory Impairment: Decreased recall of new information Awareness: Appears intact Problem Solving: Appears intact Behaviors: Poor frustration tolerance Safety/Judgment: Appears intact Sensation Sensation Light Touch: Impaired by gross assessment Stereognosis: Not tested Hot/Cold: Not tested Proprioception: Appears Intact Additional Comments: Decreased light touch L foot Coordination Gross Motor Movements are Fluid and Coordinated: Yes Fine Motor Movements are Fluid and Coordinated: No Finger Nose Finger Test: Mild dysmetria L f Heel Shin Test: Decreased accuracy, excursion, and rhythm Motor  Motor Motor: Ataxia Motor - Skilled Clinical Observations: generalized weakness and decreased coordination with rapid alternating movements  Mobility Bed Mobility Bed Mobility: Sit to Supine Sit to Supine: 4: Min assist;HOB flat Sit to Supine - Details (indicate cue type and reason): Min A for LE lifting Transfers Transfers: Yes Sit to Stand: 4: Min assist;With upper extremity assist;With armrests Sit to Stand Details (indicate cue type and reason): Lifting assist Stand to Sit: 4: Min guard;With upper extremity assist;With armrests Stand Pivot Transfers: 4: Min assist;With armrests Stand Pivot Transfer Details (indicate cue type and reason): Min A to steady while turning Locomotion  Ambulation Ambulation: Yes Ambulation/Gait Assistance: 4: Min assist Ambulation Distance (Feet): 80  Feet Assistive device: Rolling walker Ambulation/Gait Assistance Details: Steadying assist. pt with bil IR during gait with shuffling noted Stairs / Additional Locomotion Stairs: No Wheelchair Mobility Wheelchair Mobility: Yes Wheelchair Assistance: 4: Min Education officer, museum: Both upper extremities Wheelchair Parts Management: Needs assistance Distance: 40  Trunk/Postural Assessment  Cervical Assessment Cervical Assessment: Exceptions to [facility] Cervical Strength Overall Cervical Strength Comments: Pt c/o neck pain at end of tx due to fatigue from unsupported sitting during eval Thoracic Assessment Thoracic Assessment: Within Functional Limits Lumbar Assessment Lumbar Assessment: Within Functional Limits Postural Control Postural Control: Deficits on evaluation Righting Reactions: Delayed Postural Limitations: Decreased postural endurance, fatigue from unsuppported sitting  Balance Balance Balance Assessed: Yes Static Sitting Balance Static Sitting - Balance Support: Bilateral upper  extremity supported;Feet supported Static Sitting - Level of Assistance: 5: Stand by assistance Static Sitting - Comment/# of Minutes: Pt initially with posteriore lean, needing bil UE to support Dynamic Sitting Balance Dynamic Sitting - Balance Support: Right upper extremity supported;Feet supported Dynamic Sitting - Level of Assistance: 5: Stand by assistance Dynamic Sitting - Balance Activities: Lateral lean/weight shifting;Forward lean/weight shifting;Reaching for objects Static Standing Balance Static Standing - Balance Support: Right upper extremity supported;During functional activity Static Standing - Level of Assistance: 4: Min assist Static Standing - Comment/# of Minutes: Pt needing 1 UE supported for static stance, needing to sit within 30 seconds Dynamic Standing Balance Dynamic Standing - Balance Support: Left upper extremity supported;Right upper extremity supported Dynamic  Standing - Level of Assistance: 4: Min assist Dynamic Standing - Balance Activities: Lateral lean/weight shifting;Forward lean/weight shifting;Reaching for objects;Reaching for weighted objects Dynamic Standing - Comments: reachign for objects, pt with decreased ability to reach outside BOS Extremity Assessment  RUE Assessment RUE Assessment: Within Functional Limits LUE Assessment LUE Assessment: Within Functional Limits RLE Assessment RLE Assessment: Within Functional Limits LLE Assessment LLE Assessment: Exceptions to [facility] LLE Strength LLE Overall Strength Comments: MMT limited by surgical sites, but overall 4/5 throughout   FIM:  FIM - Bed/Chair Transfer Bed/Chair Transfer: 4: Supine > Sit: Min A (steadying Pt. > 75%/lift 1 leg);3: Sit > Supine: Mod A (lifting assist/Pt. 50-74%/lift 2 legs);4: Bed > Chair or W/C: Min A (steadying Pt. > 75%);4: Chair or W/C > Bed: Min A (steadying Pt. > 75%) FIM - Locomotion: Wheelchair Distance: 40 FIM - Locomotion: Ambulation Ambulation/Gait Assistance: 4: Min assist   Refer to Care Plan for Long Term Goals  Recommendations for other services: None  Discharge Criteria: Patient will be discharged from PT if patient refuses treatment 3 consecutive times without medical reason, if treatment goals not met, if there is a change in medical status, if patient makes no progress towards goals or if patient is discharged from hospital.  The above assessment, treatment plan, treatment alternatives and goals were discussed and mutually agreed upon: by patient and by family  Clydene Laming, PT, DPT   05/24/2013, 12:31 PM

## 2013-05-24 NOTE — Evaluation (Signed)
Speech Language Pathology Assessment and Plan  Patient Details  Name: Melanie Wood MRN: [id number] Date of Birth: [birth year]  SLP Diagnosis: Aphasia;Dysarthria;Cognitive Impairments;Dysphagia  Rehab Potential: Excellent ELOS: 7-10 days   Today's Date: 05/24/2013 Time: 7829-5621 Time Calculation (min): 60 min  Skilled Therapeutic Intervention: Administered cognitive-linguistic evaluation and BSE. Please see below for details. Pt/family education initiated with the patient and her daughter, Lafonda Mosses.  Problem List:  Patient Active Problem List   Diagnosis Date Noted  . Melanoma left shin--s/p excision with skin graft 05/24/2013  . CVA (cerebral vascular accident)-bicerebral/bicerebellar embolic infarcts--largest left posterior frontal 05/23/2013  . Left sided cerebral hemisphere cerebrovascular accident 05/23/2013  . Cerebrovascular accident, embolic 05/23/2013  . Other and unspecified hyperlipidemia 05/23/2013  . Encounter for long-term (current) use of medications 05/23/2013  . CVA (cerebral infarction) 05/18/2013  . Aphasia 05/18/2013  . Atrial fibrillation 05/18/2013   Past Medical History:  Past Medical History  Diagnosis Date  . Dementia   . A-fib   . Hypertension    Past Surgical History:  Past Surgical History  Procedure Laterality Date  . Skin graft    . Skin surgery      Assessment / Plan / Recommendation Clinical Impression  Pt is an 80 y.o. RH-female with history of A fib, HTN, who was taken off Xarelto to have melanoma removed. On 05/17/13, she was admitted with slurred speech with expressive difficulties, right facial droop and RUE weakness. MRI/MRA brain showed acute bilateral cerebral infarcts in bilateral frontal lobes (-largest in left posterior), Bilateral occipital and rigth thalamus; no stenosis. 2D echo with EF 55-60% and no wall abnormality. Neurology consulted and recommended resuming Xarelto for embolic stroke due to A Fib. CCS consulted for input on  VAC and dressing changed yesterday. Patient continues with expressive deficits as well as reading deficits with suspicion of visual deficits, right sided weakness as well as problems with mobility. Therapies ongoing and CIR recommended by team. Pt was admitted 05/23/13 with bedside swallow and cognitive-linguistic evaluations completed 05/24/13. Pt appears to be tolerating Dys. 3 textures and thin liquids, with brief treatment recommended for advancement of solid textures. Pt's receptive language appears within gross functional limits, with greater impairments noted with expressive language. Pt is spontaneously communicating at the word-sentence level, with phonemic paraphasias. With extra time, pt can often initiate word retrieval, however her frustration tolerance is low. Given her strengths in receptive language, communication can be facilitated with yes/no questions and communication boards. Cognitively she presents with decreased recall of new information and difficulty with alternating attention, however this may be skewed by her expressive language. Will continue to assess, however given that at baseline pt was not responsible for any home management (cooking, cleaning, bills, errands, medications, etc.), recommend that treatment focus primarily on communication skills. Pt will benefit from skilled SLP services to address the areas above in order to maximize her functional communication.    SLP Assessment  Patient will need skilled Speech Lanaguage Pathology Services during CIR admission    Recommendations  Diet Recommendations: Dysphagia 3 (Mechanical Soft);Thin liquid Liquid Administration via: Cup;Straw;No straw Medication Administration: Whole meds with liquid Supervision: Patient able to self feed;Intermittent supervision to cue for compensatory strategies Compensations: Slow rate;Small sips/bites;Check for pocketing;Multiple dry swallows after each bite/sip;Clear throat intermittently Postural  Changes and/or Swallow Maneuvers: Seated upright 90 degrees Oral Care Recommendations: Oral care before and after PO Patient destination: Assisted Living Follow up Recommendations: Home Health SLP;24 hour supervision/assistance Equipment Recommended: None recommended by SLP  SLP Frequency 5 out of 7 days   SLP Treatment/Interventions Cognitive remediation/compensation;Cueing hierarchy;Dysphagia/aspiration precaution training;Environmental controls;Functional tasks;Internal/external aids;Multimodal communication approach;Patient/family education;Speech/Language facilitation    Pain Pain Assessment Pain Assessment: No/denies pain Prior Functioning Cognitive/Linguistic Baseline: Within functional limits (per pt/family report, however living in ALF) Type of Home: Assisted living (spouse lives in memory care, same facility)  Lives With: Alone Available Help at Discharge: Available 24 hours/day;Family 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 SLP Short Term Goal 2 (Week 1): Pt will utilize external memory aids with Mod cues. SLP Short Term Goal 3 (Week 1): Pt will consume dys. 3 textures and thin liquids with minimal overt s/s of aspiration. SLP Short Term Goal 4 (Week 1): Pt will demonstrate efficient mastication with trials of regular textures.  See FIM for current functional status Refer to Care Plan for Long Term Goals  Recommendations for other services: None  Discharge Criteria: Patient will be discharged from SLP if patient refuses treatment 3 consecutive times without medical reason, if treatment goals not met, if there is a change in medical status, if patient makes no progress towards goals or if patient is discharged from hospital.  The above assessment, treatment plan, treatment alternatives and goals were discussed and  mutually agreed upon: by patient and by family  Maxcine Ham 05/24/2013, 12:05 PM

## 2013-05-24 NOTE — Progress Notes (Signed)
Subjective/Complaints: Melanie Wood is a 79 y.o. RH-female with history of A fib, HTN, who was taken off Xarelto to have melanoma removed. On 05/17/13, she was admitted with slurred speech with expressive difficulties, right facial droop and RUE weakness. MRI/MRA brain showed acute bilateral cerebral infarcts in bilateral frontal lobes (-largest in left posterior), Bilateral occipital and rigth thalamus; no stenosis. 2D echo with EF 55-60% and no wall abnormality. Neurology consulted and recommended resuming Xarelto for embolic stroke due to A Fib. CCS consulted for input on VAC and dressing changed yesterday.  Review of Systems - unable to obtain secondary to aphasia Objective: Vital Signs: Blood pressure 159/104, pulse 92, temperature 98.1 F (36.7 C), temperature source Oral, resp. rate 19, weight 81.239 kg (179 lb 1.6 oz), SpO2 96.00%. No results found. Results for orders placed during the hospital encounter of 05/23/13 (from the past 72 hour(s))  GLUCOSE, CAPILLARY     Status: Abnormal   Collection Time    05/23/13  4:28 PM      Result Value Range   Glucose-Capillary 111 (*) 70 - 99 mg/dL   Comment 1 Notify RN    GLUCOSE, CAPILLARY     Status: Abnormal   Collection Time    05/23/13  9:01 PM      Result Value Range   Glucose-Capillary 145 (*) 70 - 99 mg/dL   Comment 1 Notify RN       HEENT: normal Cardio: irregular Resp: Wheezes and unlabored GI: BS positive and non distended Extremity:  No Edema Skin:   Wound Left lateral thigh STSG donor site and Other Left lateral calf full thickness excision Neuro: Cranial Nerve II-XII normal, Abnormal Sensory cannot assess secondary to aphasia, Normal Motor, Abnormal FMC Ataxic/ dec FMC, Dysarthric and Aphasic Musc/Skel:  Normal Gen NAD   Assessment/Plan: 1. Functional deficits secondary to Left posterior frontal embolic infarct which require 3+ hours per day of interdisciplinary therapy in a comprehensive inpatient rehab  setting. Physiatrist is providing close team supervision and 24 hour management of active medical problems listed below. Physiatrist and rehab team continue to assess barriers to discharge/monitor patient progress toward functional and medical goals. FIM:       FIM - Toileting Toileting steps completed by patient: Performs perineal hygiene;Adjust clothing after toileting Toileting Assistive Devices: Grab bar or rail for support Toileting: 2: Max-Patient completed 1 of 3 steps  FIM - Archivist Transfers: 4-To toilet/BSC: Min A (steadying Pt. > 75%);4-From toilet/BSC: Min A (steadying Pt. > 75%)  FIM - Bed/Chair Transfer Bed/Chair Transfer: 3: Supine > Sit: Mod A (lifting assist/Pt. 50-74%/lift 2 legs;4: Sit > Supine: Min A (steadying pt. > 75%/lift 1 leg)     Comprehension Comprehension Mode: Auditory Comprehension: 5-Follows basic conversation/direction: With extra time/assistive device  Expression Expression Mode: Verbal Expression: 3-Expresses basic 50 - 74% of the time/requires cueing 25 - 50% of the time. Needs to repeat parts of sentences.  Social Interaction Social Interaction: 5-Interacts appropriately 90% of the time - Needs monitoring or encouragement for participation or interaction.  Problem Solving Problem Solving: 4-Solves basic 75 - 89% of the time/requires cueing 10 - 24% of the time  Memory Memory: 4-Recognizes or recalls 75 - 89% of the time/requires cueing 10 - 24% of the time   Medical Problem List and Plan:  1. DVT Prophylaxis/Anticoagulation: Pharmaceutical: Xarelto  2. Pain Management: Will add prn ultram.  3. Mood: LCSW to follow for evaluation.  4. Neuropsych: This patient is capable of making decisions  on her own behalf.  5. HTN: Monitor with bid checks. Blood pressures labile---resume lisinopril  6. Melanoma left lateral calf: S/P excision Daily dressing change to left calf--to be kept moist with change of topper daily.  7. A fib:  Monitor heart rate with bid checks as may increase with activity. Is off coreg at this time.  8. Hypokalemia: Recheck in am. Will supplement briefly--dilutional due to IVF. Discontinue IVF.  9. Frequency/urgency: Maybe related to IVF and CVA but will check UA/UCS 10.  Wheezing no SOB monitor during therapy, has inhalers LOS (Days) 1 A FACE TO FACE EVALUATION WAS PERFORMED  Kelle Ruppert E 05/24/2013, 6:01 AM

## 2013-05-24 NOTE — Progress Notes (Signed)
Occupational Therapy Session Note  Patient Details  Name: Melanie Wood MRN: [id number] Date of Birth: 11/01/1929  Today's Date: 05/24/2013 Time: 4696-2952 Time Calculation (min): 25 min  Short Term Goals: Week 1:  OT Short Term Goal 1 (Week 1): STG = LTGs due to short LOS  Skilled Therapeutic Interventions/Progress Updates:  Patient found seated in recliner with family present in room. Patient stood with RW and ambulated from room -> ADL apartment. Patient sat on couch with close supervision and min verbal cues for safety. Vision checked and patient able to track object with bilateral eyes without loosing focus. Patient's peripheral vision was within normal limits. PT stated patient had difficulty tracking earlier this am, this difficulty may have been from overall/general fatigue from a long morning of therapies. Patient with expressive aphasia and got frustrated easily. While in ADL apartment, patient engaged in stepping over blue simulated block to work on increasing independence with shower stall transfers. Patient ambulated back to room and therapist left patient seated in recliner with daughter present. Daughters are okay to assist patient <>bathroom.   Precautions:  Precautions Precautions: Fall;Other (comment) (Wound on L lateral thigh at graft, L calf melanoma removal) Precaution Comments: JP drain L groin, lymph nodes removed Restrictions Weight Bearing Restrictions: No  See FIM for current functional status  Therapy/Group: Individual Therapy  Verita Kuroda 05/24/2013, 3:19 PM

## 2013-05-24 NOTE — Progress Notes (Signed)
INITIAL NUTRITION ASSESSMENT  DOCUMENTATION CODES Per approved criteria  -Obesity Unspecified   INTERVENTION:  1. Educated pt and daughter about alternate menu choices.   2. Ensure Complete po BID, each supplement provides 350 kcal and 13 grams of protein.  NUTRITION DIAGNOSIS: Inadequate oral intake related to dislike of food as evidenced by Meal Completion: <50%.   Goal: Pt to meet >/= 90% of their estimated nutrition needs   Monitor:  PO intake, supplement acceptance, weight trend, labs   Reason for Assessment: MD Consult  79 y.o. female  Admitting Dx: CVA (cerebral vascular accident)  ASSESSMENT: Pt recently had a skin graft for melanoma while off her Xarelto pt had a stroke. Pt now admitted for rehab. Per pt no nutrition issues PTA. However, pt has been eating poorly due to not liking the food here. Pt is willing to try ensure.  Discussed importance of nutrition with pt and daughter.   Nutrition Focused Physical Exam:  Subcutaneous Fat:  Orbital Region: WNL Upper Arm Region: WNL Thoracic and Lumbar Region: WNL  Muscle:  Temple Region: WNL Clavicle Bone Region: WNL Clavicle and Acromion Bone Region: WNL Scapular Bone Region: WNL Dorsal Hand: WNL Patellar Region: WNL Anterior Thigh Region: WNL Posterior Calf Region: WNL  Edema: not present   Height: Ht Readings from Last 1 Encounters:  05/23/13 5\' 2"  (1.575 m)    Weight: Wt Readings from Last 1 Encounters:  05/23/13 179 lb 1.6 oz (81.239 kg)    Ideal Body Weight: 50 kg  % Ideal Body Weight: 162%  Wt Readings from Last 10 Encounters:  05/23/13 179 lb 1.6 oz (81.239 kg)  05/17/13 181 lb 3.5 oz (82.2 kg)    Usual Body Weight: 180 lb   % Usual Body Weight: 100%  BMI:  Body mass index is 32.75 kg/(m^2).  Estimated Nutritional Needs: Kcal: 1600-1800 Protein: 80-90 grams Fluid: >1.6 L/day  Skin: skin graft left leg, incision left leg from donor site  Diet Order: Dysphagia 3 with Thin  Liquids Meal Completion: <50% per pt   EDUCATION NEEDS: -No education needs identified at this time   Intake/Output Summary (Last 24 hours) at 05/24/13 1532 Last data filed at 05/24/13 0820  Gross per 24 hour  Intake    380 ml  Output      0 ml  Net    380 ml    Last BM: 9/24   Labs:   Recent Labs Lab 05/20/13 0702 05/24/13 0730  NA 139 142  K 3.4* 3.6  CL 104 105  CO2 27 26  BUN 8 5*  CREATININE 0.37* 0.41*  CALCIUM 8.0* 8.4  GLUCOSE 109* 143*    CBG (last 3)   Recent Labs  05/23/13 2101 05/24/13 0732 05/24/13 1151  GLUCAP 145* 138* 129*    Scheduled Meds: . antiseptic oral rinse  15 mL Mouth Rinse BID  . atorvastatin  40 mg Oral q1800  . guaiFENesin  600 mg Oral BID  . [START ON 05/25/2013] influenza vac split quadrivalent PF  0.5 mL Intramuscular Tomorrow-1000  . lisinopril  1.25 mg Oral Daily  . mometasone-formoterol  2 puff Inhalation BID  . pantoprazole  40 mg Oral QHS  . rivaroxaban  15 mg Oral Q supper  . tiotropium  18 mcg Inhalation Daily    Continuous Infusions:   Past Medical History  Diagnosis Date  . Dementia   . A-fib   . Hypertension     Past Surgical History  Procedure Laterality Date  .  Skin graft    . Skin surgery      Kendell Bane RD, LDN, CNSC [phone] Pager [phone] After Hours Pager

## 2013-05-24 NOTE — Progress Notes (Signed)
Social Work Assessment and Plan Social Work Assessment and Plan  Patient Details  Name: Melanie Wood MRN: [id number] Date of Birth: 11/10/1929  Today's Date: 05/24/2013  Problem List:  Patient Active Problem List   Diagnosis Date Noted  . Melanoma left shin--s/p excision with skin graft 05/24/2013  . CVA (cerebral vascular accident)-bicerebral/bicerebellar embolic infarcts--largest left posterior frontal 05/23/2013  . Left sided cerebral hemisphere cerebrovascular accident 05/23/2013  . Cerebrovascular accident, embolic 05/23/2013  . Other and unspecified hyperlipidemia 05/23/2013  . Encounter for long-term (current) use of medications 05/23/2013  . CVA (cerebral infarction) 05/18/2013  . Aphasia 05/18/2013  . Atrial fibrillation 05/18/2013   Past Medical History:  Past Medical History  Diagnosis Date  . Dementia   . A-fib   . Hypertension    Past Surgical History:  Past Surgical History  Procedure Laterality Date  . Skin graft    . Skin surgery     Social History:  reports that she quit smoking about 25 years ago. Her smoking use included Cigarettes. She smoked 0.00 packs per day. She does not have any smokeless tobacco history on file. Her alcohol and drug histories are not on file.  Family / Support Systems Marital Status: Married Patient Roles: Spouse;Parent Spouse/Significant Other: Ree Kida resident in [facility] Children: Mirna Mires Cauley-daughter  [phone] Other Supports: Kaleen Mask  [phone]   Lafonda Mosses Marcus-daughter  [phone]-cell Anticipated Caregiver: [facility] Ability/Limitations of Caregiver: No limitations Caregiver Availability: 24/7 Family Dynamics: Very close knit family pt and husband moved from [place] last year to be closer to daughter and go to ALF.  All daughter's are very involved and supportive.  Social History Preferred language: English Religion:  Cultural Background: No issues Education:  Retired Charity fundraiser Read: Yes Write: Yes Employment Status: Retired Fish farm manager Issues: No issues Guardian/Conservator: Teresa-POA pt is capable of mkaing her own decisions according to MD   Abuse/Neglect Physical Abuse: Denies Verbal Abuse: Denies Sexual Abuse: Denies Exploitation of patient/patient's resources: Denies Self-Neglect: Denies  Emotional Status Pt's affect, behavior adn adjustment status: Pt is motivated and tries to speak, which is difficult for her.  She has always been the caregiver not the person needing care.  She is motivated and has a good attitiude and plans on improving. Recent Psychosocial Issues: recent melanoma surgery, still recovering form this. Pyschiatric History: No history-deferred depression screen due to pt's language issues, but seems to be bright and motivated.  Daughter reports: " She is taking this in stride." Substance Abuse History: None  Patient / Family Perceptions, Expectations & Goals Pt/Family understanding of illness & functional limitations: Pt and daughter have a good understanding of her diagnoses and treamtent plan.  Daughter's are here daily to make sure pt has emotional support and someone close.   Premorbid pt/family roles/activities: Wife, Mother, grandmother, Retiree, Etc Anticipated changes in roles/activities/participation: resume Pt/family expectations/goals: Pt states: " Do well here."  Daughter states: " I want her to get as good as she can get, I am hopeful."  [employer]: Other (Comment) (Resident [employer]) Premorbid Home Care/DME Agencies: None Transportation available at discharge: Family and facility Resource referrals recommended: Support group (specify) (CVA SUpport group)  Discharge Planning Living Arrangements: Other (Comment) (Assisted Living Facility) Support Systems: Spouse/significant other;Children;Church/faith community Type of Residence: Assisted living Insurance  Resources: Media planner (specify) Passenger transport manager) Financial Resources: Social Security Financial Screen Referred: No Living Expenses: Other (Comment) (ALF) Money Management: Family Does the patient have any problems obtaining your  medications?: No Home Management: Facility Patient/Family Preliminary Plans: Return to [facility] and recieve assistance form them, if necessary.  Was ambulating with a rolling walker prior to admission here.  Husband at the same facility in their memory care but plan is to move him to [facility] where daughter works. Social Work Anticipated Follow Up Needs: HH/OP;Support Group  Clinical Impression Pleasant female who is willing to try and recover as much as she can here.  Language is her main deficit and she is working hard in speech therapy.  Very supportive and involved family. Will coordinate with [facility] upon pt's return and work with family.  Lucy Chris 05/24/2013, 3:46 PM

## 2013-05-24 NOTE — Care Management Note (Signed)
Inpatient Rehabilitation Center Individual Statement of Services  Patient Name:  Melanie Wood  Date:  05/24/2013  Welcome to the Inpatient Rehabilitation Center.  Our goal is to provide you with an individualized program based on your diagnosis and situation, designed to meet your specific needs.  With this comprehensive rehabilitation program, you will be expected to participate in at least 3 hours of rehabilitation therapies Monday-Friday, with modified therapy programming on the weekends.  Your rehabilitation program will include the following services:  Physical Therapy (PT), Occupational Therapy (OT), Speech Therapy (ST), 24 hour per day rehabilitation nursing, Case Management (Social Worker), Rehabilitation Medicine, Nutrition Services and Pharmacy Services  Weekly team conferences will be held on Wednesday to discuss your progress.  Your Social Worker will talk with you frequently to get your input and to update you on team discussions.  Team conferences with you and your family in attendance may also be held.  Expected length of stay: 7-10 days Overall anticipated outcome: Mod/i level  Depending on your progress and recovery, your program may change. Your Social Worker will coordinate services and will keep you informed of any changes. Your Social Worker's name and contact numbers are listed  below.  The following services may also be recommended but are not provided by the Inpatient Rehabilitation Center:    Home Health Rehabiltiation Services  Outpatient Rehabilitation Services    Arrangements will be made to provide these services after discharge if needed.  Arrangements include referral to agencies that provide these services.  Your insurance has been verified to be:  [employer] primary doctor is:  Dr  Pertinent information will be shared with your doctor and your insurance company.  Social Worker:  Dossie Der, SW [phone] or [phone]  Information  discussed with and copy given to patient by: Lucy Chris, 05/24/2013, 3:23 PM

## 2013-05-24 NOTE — Progress Notes (Signed)
Patient information reviewed and entered into eRehab system by Wael Maestas, RN, CRRN, PPS Coordinator.  Information including medical coding and functional independence measure will be reviewed and updated through discharge.     Per nursing patient was given "Data Collection Information Summary for Patients in Inpatient Rehabilitation Facilities with attached "Privacy Act Statement-Health Care Records" upon admission.  

## 2013-05-25 ENCOUNTER — Inpatient Hospital Stay (HOSPITAL_COMMUNITY): Payer: Medicare Other | Admitting: Speech Pathology

## 2013-05-25 DIAGNOSIS — I6350 Cerebral infarction due to unspecified occlusion or stenosis of unspecified cerebral artery: Secondary | ICD-10-CM

## 2013-05-25 NOTE — Progress Notes (Signed)
Patient ID: Melanie Wood, female   DOB: 15-Sep-1929, 79 y.o.   MRN: [id number]  Melanie Wood is a 79 y.o. female 04-10-1930 [id number]  Subjective: No new complaints. No new problems. Slept well. Feeling OK.  Objective: Vital signs in last 24 hours: Temp:  [97.5 F (36.4 C)-97.7 F (36.5 C)] 97.5 F (36.4 C) (09/27 0635) Pulse Rate:  [67-79] 67 (09/27 0635) Resp:  [18-19] 18 (09/27 0635) BP: (143-152)/(86-94) 143/86 mmHg (09/27 0635) SpO2:  [97 %-98 %] 97 % (09/27 0635) Weight change:  Last BM Date: 05/22/13  Intake/Output from previous day: 09/26 0701 - 09/27 0700 In: 380 [P.O.:380] Out: 10 [Drains:10]  Physical Exam  Genral: NAD, dtr at side Cardio: irregular irreg, no BLE edema Resp: CTA without wheeze - unlabored GI: BS positive and non distended Skin:   Wound Left lateral thigh STSG donor site and Other Left lateral calf full thickness excision Neuro: Cranial Nerve II-XII normal, aphasia, Dysarthric and Aphasic  Lab Results: BMET    Component Value Date/Time   NA 142 05/24/2013 0730   K 3.6 05/24/2013 0730   CL 105 05/24/2013 0730   CO2 26 05/24/2013 0730   GLUCOSE 143* 05/24/2013 0730   BUN 5* 05/24/2013 0730   CREATININE 0.41* 05/24/2013 0730   CALCIUM 8.4 05/24/2013 0730   GFRNONAA >90 05/24/2013 0730   GFRAA >90 05/24/2013 0730   CBC    Component Value Date/Time   WBC 7.6 05/24/2013 0730   RBC 3.66* 05/24/2013 0730   HGB 10.9* 05/24/2013 0730   HCT 34.0* 05/24/2013 0730   PLT 256 05/24/2013 0730   MCV 92.9 05/24/2013 0730   MCH 29.8 05/24/2013 0730   MCHC 32.1 05/24/2013 0730   RDW 14.8 05/24/2013 0730   LYMPHSABS 1.2 05/24/2013 0730   MONOABS 0.5 05/24/2013 0730   EOSABS 0.2 05/24/2013 0730   BASOSABS 0.0 05/24/2013 0730   CBG's (last 3):   Recent Labs  05/23/13 2101 05/24/13 0732 05/24/13 1151  GLUCAP 145* 138* 129*   LFT's Lab Results  Component Value Date   ALT 17 05/24/2013   AST 23 05/24/2013   ALKPHOS 65 05/24/2013   BILITOT 0.4 05/24/2013     Studies/Results: No results found.  Medications:  I have reviewed the patient's current medications. Scheduled Medications: . antiseptic oral rinse  15 mL Mouth Rinse BID  . atorvastatin  40 mg Oral q1800  . feeding supplement  237 mL Oral BID BM  . guaiFENesin  600 mg Oral BID  . lisinopril  1.25 mg Oral Daily  . mometasone-formoterol  2 puff Inhalation BID  . pantoprazole  40 mg Oral QHS  . rivaroxaban  15 mg Oral Q supper  . tiotropium  18 mcg Inhalation Daily   PRN Medications: acetaminophen, albuterol, alum & mag hydroxide-simeth, benzonatate, bisacodyl, guaiFENesin-dextromethorphan, ipratropium, prochlorperazine, prochlorperazine, prochlorperazine, senna-docusate, traMADol  Assessment/Plan: Principal Problem:   CVA (cerebral vascular accident)-bicerebral/bicerebellar embolic infarcts--largest left posterior frontal Active Problems:   Aphasia   Atrial fibrillation   Melanoma left shin--s/p excision with skin graft   Assessment/Plan: 1. Functional deficits secondary to Left posterior frontal embolic infarct which require 3+ hours per day of interdisciplinary therapy in a comprehensive inpatient rehab setting. Physiatrist is providing close team supervision and 24 hour management of active medical problems listed below. Physiatrist and rehab team continue to assess barriers to discharge/monitor patient progress toward functional and medical goals.  Medical Problem List and Plan:  1. DVT Prophylaxis/Anticoagulation: Pharmaceutical: Xarelto  2. Pain Management:  prn  ultram.  3. Mood: LCSW to follow for evaluation.  4. Neuropsych: This patient is capable of making decisions on her own behalf.  5. HTN: Monitor with bid checks. Blood pressures labile--  6. Melanoma left lateral calf: S/P excision Daily dressing change to left calf--to be kept moist with change of topper daily.  7. A fib: Monitor heart rate with bid checks as may increase with activity.  off coreg at this  time.  8. COPD - continue Spiriva and home nebs - monitor during therapy  Length of stay, days: 2  Ok for grounds pass with family  Vikki Ports A. Felicity Coyer, MD 05/25/2013, 11:27 AM

## 2013-05-25 NOTE — Progress Notes (Signed)
Speech Language Pathology Daily Session Note  Patient Details  Name: Melanie Wood MRN: [id number] Date of Birth: 04/25/1930  Today's Date: 05/25/2013 Time: 0830-0900 Time Calculation (min): 30 min  Short Term Goals: Week 1: SLP Short Term Goal 1 (Week 1): Pt will communicate her basic wants/needs using multimodal communication with Mod cues. SLP Short Term Goal 2 (Week 1): Pt will utilize external memory aids with Mod cues. SLP Short Term Goal 3 (Week 1): Pt will consume dys. 3 textures and thin liquids with minimal overt s/s of aspiration. SLP Short Term Goal 4 (Week 1): Pt will demonstrate efficient mastication with trials of regular textures.  Skilled Therapeutic Interventions: Skilled treatment session focused on addressing language goals.  SLP reviewed communication board; patient required Mod cues to identify needs not listed onboard, which SLP added to personalize basic needs and wants.  SLP also facilitated session with visual cues from menu and Mod question cues to express meal choice items.  Continue with current plan of care.    FIM:  Comprehension Comprehension Mode: Auditory Comprehension: 5-Follows basic conversation/direction: With no assist Expression Expression Mode: Verbal Expression: 3-Expresses basic 50 - 74% of the time/requires cueing 25 - 50% of the time. Needs to repeat parts of sentences. Social Interaction Social Interaction: 5-Interacts appropriately 90% of the time - Needs monitoring or encouragement for participation or interaction. Problem Solving Problem Solving: 5-Solves basic 90% of the time/requires cueing < 10% of the time Memory Memory: 4-Recognizes or recalls 75 - 89% of the time/requires cueing 10 - 24% of the time  Pain Pain Assessment Pain Assessment: 0-10 Pain Score: 5  Pain Type: Acute pain Pain Location: Calf Pain Orientation: Left;Posterior Pain Descriptors / Indicators: Aching Pain Onset: Gradual Pain Intervention(s): RN made  aware;Other (Comment) (RN administered meds) Multiple Pain Sites: No  Therapy/Group: Individual Therapy  Charlane Ferretti., CCC-SLP [phone]  Melanie Wood 05/25/2013, 10:33 AM

## 2013-05-26 ENCOUNTER — Inpatient Hospital Stay (HOSPITAL_COMMUNITY): Payer: Medicare Other | Admitting: Physical Therapy

## 2013-05-26 ENCOUNTER — Inpatient Hospital Stay (HOSPITAL_COMMUNITY): Payer: Medicare Other

## 2013-05-26 MED ORDER — LISINOPRIL 2.5 MG PO TABS: 1.2500 mg | ORAL_TABLET | Freq: Every day | ORAL | Status: DC

## 2013-05-26 MED ORDER — LISINOPRIL 2.5 MG PO TABS
1.2500 mg | ORAL_TABLET | Freq: Every day | ORAL | Status: DC
  Administered 2013-05-26 – 2013-06-03 (×9): 1.25 mg via ORAL
  Filled 2013-05-26 (×11): qty 0.5

## 2013-05-26 NOTE — Progress Notes (Signed)
Physical Therapy Note  Patient Details  Name: Melanie Wood MRN: [id number] Date of Birth: 1930/07/20 Today's Date: 05/26/2013  [id number] (55 minutes) individual Pain: no reported pain Focus of treatment: Therapeutic exercises focused bilateral LE strengthening/ activity tolerance; therapeutic activities focused on standing balance; gait training/ endurance Treatment: Pt in recliner upon arrival; transfer stand/turn min assist; Nustep Level 4 X 3 minutes with Oxygen sats > 92% RA pulse 95; Nustep additional 2 minutes before fatigue; gait 76 feet RW min assist for safety; standing - pt able to stand X 2 minutes without UE support with min sway noted; pt performed reaching activity to right and left in standing with one episode of self corrected instability; Biodex - pt able to perform weight shift activity (at lowest intensity) without UE assist but required bilateral UE support to perform limits of stability activity.   1300-1340 (40 minutes) individual Pain: no c/o pain Focus of treatment: therapeutic activities focused on protective balance reactions in standing ; gait training without AD to facilitate balance reactions Treatment: Pt up in wc; Biodex Limits of Stability to facilitate ankle, hip strategies ( requires tactile cues for ankle strategies); gait forward/ backward in parallel bars X 1 without using rails as possible; pt reports feeling dizzy with exertion ( BP in sitting 147/93, pulsew 85, Oxygen sats 98% RA ; pt requested to use bathroom , min assist RW; ; standing hygiene SBA; returned to bed ; nurse notified of patients symptoms. Bed alarm activated.   Bulah Lurie,JIM 05/26/2013, 9:05 AM

## 2013-05-26 NOTE — Progress Notes (Signed)
Occupational Therapy Session Note  Patient Details  Name: Melanie Wood MRN: [id number] Date of Birth: February 22, 1930  Today's Date: 05/26/2013 Time: 0800-0856 Time Calculation (min): 56 min  Short Term Goals: Week 1:  OT Short Term Goal 1 (Week 1): STG = LTGs due to short LOS  Skilled Therapeutic Interventions/Progress Updates:    Pt sitting in recliner with daughter at side upon arrival.  Pt agreeable to bathing and dressing w/c level at sink.  Pt amb with RW to closet to select clothing before sitting in w/c at sink.  Pt required assistance with bathing BLE (feet) and threading underpants without use of AE.  Pt attempted to thread underpants while standing and required min verbal cues to sit in w/c to attempt to thread underpants and pants. Pt was able to don socks and shoes with use of sock aide and long handle shoe horn.  Pt brushed teeth and hair while standing at sink.  Pt exhibited no difficulty following commands, initiating tasks, or sequencing throughout session.  Pt exhibited difficulty communicating and became frustrated when attempting.  Focus on activity tolerance, dynamic standing balance, and safety awareness.  Therapy Documentation Precautions:  Precautions Precautions: Fall;Other (comment) (Wound on L lateral thigh at graft, L calf melanoma removal) Precaution Comments: JP drain L groin, lymph nodes removed Restrictions Weight Bearing Restrictions: No Pain: Pain Assessment Pain Assessment: No/denies pain  See FIM for current functional status  Therapy/Group: Individual Therapy  Rich Brave 05/26/2013, 8:58 AM

## 2013-05-26 NOTE — Progress Notes (Signed)
Occupational Therapy Session Note  Patient Details  Name: Melanie Wood MRN: [id number] Date of Birth: 30-Jun-1930  Today's Date: 05/26/2013 Time: 1431-1500 Time Calculation (min): 29 min  Short Term Goals: Week 1:  OT Short Term Goal 1 (Week 1): STG = LTGs due to short LOS  Skilled Therapeutic Interventions/Progress Updates: ADL-retraining with emphasis on lower body dressing using AE, functional transfers, and bed mobility.   Patient reported difficulty with her transfers in/out of bed and with her bed mobility which currently requires min-mod assist to manage legs when rolling to her left from supine and sitting at edge of bed and when lying down from sitting to left side-lying to supine.  Patient was unable to improve despite repeated attempts and re-ed on technique.   When challenged to attempt transfer and bed mobility on right side of bed side, sitting to side-lying to to right then to supine, patient did not require physical assist to bring her legs in or to roll to her left.   Patient able to don socks using sock aid and min assist to manage device when confused by position of pull cords.    Therapy Documentation Precautions:  Precautions Precautions: Fall;Other (comment) (Wound on L lateral thigh at graft, L calf melanoma removal) Precaution Comments: JP drain L groin, lymph nodes removed Restrictions Weight Bearing Restrictions: No  Vital Signs: Therapy Vitals Temp: 98.5 F (36.9 C) Temp src: Oral Pulse Rate: 82 Resp: 19 BP: 124/67 mmHg Patient Position, if appropriate: Sitting Oxygen Therapy SpO2: 95 % O2 Device: None (Room air)  Pain: Pain Assessment Pain Assessment: No/denies pain Pain Score: 2   See FIM for current functional status  Therapy/Group: Individual Therapy  Georgeanne Nim 05/26/2013, 3:55 PM

## 2013-05-26 NOTE — Progress Notes (Signed)
Patient ID: Melanie Wood, female   DOB: February 01, 1930, 79 y.o.   MRN: [id number]   Subjective: In the gym, working with therapy. No new complaints. No new problems. Slept well. Feeling OK.  Objective: Vital signs in last 24 hours: Temp:  [97.8 F (36.6 C)-98.2 F (36.8 C)] 97.8 F (36.6 C) (09/28 0601) Pulse Rate:  [82-89] 89 (09/28 0601) Resp:  [18] 18 (09/28 0601) BP: (117-120)/(78-81) 117/78 mmHg (09/28 0601) SpO2:  [96 %] 96 % (09/28 0601) Weight change:  Last BM Date: 05/22/13  Intake/Output from previous day: 09/27 0701 - 09/28 0700 In: 600 [P.O.:600] Out: 4 [Drains:4]  Physical Exam  Genral: NAD, aphasic without change Cardio: irregular irreg, no BLE edema Resp: CTA without wheeze - unlabored GI: BS positive and non distended Neuro: Cranial Nerve II-XII normal, Dysarthric and Aphasic without change  Lab Results: BMET    Component Value Date/Time   NA 142 05/24/2013 0730   K 3.6 05/24/2013 0730   CL 105 05/24/2013 0730   CO2 26 05/24/2013 0730   GLUCOSE 143* 05/24/2013 0730   BUN 5* 05/24/2013 0730   CREATININE 0.41* 05/24/2013 0730   CALCIUM 8.4 05/24/2013 0730   GFRNONAA >90 05/24/2013 0730   GFRAA >90 05/24/2013 0730   CBC    Component Value Date/Time   WBC 7.6 05/24/2013 0730   RBC 3.66* 05/24/2013 0730   HGB 10.9* 05/24/2013 0730   HCT 34.0* 05/24/2013 0730   PLT 256 05/24/2013 0730   MCV 92.9 05/24/2013 0730   MCH 29.8 05/24/2013 0730   MCHC 32.1 05/24/2013 0730   RDW 14.8 05/24/2013 0730   LYMPHSABS 1.2 05/24/2013 0730   MONOABS 0.5 05/24/2013 0730   EOSABS 0.2 05/24/2013 0730   BASOSABS 0.0 05/24/2013 0730   CBG's (last 3):    Recent Labs  05/23/13 2101 05/24/13 0732 05/24/13 1151  GLUCAP 145* 138* 129*   LFT's Lab Results  Component Value Date   ALT 17 05/24/2013   AST 23 05/24/2013   ALKPHOS 65 05/24/2013   BILITOT 0.4 05/24/2013    Studies/Results: No results found.  Medications:  I have reviewed the patient's current medications. Scheduled  Medications: . antiseptic oral rinse  15 mL Mouth Rinse BID  . atorvastatin  40 mg Oral q1800  . feeding supplement  237 mL Oral BID BM  . guaiFENesin  600 mg Oral BID  . lisinopril  1.25 mg Oral Daily  . mometasone-formoterol  2 puff Inhalation BID  . pantoprazole  40 mg Oral QHS  . rivaroxaban  15 mg Oral Q supper  . tiotropium  18 mcg Inhalation Daily   PRN Medications: acetaminophen, albuterol, alum & mag hydroxide-simeth, benzonatate, bisacodyl, guaiFENesin-dextromethorphan, ipratropium, prochlorperazine, prochlorperazine, prochlorperazine, senna-docusate, traMADol  Assessment/Plan: Principal Problem:   CVA (cerebral vascular accident)-bicerebral/bicerebellar embolic infarcts--largest left posterior frontal Active Problems:   Aphasia   Atrial fibrillation   Melanoma left shin--s/p excision with skin graft   Assessment/Plan: 1. Functional deficits secondary to Left posterior frontal embolic infarct which require 3+ hours per day of interdisciplinary therapy in a comprehensive inpatient rehab setting. Physiatrist is providing close team supervision and 24 hour management of active medical problems listed below. Physiatrist and rehab team continue to assess barriers to discharge/monitor patient progress toward functional and medical goals.  Medical Problem List and Plan:  1. DVT Prophylaxis/Anticoagulation: Pharmaceutical: Xarelto  2. Pain Management:  prn ultram.  3. Mood: LCSW to follow for evaluation.  4. Neuropsych: This patient is capable of making decisions on  her own behalf.  5. HTN: Monitor with bid checks. Blood pressures labile--  6. Melanoma left lateral calf: S/P excision Daily dressing change to left calf--to be kept moist with change of topper daily.  7. A fib: Monitor heart rate with bid checks as may increase with activity.  off coreg at this time.  8. COPD - continue Spiriva and home nebs - monitor during therapy  Length of stay, days: 3  given grounds pass  with family 05/25/13  Vikki Ports A. Felicity Coyer, MD 05/26/2013, 9:46 AM

## 2013-05-27 ENCOUNTER — Inpatient Hospital Stay (HOSPITAL_COMMUNITY): Payer: Medicare Other

## 2013-05-27 ENCOUNTER — Inpatient Hospital Stay (HOSPITAL_COMMUNITY): Payer: Medicare Other | Admitting: Physical Therapy

## 2013-05-27 ENCOUNTER — Inpatient Hospital Stay (HOSPITAL_COMMUNITY): Payer: Medicare Other | Admitting: Occupational Therapy

## 2013-05-27 NOTE — Progress Notes (Addendum)
Physical Therapy Note  Patient Details  Name: Melanie Wood MRN: [id number] Date of Birth: February 26, 1930 Today's Date: 05/27/2013  1330-1415 (45 minutes) individual Pain: no complaint of pain Focus of treatment: Therapeutic activities focused on standing balance / ankle- hip protective balance strategies; gait training Treatment: Pt in bed upon arrival ; supine to sit SBA; transfer stand/turn RW SBA; standing - reaching activities on firm and soft surfaces to facilitate ankle/ hip strategies ( pt attempts  to utilize stepping strategy most of the time when reaching outside BOS ) ; pt unable to maintain static standing on soft surface > 1 minute, 2 minutes on firm surface) ; standing raising ball in front to challenge balance min assist ; gait to room 110 feet RW SBA followed by wc for safety. Daughter in room .   Lovette Merta,JIM 05/27/2013, 2:09 PM

## 2013-05-27 NOTE — Plan of Care (Signed)
Problem: Consults Goal: Diabetes Guidelines if Diabetic/Glucose > 140 If diabetic or lab glucose is > 140 mg/dl - Initiate Diabetes/Hyperglycemia Guidelines & Document Interventions  Outcome: Adequate for Discharge Patient and family report patient is not diabetic and reports they understand diabetes management .

## 2013-05-27 NOTE — Progress Notes (Addendum)
Patient ID: Melanie Wood, female   DOB: 01-30-1930, 79 y.o.   MRN: [id number] Subjective/Complaints: Melanie Wood is a 79 y.o. RH-female with history of A fib, HTN, who was taken off Xarelto to have melanoma removed. On 05/17/13, she was admitted with slurred speech with expressive difficulties, right facial droop and RUE weakness. MRI/MRA brain showed acute bilateral cerebral infarcts in bilateral frontal lobes (-largest in left posterior), Bilateral occipital and rigth thalamus; no stenosis. 2D echo with EF 55-60% and no wall abnormality. Neurology consulted and recommended resuming Xarelto for embolic stroke due to A Fib. CCS consulted for input on VAC and dressing changed yesterday. Family asking about culture results Review of Systems - unable to obtain secondary to aphasia Objective: Vital Signs: Blood pressure 148/96, pulse 82, temperature 97.4 F (36.3 C), temperature source Oral, resp. rate 18, height 5\' 2"  (1.575 m), weight 81.239 kg (179 lb 1.6 oz), SpO2 97.00%. No results found. Results for orders placed during the hospital encounter of 05/23/13 (from the past 72 hour(s))  GLUCOSE, CAPILLARY     Status: Abnormal   Collection Time    05/24/13 11:51 AM      Result Value Range   Glucose-Capillary 129 (*) 70 - 99 mg/dL     HEENT: normal Cardio: irregular Resp: Wheezes and unlabored GI: BS positive and non distended Extremity:  No Edema Skin:   Wound Left lateral thigh STSG donor site and Other Left lateral calf full thickness excision Neuro: Cranial Nerve II-XII normal, Abnormal Sensory cannot assess secondary to aphasia, Normal Motor, Abnormal FMC Ataxic/ dec FMC, Dysarthric and Aphasic Musc/Skel:  Normal Gen NAD   Assessment/Plan: 1. Functional deficits secondary to Left posterior frontal embolic infarct which require 3+ hours per day of interdisciplinary therapy in a comprehensive inpatient rehab setting. Physiatrist is providing close team supervision and 24 hour  management of active medical problems listed below. Physiatrist and rehab team continue to assess barriers to discharge/monitor patient progress toward functional and medical goals. FIM: FIM - Bathing Bathing Steps Patient Completed: Chest;Right Arm;Left Arm;Abdomen;Front perineal area;Buttocks;Right upper leg;Left upper leg Bathing: 4: Min-Patient completes 8-9 66f 10 parts or 75+ percent  FIM - Upper Body Dressing/Undressing Upper body dressing/undressing steps patient completed: Thread/unthread right sleeve of front closure shirt/dress;Thread/unthread left sleeve of front closure shirt/dress;Pull shirt around back of front closure shirt/dress;Button/unbutton shirt Upper body dressing/undressing: 5: Set-up assist to: Obtain clothing/put away FIM - Lower Body Dressing/Undressing Lower body dressing/undressing steps patient completed: Pull underwear up/down;Thread/unthread right pants leg;Thread/unthread left pants leg;Pull pants up/down;Don/Doff right sock;Don/Doff left sock;Don/Doff right shoe;Don/Doff left shoe Lower body dressing/undressing: 4: Min-Patient completed 75 plus % of tasks  FIM - Toileting Toileting steps completed by patient: Adjust clothing prior to toileting;Performs perineal hygiene Toileting Assistive Devices: Grab bar or rail for support Toileting: 3: Mod-Patient completed 2 of 3 steps  FIM - Diplomatic Services operational officer Devices: Best boy Transfers: 4-To toilet/BSC: Min A (steadying Pt. > 75%);4-From toilet/BSC: Min A (steadying Pt. > 75%)  FIM - Bed/Chair Transfer Bed/Chair Transfer Assistive Devices: Arm rests Bed/Chair Transfer: 4: Sit > Supine: Min A (steadying pt. > 75%/lift 1 leg);4: Bed > Chair or W/C: Min A (steadying Pt. > 75%);4: Chair or W/C > Bed: Min A (steadying Pt. > 75%);4: Supine > Sit: Min A (steadying Pt. > 75%/lift 1 leg)  FIM - Locomotion: Wheelchair Distance: 40 Locomotion: Wheelchair: 1: Travels less than 50 ft  with supervision, cueing or coaxing FIM - Locomotion: Ambulation Locomotion: Ambulation Assistive Devices:  Walker - Rolling Ambulation/Gait Assistance: 4: Min assist Locomotion: Ambulation: 1: Travels less than 50 ft with minimal assistance (Pt.>75%)  Comprehension Comprehension Mode: Auditory Comprehension: 5-Follows basic conversation/direction: With extra time/assistive device  Expression Expression Mode: Verbal Expression: 3-Expresses basic 50 - 74% of the time/requires cueing 25 - 50% of the time. Needs to repeat parts of sentences.  Social Interaction Social Interaction: 5-Interacts appropriately 90% of the time - Needs monitoring or encouragement for participation or interaction.  Problem Solving Problem Solving: 5-Solves basic 90% of the time/requires cueing < 10% of the time  Memory Memory: 4-Recognizes or recalls 75 - 89% of the time/requires cueing 10 - 24% of the time   Medical Problem List and Plan:  1. DVT Prophylaxis/Anticoagulation: Pharmaceutical: Xarelto  2. Pain Management: Will add prn ultram.  3. Mood: LCSW to follow for evaluation.  4. Neuropsych: This patient is capable of making decisions on her own behalf.  5. HTN: Monitor with bid checks. Blood pressures labile---resume lisinopril  6. Melanoma left lateral calf: S/P excision Daily dressing change to left calf--to be kept moist with change of topper daily. Path pending will see if report is available, has melanoma, need to see if edges clean, f/u surgery post hospital, may remove groin drain when output <30cc/24hr 7. A fib: Monitor heart rate with bid checks as may increase with activity. Is off coreg at this time.  8. Hypokalemia: Recheck in am. Will supplement briefly--dilutional due to IVF. Discontinue IVF.  9. 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  Wheezing no SOB monitor during therapy, has inhalers LOS (Days) 4 A FACE TO FACE EVALUATION WAS  PERFORMED  Patrick Sohm E 05/27/2013, 8:04 AM

## 2013-05-27 NOTE — Progress Notes (Signed)
Occupational Therapy Session Note  Patient Details  Name: Jersie Beel MRN: [id number] Date of Birth: [birth year]  Today's Date: 05/27/2013 Time: 0800-0900 Time Calculation (min): 60 min  Short Term Goals: No short term goals set  Skilled Therapeutic Interventions/Progress Updates:      Pt seen for BADL retraining of toileting, bathing, and dressing with a focus on balance, activity tolerance, use of AE, and sit to stand skills.  Pt was able to walk to bathroom with supervision with RW, complete toileting with supervision.  For dressing, she was having more difficulty reaching down to feet than she did the first day. Pt provided with a reacher and she was able to don clothing over feet but needed multiple tries with multiple rest breaks as she was having difficulty managing the reacher.  She was able to use the sock aid and shoe horn with minimal difficulty. She had difficulty with sit to stand a few times, but was able to stand without assist with cues to lean forward.  Pt needs numerous rest breaks as she fatigues easily.  Pt resting in recliner at end of session with daughter in the room.  Therapy Documentation Precautions:  Precautions Precautions: Fall;Other (comment) (Wound on L lateral thigh at graft, L calf melanoma removal) Precaution Comments: JP drain L groin, lymph nodes removed Restrictions Weight Bearing Restrictions: No    Vital Signs: Therapy Vitals BP: 140/88 mmHg Pain: Pain Assessment Pain Assessment: 0-10 Pain Score: 7  Pain Type: Acute pain Pain Location: Leg Pain Orientation: Left Pain Descriptors / Indicators: Aching Pain Frequency: Intermittent Pain Onset: Gradual Pain Intervention(s):  (premedicated) ADL:  See FIM for current functional status  Therapy/Group: Individual Therapy  Jomayra Novitsky 05/27/2013, 9:22 AM

## 2013-05-27 NOTE — Progress Notes (Addendum)
Physical Therapy Note  Patient Details  Name: Melanie Wood MRN: [id number] Date of Birth: [birth year] Today's Date: 05/27/2013 1100-1200 60 min individual therapy  Pt on toilet at start of session.  Toileting with supervision.  Gait in congested room with Rw, and dynamic balance to wash hands in standing, with close supervision.  Pain L thigh graft donor site 5/10 with movement; RN aware and pt premedicated.  tx focused on neuromuscular re-education via forced use, demo, manual contacts, VCs for wt shifting in sitting, trunk shortening/lengthening/rotating in sitting with reaching, Fall Prevention I exs, R stance phase and swing phase components of gait, bil bridging and scooting forward/backward for bed mobility  Gait with RW x 110' x 2 with close supervision/min guard for turns, working on R hip and knee flexion.  Ruberta Holck 05/27/2013, 8:00 AM

## 2013-05-27 NOTE — Progress Notes (Signed)
Speech Language Pathology Daily Session Note  Patient Details  Name: Melanie Wood MRN: [id number] Date of Birth: 10-13-1929  Today's Date: 05/27/2013 Time: 1030-1100 Time Calculation (min): 30 min  Short Term Goals: Week 1: SLP Short Term Goal 1 (Week 1): Pt will communicate her basic wants/needs using multimodal communication with Mod cues. SLP Short Term Goal 2 (Week 1): Pt will utilize external memory aids with Mod cues. SLP Short Term Goal 3 (Week 1): Pt will consume dys. 3 textures and thin liquids with minimal overt s/s of aspiration. SLP Short Term Goal 4 (Week 1): Pt will demonstrate efficient mastication with trials of regular textures.  Skilled Therapeutic Interventions: Skilled treatment focused on language goals. SLP facilitated session with review of communication board, that was located across the room from the patient. SLP placed on table within reach of patient. Pt participated in naming tasks with greater than 75% accuracy with confrontationla and responsive naming with min phonemic cues. SLP introduced word-finding strategies and provided Max cues during descriptive language task. Continue plan of care.   FIM:  Comprehension Comprehension Mode: Auditory Comprehension: 5-Follows basic conversation/direction: With no assist Expression Expression Mode: Verbal Expression: 3-Expresses basic 50 - 74% of the time/requires cueing 25 - 50% of the time. Needs to repeat parts of sentences. Social Interaction Social Interaction: 5-Interacts appropriately 90% of the time - Needs monitoring or encouragement for participation or interaction. Problem Solving Problem Solving: 5-Solves basic 90% of the time/requires cueing < 10% of the time Memory Memory: 4-Recognizes or recalls 75 - 89% of the time/requires cueing 10 - 24% of the time  Pain Pain Assessment Pain Assessment: 0-10 Pain Score: 6  Pain Type: Acute pain Pain Location: Leg Pain Orientation: Left Pain Descriptors /  Indicators: Burning Pain Frequency: Intermittent Pain Onset: Gradual Patients Stated Pain Goal: 2 Pain Intervention(s): Medication (See eMAR) (tylenol 650 mg po)  Therapy/Group: Individual Therapy  Maxcine Ham 05/27/2013, 12:24 PM  Maxcine Ham, M.A. CCC-SLP [phone]

## 2013-05-28 ENCOUNTER — Inpatient Hospital Stay (HOSPITAL_COMMUNITY): Payer: Medicare Other

## 2013-05-28 ENCOUNTER — Inpatient Hospital Stay (HOSPITAL_COMMUNITY): Payer: Medicare Other | Admitting: Physical Therapy

## 2013-05-28 ENCOUNTER — Inpatient Hospital Stay (HOSPITAL_COMMUNITY): Payer: Medicare Other | Admitting: Occupational Therapy

## 2013-05-28 ENCOUNTER — Inpatient Hospital Stay (HOSPITAL_COMMUNITY): Payer: Medicare Other | Admitting: *Deleted

## 2013-05-28 NOTE — Progress Notes (Signed)
Occupational Therapy Session Note  Patient Details  Name: Melanie Wood MRN: [id number] Date of Birth: [birth year]  Today's Date: 05/28/2013 Time: 0800-0900 Time Calculation (min): 60 min  Short Term Goals: No short term goals set  Skilled Therapeutic Interventions/Progress Updates:      Pt seen for BADL retraining of toileting, bathing, and dressing with a focus on activity tolerance, use of AE, standing balance.  Pt was c/o increased leg pain and swelling, nursing aware.  She was able to ambulate around room with RW with close supervision and cues to lean forward to come into a stand.  She used AE to don clothing but was easily frustrated today as she was having difficulty and it took her several tries.  Pt continues to need numerous rest breaks.  She stands at sink bathe as much as possible then sits when fatigued or she needs to wash her feet.  Pt resting in recliner at end of session with call light in reach.  Therapy Documentation Precautions:  Precautions Precautions: Fall;Other (comment) (Wound on L lateral thigh at graft, L calf melanoma removal) Precaution Comments: JP drain L groin, lymph nodes removed Restrictions Weight Bearing Restrictions: No    Vital Signs: Therapy Vitals BP: 141/74 mmHg Pain: Pain Assessment Faces Pain Scale: Hurts even more Pain Type: Acute pain Pain Location: Leg Pain Orientation: Left Pain Descriptors / Indicators: Aching Pain Onset: On-going Pain Intervention(s): RN made aware ADL:   See FIM for current functional status  Therapy/Group: Individual Therapy  Adell Koval 05/28/2013, 11:01 AM

## 2013-05-28 NOTE — Progress Notes (Signed)
Speech Language Pathology Daily Session Note  Patient Details  Name: Melanie Wood MRN: [id number] Date of Birth: 1929/09/14  Today's Date: 05/28/2013 Time: 8657-8469 Time Calculation (min): 30 min  Short Term Goals: Week 1: SLP Short Term Goal 1 (Week 1): Pt will communicate her basic wants/needs using multimodal communication with Mod cues. SLP Short Term Goal 2 (Week 1): Pt will utilize external memory aids with Mod cues. SLP Short Term Goal 3 (Week 1): Pt will consume dys. 3 textures and thin liquids with minimal overt s/s of aspiration. SLP Short Term Goal 4 (Week 1): Pt will demonstrate efficient mastication with trials of regular textures.  Skilled Therapeutic Interventions: Treatment focused on linguistic goals. SLP facilitated session with Mod cues to review options and place orders for lunch and dinner. Pt participated in word-generation task with Mod cues. Pt's speech continues to be characterized by phonemic paraphasias. She uses gestures with Mod I to facilitate expressive communication. Continue plan of care.   FIM:  Comprehension Comprehension Mode: Auditory Comprehension: 5-Follows basic conversation/direction: With extra time/assistive device Expression Expression Mode: Verbal Expression: 3-Expresses basic 50 - 74% of the time/requires cueing 25 - 50% of the time. Needs to repeat parts of sentences. Social Interaction Social Interaction: 5-Interacts appropriately 90% of the time - Needs monitoring or encouragement for participation or interaction. Problem Solving Problem Solving: 4-Solves basic 75 - 89% of the time/requires cueing 10 - 24% of the time Memory Memory: 4-Recognizes or recalls 75 - 89% of the time/requires cueing 10 - 24% of the time  Pain Pain Assessment Pain Assessment: No/denies pain  Therapy/Group: Individual Therapy  Maxcine Ham 05/28/2013, 12:32 PM  Maxcine Ham, M.A. CCC-SLP [phone]

## 2013-05-28 NOTE — Progress Notes (Signed)
Physical Therapy Session Note  Patient Details  Name: Melanie Wood MRN: [id number] Date of Birth: July 14, 1930  Today's Date: 05/28/2013 Time: 1345-1430 Time Calculation (min): 45 min  Short Term Goals: Week 1:  PT Short Term Goal 1 (Week 1): STG=LTG due to short LOS  Skilled Therapeutic Interventions/Progress Updates:    Pt performed gait with RW with emphasis on navigating tight spaces and turns, navigating obstacle course with pt able to complete all 3 trials with 2 mild LOB and need for safety vcs. Up and down curbs with use of RW with min A and vcs. Standing strengthening B LEs 2 x 10 reps with pt requiring 2 seated rests. Static/dynamic balance activities in // bars with pt displaying increased LOB with single leg stance activities.  Pt continues with expressive aphasia  communicates with hand gestures, visual aids and attempts to speak . Pt very pleasant and motivated.   Therapy Documentation Precautions:  Precautions Precautions: Fall;Other (comment) (Wound on L lateral thigh at graft, L calf melanoma removal) Precaution Comments: JP drain L groin, lymph nodes removed Restrictions Weight Bearing Restrictions: No    Pain: Pain Assessment Pain Assessment: 0-10 Pain Score: 5  Pain Type: Acute pain Pain Location: Leg Pain Orientation: Left Pain Descriptors / Indicators: Aching Pain Frequency: Occasional Pain Onset: Gradual Patients Stated Pain Goal: 3 Pain Intervention(s): Medication (See eMAR)    Locomotion : Ambulation Ambulation/Gait Assistance: 5: Supervision                See FIM for current functional status  Therapy/Group: Individual Therapy  Jackelyn Knife 05/28/2013, 4:15 PM

## 2013-05-28 NOTE — Progress Notes (Signed)
Patient ID: Melanie Wood, female   DOB: February 09, 1930, 79 y.o.   MRN: [id number] Subjective/Complaints: Melanie Wood is a 79 y.o. RH-female with history of A fib, HTN, who was taken off Xarelto to have melanoma removed. On 05/17/13, she was admitted with slurred speech with expressive difficulties, right facial droop and RUE weakness. MRI/MRA brain showed acute bilateral cerebral infarcts in bilateral frontal lobes (-largest in left posterior), Bilateral occipital and rigth thalamus; no stenosis. 2D echo with EF 55-60% and no wall abnormality. Neurology consulted and recommended resuming Xarelto for embolic stroke due to A Fib. CCS consulted for input on VAC and dressing changed yesterday. Biopsy results per family received from surgeon, clean borders Review of Systems - unable to obtain secondary to aphasia Objective: Vital Signs: Blood pressure 133/71, pulse 92, temperature 97.6 F (36.4 C), temperature source Oral, resp. rate 18, height 5\' 2"  (1.575 m), weight 81.239 kg (179 lb 1.6 oz), SpO2 94.00%. No results found. No results found for this or any previous visit (from the past 72 hour(s)).   HEENT: normal Cardio: irregular Resp: Wheezes and unlabored GI: BS positive and non distended Extremity:  No Edema Skin:   Wound Left lateral thigh STSG donor site and Other Left lateral calf full thickness excision Neuro: Cranial Nerve II-XII normal, Abnormal Sensory cannot assess secondary to aphasia, Normal Motor, Abnormal FMC Ataxic/ dec FMC, Dysarthric and Aphasic Musc/Skel:  Normal Gen NAD   Assessment/Plan: 1. Functional deficits secondary to Left posterior frontal embolic infarct which require 3+ hours per day of interdisciplinary therapy in a comprehensive inpatient rehab setting. Physiatrist is providing close team supervision and 24 hour management of active medical problems listed below. Physiatrist and rehab team continue to assess barriers to discharge/monitor patient progress toward  functional and medical goals. FIM: FIM - Bathing Bathing Steps Patient Completed: Chest;Right Arm;Left Arm;Abdomen;Front perineal area;Buttocks;Right upper leg;Right lower leg (including foot);Left lower leg (including foot) (uses long handled sponge) Bathing: 5: Supervision: Safety issues/verbal cues  FIM - Upper Body Dressing/Undressing Upper body dressing/undressing steps patient completed: Thread/unthread right bra strap;Thread/unthread left bra strap;Hook/unhook bra;Thread/unthread right sleeve of pullover shirt/dresss;Thread/unthread left sleeve of pullover shirt/dress;Put head through opening of pull over shirt/dress;Pull shirt over trunk Upper body dressing/undressing: 5: Set-up assist to: Obtain clothing/put away FIM - Lower Body Dressing/Undressing Lower body dressing/undressing steps patient completed: Thread/unthread right underwear leg;Thread/unthread left underwear leg;Pull underwear up/down;Thread/unthread right pants leg;Thread/unthread left pants leg;Don/Doff right sock;Don/Doff left sock;Don/Doff right shoe;Don/Doff left shoe Lower body dressing/undressing: 5: Supervision: Safety issues/verbal cues (uses reacher, sock aid , shoe horn)  FIM - Toileting Toileting steps completed by patient: Performs perineal hygiene;Adjust clothing after toileting Toileting Assistive Devices: Grab bar or rail for support Toileting: 5: Supervision: Safety issues/verbal cues  FIM - Diplomatic Services operational officer Devices: Best boy Transfers: 5-From toilet/BSC: Supervision (verbal cues/safety issues)  FIM - Banker Devices: Arm rests Bed/Chair Transfer: 3: Supine > Sit: Mod A (lifting assist/Pt. 50-74%/lift 2 legs;0: Activity did not occur  FIM - Locomotion: Wheelchair Distance: 40 Locomotion: Wheelchair: 0: Activity did not occur FIM - Locomotion: Ambulation Locomotion: Ambulation Assistive Devices: Dealer Ambulation/Gait Assistance: 4: Min assist Locomotion: Ambulation: 2: Travels 50 - 149 ft with minimal assistance (Pt.>75%)  Comprehension Comprehension Mode: Auditory Comprehension: 5-Follows basic conversation/direction: With extra time/assistive device  Expression Expression Mode: Verbal Expression: 3-Expresses basic 50 - 74% of the time/requires cueing 25 - 50% of the time. Needs to repeat parts of sentences.  Social Interaction  Social Interaction: 5-Interacts appropriately 90% of the time - Needs monitoring or encouragement for participation or interaction.  Problem Solving Problem Solving: 4-Solves basic 75 - 89% of the time/requires cueing 10 - 24% of the time  Memory Memory: 4-Recognizes or recalls 75 - 89% of the time/requires cueing 10 - 24% of the time   Medical Problem List and Plan:  1. DVT Prophylaxis/Anticoagulation: Pharmaceutical: Xarelto  2. Pain Management: Will add prn ultram.  3. Mood: LCSW to follow for evaluation.  4. Neuropsych: This patient is capable of making decisions on her own behalf.  5. HTN: Monitor with bid checks. Blood pressures labile---resume lisinopril  6. Melanoma left lateral calf: S/P excision Daily dressing change to left calf--to be kept moist with change of topper daily. Path pending will see if report is available, has melanoma, need to see if edges clean, f/u surgery post hospital, may remove groin drain since output <30cc/24hr 7. A fib: Monitor heart rate with bid checks as may increase with activity. Is off coreg at this time.  8. Hypokalemia: Recheck in am. Will supplement briefly--dilutional due to IVF. Discontinue IVF.  9. 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  Wheezing no SOB monitor during therapy, has inhalers LOS (Days) 5 A FACE TO FACE EVALUATION WAS PERFORMED  Bascom Biel E 05/28/2013, 8:11 AM

## 2013-05-28 NOTE — Progress Notes (Signed)
Physical Therapy Session Note  Patient Details  Name: Melanie Wood MRN: [id number] Date of Birth: December 07, 1929  Today's Date: 05/28/2013 Time: 11:05-12:05 ( )   Short Term Goals: Week 1:  PT Short Term Goal 1 (Week 1): STG=LTG due to short LOS  Skilled Therapeutic Interventions/Progress Updates:  Pt up in recliner, agreeable to PT. Seems to have more difficulty getting words out today, and considerable frustrated. Emotional support provided. Tx focused on gait with RW and balance during during functional tasks,  And Nustep for generalized strengthening.   Performed toilet transfer, gait on unit, and gait in home environment with RW and close S with cues for foot clearance. Pt able to open and access low dresser drawers as well as high kitchen cabinets for dynamic balance activities with close S only and safe hand placement noted. Furniture transfers with min A from lower surfaces.   Car transfer with Min A for LLE lifting. Safe hand placement noted.   Gait on unit 4x75' with RW and close S.   Nustep x35min, level 4 with bil UE and LE with 2 brief rest breaks.   Pt left up in recliner with all needs in reach. Nursing aware to remove hot pack in .      Therapy Documentation Precautions:  Precautions Precautions: Fall;Other (comment) (Wound on L lateral thigh at graft, L calf melanoma removal) Precaution Comments: JP drain L groin, lymph nodes removed Restrictions Weight Bearing Restrictions: No    Vital Signs: Therapy Vitals BP: 141/74 mmHg Pain:  Pt c/o neck pain, unable to rate. Provided hot pack for relief Locomotion : Ambulation Ambulation/Gait Assistance: 5: Supervision   See FIM for current functional status  Therapy/Group: Individual Therapy  Clydene Laming, PT, DPT  05/28/2013, 11:44 AM

## 2013-05-29 ENCOUNTER — Inpatient Hospital Stay (HOSPITAL_COMMUNITY): Payer: Medicare Other

## 2013-05-29 MED ORDER — SENNOSIDES-DOCUSATE SODIUM 8.6-50 MG PO TABS
2.0000 | ORAL_TABLET | Freq: Every day | ORAL | Status: DC
  Administered 2013-05-29 – 2013-05-30 (×2): 2 via ORAL
  Filled 2013-05-29 (×4): qty 2

## 2013-05-29 NOTE — Patient Care Conference (Signed)
Inpatient RehabilitationTeam Conference and Plan of Care Update Date: 05/29/2013   Time: 11:15 AM    Patient Name: Melanie Wood      Medical Record Number: [id number]  Date of Birth: August 21, 1930 Sex: Female         Room/Bed: 4M01C/4M01C-01 Payor Info: Payor: [employer] [place] [employer]ICARE / Plan: BLUE MEDICARE / Product Type: *No Product type* /    Admitting Diagnosis: LT CVA  Admit Date/Time:  05/23/2013  3:54 PM Admission Comments: No comment available   Primary Diagnosis:  CVA (cerebral vascular accident) Principal Problem: CVA (cerebral vascular accident)  Patient Active Problem List   Diagnosis Date Noted  . Melanoma left shin--s/p excision with skin graft 05/24/2013  . CVA (cerebral vascular accident)-bicerebral/bicerebellar embolic infarcts--largest left posterior frontal 05/23/2013  . Left sided cerebral hemisphere cerebrovascular accident 05/23/2013  . Cerebrovascular accident, embolic 05/23/2013  . Other and unspecified hyperlipidemia 05/23/2013  . Encounter for long-term (current) use of medications 05/23/2013  . CVA (cerebral infarction) 05/18/2013  . Aphasia 05/18/2013  . Atrial fibrillation 05/18/2013    Expected Discharge Date: Expected Discharge Date: 06/03/13  Team Members Present: Physician leading conference: Dr. Claudette Laws Social Worker Present: Dossie Der, LCSW;Jenny Prevatt, LCSW Nurse Present: Leland Johns, RN PT Present: Wanda Plump, PT OT Present: Rosalio Loud, OT;Kris Gellert, Heath Lark, OT SLP Present: Fae Pippin, SLP PPS Coordinator present : Tora Duck, RN, CRRN;Becky Henrene Dodge, PT     Current Status/Progress Goal Weekly Team Focus  Medical   Pain from donor site. Wound care ongoing, severe mixed aphasia  Maintain medical stability during rehabilitation stay  Continue to work on Manufacturing systems engineer, continuing care for surgery side as well as donor site   Bowel/Bladder   cont of bowel and bladder; increased frequency  wears peri-pad; uses toilet  remain cont of bowel and bladder  cont use of toilet; monitor for constipation/diarrhea   Swallow/Nutrition/ Hydration   Dys. 3, thin liquids  Mod I with least restrictive PO  regular trials   ADL's   supervision with bathing, dressing, toileting using AE (except for right shoe due to swelling), supervision toilet transfers  mod I overall with basic self care of sponge bath, dressing, toileting, toilet transfers  ADL retraining, functional mobility training, activity tolerance, Pt/ family education   Mobility   S/Min A gait with RW and transfers  S gait, Mod I transfers and bed mobility, Mod I WC  Static and dynamic balance, gait in varying environments, transfers   Communication   Mod-Max cues with verbal expression  Min cues  increase verbal expression, word-finding strategies   Safety/Cognition/ Behavioral Observations  Mod cues  Min with external aids for memory  utilization of external memory aids   Pain   increased L Leg pain (surgical leg) for the past 3 days, around level 8, PRN tylenol 650mg  and tramadol 50mg   pain controlled less than 4  assess pain and medicate as needed   Skin   sutures on L groin along abdominal fold, reddened, dry dressing; L lower leg graft site with mepitel and hydrogel; L post thigh donor site with xeroform and dry dsg; redness around perineal area  graft/donor sites heal without infection; no new skin breakdown  change dressings daily as ordered; assess skin qshift and PRN      *See Care Plan and progress notes for long and short-term goals.  Barriers to Discharge: Will require 24 hour supervision at discharge    Possible Resolutions to Barriers:  Training  family members    Discharge Planning/Teaching Needs:  Plans to go back to Assisting Living upon discharge.  Daughter checks on daily      Team Discussion:  Making good progress-pain of donor site limits, along with neck pain.  Biopsy clean borders.  Levels flucuates  between supervision/mod/i levels.  Needs to be more mod/i to return to ALF  Revisions to Treatment Plan:  None   Continued Need for Acute Rehabilitation Level of Care: The patient requires daily medical management by a physician with specialized training in physical medicine and rehabilitation for the following conditions: Daily direction of a multidisciplinary physical rehabilitation program to ensure safe treatment while eliciting the highest outcome that is of practical value to the patient.: Yes Daily medical management of patient stability for increased activity during participation in an intensive rehabilitation regime.: Yes Daily analysis of laboratory values and/or radiology reports with any subsequent need for medication adjustment of medical intervention for : Neurological problems  [name], Lemar Livings 05/29/2013, 1:23 PM

## 2013-05-29 NOTE — IPOC Note (Signed)
Overall Plan of Care [facility]) Patient Details Name: Melanie Wood MRN: [id number] DOB: 1930/06/05  Admitting Diagnosis: LT CVA  Hospital Problems: Principal Problem:   CVA (cerebral vascular accident)-bicerebral/bicerebellar embolic infarcts--largest left posterior frontal Active Problems:   Aphasia   Atrial fibrillation   Melanoma left shin--s/p excision with skin graft     Functional Problem List: Nursing Edema;Bladder;Endurance;Medication Management;Pain;Safety;Sensory;Skin Integrity  PT Balance;Endurance;Motor;Pain;Sensory  OT Balance;Endurance;Pain  SLP Cognition;Linguistic  TR         Basic ADL's: OT Bathing;Dressing;Toileting     Advanced  ADL's: OT       Transfers: PT Bed Mobility;Bed to Chair;Car;Furniture  OT Toilet;Tub/Shower     Locomotion: PT Ambulation;Wheelchair Mobility     Additional Impairments: OT None  SLP Swallowing;Communication;Social Cognition expression Memory;Attention  TR      Anticipated Outcomes Item Anticipated Outcome  Self Feeding I  Swallowing  Mod I with least restrictive PO   Basic self-care  mod I dressing and sponge bath, supervision with shower transfer and shower (to cover wound)  Toileting  mod I   Bathroom Transfers Mod I  Bowel/Bladder  manage bowel and bladder with min assist   Transfers  Mod I   Locomotion  Mod I   Communication  Min with multimodal communication  Cognition  Min  Pain  min assist   Safety/Judgment  min assist    Therapy Plan: PT Intensity: Minimum of 1-2 x/day ,45 to 90 minutes PT Frequency: 5 out of 7 days PT Duration Estimated Length of Stay: 7-10 days OT Intensity: Minimum of 1-2 x/day, 45 to 90 minutes OT Frequency: 5 out of 7 days OT Duration/Estimated Length of Stay: 1 week SLP Intensity: Minumum of 1-2 x/day, 30 to 90 minutes SLP Frequency: 5 out of 7 days SLP Duration/Estimated Length of Stay: 7-10 days       Team Interventions: Nursing Interventions Patient/Family  Education;Disease Management/Prevention;Pain Management;Medication Management;Skin Care/Wound Management;Cognitive Remediation/Compensation;Dysphagia/Aspiration Precaution Training;Discharge Planning;Psychosocial Support  PT interventions Ambulation/gait training;Balance/vestibular training;Discharge planning;Disease Lexicographer;Functional mobility training;Neuromuscular re-education;Pain management;Patient/family education;Psychosocial support;Skin care/wound management;Therapeutic Activities;Therapeutic Exercise;UE/LE Strength taining/ROM;UE/LE Coordination activities;Wheelchair propulsion/positioning;Visual/perceptual remediation/compensation  OT Interventions Financial controller;Functional mobility training;Pain management;Patient/family education;Self Care/advanced ADL retraining;Therapeutic Activities;Therapeutic Exercise;UE/LE Strength taining/ROM  SLP Interventions Cognitive remediation/compensation;Cueing hierarchy;Dysphagia/aspiration precaution training;Environmental controls;Functional tasks;Internal/external aids;Multimodal communication approach;Patient/family education;Speech/Language facilitation  TR Interventions    SW/CM Interventions Discharge Planning;Psychosocial Support;Patient/Family Education    Team Discharge Planning: Destination: PT-Assisted Living ,OT- Assisted Living , SLP-Assisted Living Projected Follow-up: PT-Home health PT;24 hour supervision/assistance, OT-  None, SLP-Home Health SLP;24 hour supervision/assistance Projected Equipment Needs: PT-Wheelchair (measurements);Wheelchair cushion (measurements), OT-  , SLP-None recommended by SLP Patient/family involved in discharge planning: PT- Patient;Family member/caregiver,  OT-Patient;Family member/caregiver, SLP-Patient;Family member/caregiver  MD ELOS: 2 wks Medical Rehab Prognosis:  Good Assessment: 79 y.o.  RH-female with history of A fib, HTN, who was taken off Xarelto to have melanoma removed. On 05/17/13, she was admitted with slurred speech with expressive difficulties, right facial droop and RUE weakness. MRI/MRA brain showed acute bilateral cerebral infarcts in bilateral frontal lobes (-largest in left posterior), Bilateral occipital and rigth thalamus; no stenosis. 2D echo with EF 55-60% and no wall abnormality. Neurology consulted and recommended resuming Xarelto for embolic stroke due to A Fib.   Now requiring 24/7 Rehab RN,MD, as well as CIR level PT, OT and SLP.  Treatment team will focus on ADLs and mobility with goals set at Mod I, cognitively will need sup   See Team Conference Notes for weekly updates to the  plan of care

## 2013-05-29 NOTE — Progress Notes (Signed)
Occupational Therapy Session Note  Patient Details  Name: Melanie Wood MRN: [id number] Date of Birth: November 01, 1929  Today's Date: 05/29/2013 Time: 1100-1200 Time Calculation (min): 60 min  Short Term Goals: Week 1:  OT Short Term Goal 1 (Week 1): STG = LTGs due to short LOS  Skilled Therapeutic Interventions/Progress Updates:    pt sitting in recliner upon arrival. Pt declined bath because pt was already dressed. Pt ambulated with RW from room to rehab apartment. Pt engaged in task of making bed. Pt applied fitted sheet, flat sheet, comforter, and pillows to bed. All items placed on chair rather than floor. Pt used RW to get around bed to apply sheets, and at times used bed as support to walk. Pt became more fatigue with using bed as support, so pt used RW more to assist. Pt completed activity safely, with one rest break in-between. Pt instructed to bring walker all the way around when sitting down, pt currently bring walker into a slanted position and sits slanted, needed verbal cues to turn all the way around. Pt completed 3/3 toiletting tasks with supervision. Again pt reminded to turn her body and walker all the way straight before sitting. Pt ambulated from rehab apartment to rehab gym with supervision. Pt engaged in activity of picking up small hulu hoops using rolling walker and reacher. Hulu hoops were placed throughout entire gym.  Pt completed activty, only taking rest at end of activity before going back to room. Focus of session home management, activity tolerance, dynamic standing balance, and transfers.   Therapy Documentation Precautions:  Precautions Precautions: Fall;Other (comment) (Wound on L lateral thigh at graft, L calf melanoma removal) Precaution Comments: JP drain L groin, lymph nodes removed Restrictions Weight Bearing Restrictions: No  Pain Assessment Pain Assessment: 0-10 Pain Score:  (no score reported) Pain Type: Acute pain Pain Location: Leg (as well as  shoulders) Pain Orientation: Right (in shoulders right and left) Pain Descriptors / Indicators: Aching Pain Frequency: Constant Pain Onset: With Activity Patients Stated Pain Goal:  Pain Intervention(s): Medication (See eMAR)  See FIM for current functional status  Therapy/Group: Individual Therapy  Olivia Mackie 05/29/2013, 12:08 PM

## 2013-05-29 NOTE — Plan of Care (Signed)
Problem: RH PAIN MANAGEMENT Goal: RH STG PAIN MANAGED AT OR BELOW PT'S PAIN GOAL Pain level <4.  Outcome: Not Progressing Patient complaining of progressive pain on L leg around pain level 8. Pain with movement and difficulty getting comfortable in the bed.

## 2013-05-29 NOTE — Progress Notes (Signed)
Note reviewed and accurately reflects treatment session.   

## 2013-05-29 NOTE — Progress Notes (Signed)
Social Work Patient ID: Melanie Wood, female   DOB: 06-Jul-1930, 79 y.o.   MRN: [id number] Met with pt, husband and daughter-Teresa to inform team conference goals-supervision/mod/i level and discharge 10/4.   Daughter wants to make sure ready functional.  Caroline-PT feels to get to pt mod/i level would be better to have until Monday. Daughter reports she could have sister stay all day with pt Monday for the transition, she has to work on Sat and this in fact works better for them. Spoke with MD and he feels Monday discharge is fine.  Team has still more to work on with pt.  Aware discharge changed to Monday, will contact ALF to See what paperwork they need.

## 2013-05-29 NOTE — Progress Notes (Signed)
NUTRITION FOLLOW UP  Intervention:   1.  Supplements; continue Ensure Complete po BID, each supplement provides 350 kcal and 13 grams of protein.  Nutrition Dx:   Inadequate oral intake, improving  Goal:  Pt to meet >/= 90% of their estimated nutrition needs.  Improving, likely met.   Monitor:  PO intake, supplement acceptance, weight trend, labs   Assessment:   Pt recently had a skin graft for melanoma while off her Xarelto pt had a stroke. Pt now admitted for rehab. Pt reportedly has been able to find foods she likes and is consuming 75-85% of Regular meals. She is drinking Ensure Complete 1-2 times daily.  RD visited room- pt unavailable at time of visit.  RD noted several snacks (candy, crackers, juice) at bedside as well as fast food seasoning packets.   RD to follow.  Height: Ht Readings from Last 1 Encounters:  05/23/13 5\' 2"  (1.575 m)    Weight Status:   Wt Readings from Last 1 Encounters:  05/23/13 179 lb 1.6 oz (81.239 kg)    Re-estimated needs:  Kcal: 1600-1800 Protein: 80-90g Fluid: >1.6 L/day  Skin: skin graft left leg  Diet Order: Dysphagia 3, thin   Intake/Output Summary (Last 24 hours) at 05/29/13 1201 Last data filed at 05/29/13 0800  Gross per 24 hour  Intake    480 ml  Output      3 ml  Net    477 ml    Last BM: 9/29   Labs:   Recent Labs Lab 05/24/13 0730  NA 142  K 3.6  CL 105  CO2 26  BUN 5*  CREATININE 0.41*  CALCIUM 8.4  GLUCOSE 143*    CBG (last 3)  No results found for this basename: GLUCAP,  in the last 72 hours  Scheduled Meds: . antiseptic oral rinse  15 mL Mouth Rinse BID  . atorvastatin  40 mg Oral q1800  . feeding supplement  237 mL Oral BID BM  . guaiFENesin  600 mg Oral BID  . lisinopril  1.25 mg Oral Daily  . mometasone-formoterol  2 puff Inhalation BID  . pantoprazole  40 mg Oral QHS  . rivaroxaban  15 mg Oral Q supper  . senna-docusate  2 tablet Oral QHS  . tiotropium  18 mcg Inhalation Daily     Continuous Infusions:   Loyce Dys, MS RD LDN Clinical Inpatient Dietitian Pager: [phone] Weekend/After hours pager: [phone]

## 2013-05-29 NOTE — Progress Notes (Signed)
Speech Language Pathology Daily Session Note  Patient Details  Name: Melanie Wood MRN: [id number] Date of Birth: February 17, 1930  Today's Date: 05/29/2013 Time: 0830-0930 Time Calculation (min): 60 min  Short Term Goals: Week 1: SLP Short Term Goal 1 (Week 1): Pt will communicate her basic wants/needs using multimodal communication with Mod cues. SLP Short Term Goal 2 (Week 1): Pt will utilize external memory aids with Mod cues. SLP Short Term Goal 3 (Week 1): Pt will consume dys. 3 textures and thin liquids with minimal overt s/s of aspiration. SLP Short Term Goal 4 (Week 1): Pt will demonstrate efficient mastication with trials of regular textures.  Skilled Therapeutic Interventions: Treatment focused on cognitive-linguistic goals. SLP facilitated session with Mod cues to facilitate verbal expression through toileting and dressing. Pt verbalized her need to use the bathroom and her desire to get dressed with Mod I, however had difficulty verbalizing more specific wants/needs. Pt participated in sentence generating task with Mod cues from SLP. Attempted melodic intonation to facilitate increased verbal expression without improvement noted at the sentence/phrase level. Continue plan of care.   FIM:  Comprehension Comprehension Mode: Auditory Comprehension: 5-Follows basic conversation/direction: With extra time/assistive device Expression Expression Mode: Verbal Expression: 3-Expresses basic 50 - 74% of the time/requires cueing 25 - 50% of the time. Needs to repeat parts of sentences. Social Interaction Social Interaction: 5-Interacts appropriately 90% of the time - Needs monitoring or encouragement for participation or interaction. Problem Solving Problem Solving: 4-Solves basic 75 - 89% of the time/requires cueing 10 - 24% of the time Memory Memory: 4-Recognizes or recalls 75 - 89% of the time/requires cueing 10 - 24% of the time  Pain Pain Assessment Pain Assessment: 0-10 Pain  Score:  (no score reported) Pain Type: Acute pain Pain Location: Leg (as well as shoulders) Pain Orientation: Right (in shoulders right and left) Pain Descriptors / Indicators: Aching Pain Onset: With Activity  Therapy/Group: Individual Therapy  Maxcine Ham 05/29/2013, 4:02 PM  Maxcine Ham, M.A. CCC-SLP [phone]

## 2013-05-29 NOTE — Progress Notes (Signed)
Physical Therapy Note  Patient Details  Name: Melanie Wood MRN: [id number] Date of Birth: December 20, 1929 Today's Date: 05/29/2013  1350-1500  70  Min, individual therapy  tx focused on neuromuscular re-education via forced use, tactile cues and demo for Biodex  (postural stability, wt shift, and limits of stability) , standing on compliant Airex mat,  for dynamic standing balance, facilitating balance strategies, functional reaching in standing, Fall Prevention I exs in //.  Gait with RW x 150' x 2 and through obstacle course of cones and canes on floor, with supervision in controlled env.       Pt has active hip strategy for LOB backwards, but ankle and stepping strategies are absent. Pt has excessive wt on bil heels during static standing, with continual LOB.   Toilet transfer with supervision, safely.   Pt transferred to bed with supervision, min assist for RLE sit> supine.  LTGs upgraded to modified independent overall except for car transfer.  [name] 05/29/2013, 2:18 PM

## 2013-05-29 NOTE — Plan of Care (Signed)
Problem: RH PAIN MANAGEMENT Goal: RH STG PAIN MANAGED AT OR BELOW PT'S PAIN GOAL Pain level <4.  Outcome: Progressing With pain med

## 2013-05-29 NOTE — Progress Notes (Addendum)
Patient ID: Melanie Wood, female   DOB: 08-14-1930, 79 y.o.   MRN: [id number] Subjective/Complaints: [name] is a 79 y.o. RH-female with history of A fib, HTN, who was taken off Xarelto to have melanoma removed. On 05/17/13, she was admitted with slurred speech with expressive difficulties, right facial droop and RUE weakness. MRI/MRA brain showed acute bilateral cerebral infarcts in bilateral frontal lobes (-largest in left posterior), Bilateral occipital and rigth thalamus; no stenosis. 2D echo with EF 55-60% and no wall abnormality. Neurology consulted and recommended resuming Xarelto for embolic stroke due to A Fib. CCS consulted for input on VAC and dressing changed yesterday. Biopsy results per family received from surgeon, clean borders  Slept ok Review of Systems - unable to obtain secondary to aphasia Objective: Vital Signs: Blood pressure 133/81, pulse 80, temperature 97.8 F (36.6 C), temperature source Oral, resp. rate 18, height 5\' 2"  (1.575 m), weight 81.239 kg (179 lb 1.6 oz), SpO2 96.00%. No results found. No results found for this or any previous visit (from the past 72 hour(s)).   HEENT: normal Cardio: irregular Resp: Wheezes and unlabored GI: BS positive and non distended Extremity:  No Edema Skin:   Wound Left lateral thigh STSG donor site and Other Left lateral calf full thickness excision Neuro: Cranial Nerve II-XII normal, Abnormal Sensory cannot assess secondary to aphasia, Normal Motor, Abnormal FMC Ataxic RUE/ dec FMC, Dysarthric and Aphasic Musc/Skel:  Normal Gen NAD   Assessment/Plan: 1. Functional deficits secondary to Left posterior frontal embolic infarct which require 3+ hours per day of interdisciplinary therapy in a comprehensive inpatient rehab setting. Physiatrist is providing close team supervision and 24 hour management of active medical problems listed below. Physiatrist and rehab team continue to assess barriers to discharge/monitor patient  progress toward functional and medical goals. Team conference today please see physician documentation under team conference tab, met with team face-to-face to discuss problems,progress, and goals. Formulized individual treatment plan based on medical history, underlying problem and comorbidities. FIM: FIM - Bathing Bathing Steps Patient Completed: Chest;Right Arm;Left Arm;Abdomen;Front perineal area;Buttocks;Right upper leg;Right lower leg (including foot);Left lower leg (including foot) Bathing: 5: Supervision: Safety issues/verbal cues  FIM - Upper Body Dressing/Undressing Upper body dressing/undressing steps patient completed: Thread/unthread right bra strap;Thread/unthread left bra strap;Hook/unhook bra;Thread/unthread right sleeve of pullover shirt/dresss;Thread/unthread left sleeve of pullover shirt/dress;Put head through opening of pull over shirt/dress;Pull shirt over trunk Upper body dressing/undressing: 5: Set-up assist to: Obtain clothing/put away FIM - Lower Body Dressing/Undressing Lower body dressing/undressing steps patient completed: Thread/unthread right underwear leg;Thread/unthread left underwear leg;Pull underwear up/down;Thread/unthread right pants leg;Thread/unthread left pants leg;Don/Doff right sock;Don/Doff left sock;Don/Doff left shoe Lower body dressing/undressing: 4: Min-Patient completed 75 plus % of tasks  FIM - Toileting Toileting steps completed by patient: Adjust clothing prior to toileting;Performs perineal hygiene;Adjust clothing after toileting Toileting Assistive Devices: Grab bar or rail for support Toileting: 5: Supervision: Safety issues/verbal cues  FIM - Diplomatic Services operational officer Devices: Best boy Transfers: 5-To toilet/BSC: Supervision (verbal cues/safety issues);5-From toilet/BSC: Supervision (verbal cues/safety issues)  FIM - Press photographer Assistive Devices: Arm rests Bed/Chair Transfer: 4:  Bed > Chair or W/C: Min A (steadying Pt. > 75%)  FIM - Locomotion: Wheelchair Distance: 40 Locomotion: Wheelchair: 0: Activity did not occur FIM - Locomotion: Ambulation Locomotion: Ambulation Assistive Devices: Designer, industrial/product Ambulation/Gait Assistance: 5: Supervision Locomotion: Ambulation: 2: Travels 50 - 149 ft with supervision/safety issues  Comprehension Comprehension Mode: Auditory Comprehension: 5-Follows basic conversation/direction: With extra time/assistive device  Expression Expression Mode: Verbal Expression: 3-Expresses basic 50 - 74% of the time/requires cueing 25 - 50% of the time. Needs to repeat parts of sentences.  Social Interaction Social Interaction: 5-Interacts appropriately 90% of the time - Needs monitoring or encouragement for participation or interaction.  Problem Solving Problem Solving: 4-Solves basic 75 - 89% of the time/requires cueing 10 - 24% of the time  Memory Memory: 4-Recognizes or recalls 75 - 89% of the time/requires cueing 10 - 24% of the time   Medical Problem List and Plan:  1. DVT Prophylaxis/Anticoagulation: Pharmaceutical: Xarelto  2. Pain Management: Will add prn ultram.  3. Mood: LCSW to follow for evaluation.  4. Neuropsych: This patient is capable of making decisions on her own behalf.  5. HTN: Monitor with bid checks. Blood pressures labile---resume lisinopril  6. Melanoma left lateral calf: S/P excision Daily dressing change to left calf--to be kept moist with change of topper daily. Path pending will see if report is available, has melanoma, need to see if edges clean, f/u surgery post hospital,  7. A fib: Monitor heart rate with bid checks as may increase with activity. Is off coreg at this time.  8. Hypokalemia: Recheck in am. Will supplement briefly--dilutional due to IVF. Discontinue IVF.  9. 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  Wheezing no SOB monitor during therapy, has  inhalers LOS (Days) 6 A FACE TO FACE EVALUATION WAS PERFORMED  KIRSTEINS,ANDREW E 05/29/2013, 8:32 AM

## 2013-05-30 ENCOUNTER — Inpatient Hospital Stay (HOSPITAL_COMMUNITY): Payer: Medicare Other | Admitting: Physical Therapy

## 2013-05-30 ENCOUNTER — Inpatient Hospital Stay (HOSPITAL_COMMUNITY): Payer: Medicare Other

## 2013-05-30 DIAGNOSIS — I6340 Cerebral infarction due to embolism of unspecified cerebral artery: Secondary | ICD-10-CM

## 2013-05-30 DIAGNOSIS — G8110 Spastic hemiplegia affecting unspecified side: Secondary | ICD-10-CM

## 2013-05-30 DIAGNOSIS — I69920 Aphasia following unspecified cerebrovascular disease: Secondary | ICD-10-CM

## 2013-05-30 NOTE — Progress Notes (Addendum)
Patient ID: Melanie Wood, female   DOB: [birth year], 79 y.o.   MRN: [id number] Subjective/Complaints: Melanie Wood is a 79 y.o. RH-female with history of A fib, HTN, who was taken off Xarelto to have melanoma removed. On 05/17/13, she was admitted with slurred speech with expressive difficulties, right facial droop and RUE weakness. MRI/MRA brain showed acute bilateral cerebral infarcts in bilateral frontal lobes (-largest in left posterior), Bilateral occipital and rigth thalamus; no stenosis. 2D echo with EF 55-60% and no wall abnormality. Neurology consulted and recommended resuming Xarelto for embolic stroke due to A Fib. CCS consulted for input on VAC and dressing changed yesterday.  Family issues regarding supervision Discussed timeframe of speech recovery Review of Systems - unable to obtain secondary to aphasia Objective: Vital Signs: Blood pressure 133/77, pulse 83, temperature 98.1 F (36.7 C), temperature source Oral, resp. rate 18, height 5\' 2"  (1.575 m), weight 81.239 kg (179 lb 1.6 oz), SpO2 96.00%. No results found. No results found for this or any previous visit (from the past 72 hour(s)).   HEENT: normal Cardio: irregular Resp: Wheezes and unlabored GI: BS positive and non distended Extremity:  No Edema Skin:   Wound Left lateral thigh STSG donor site and Other Left lateral calf full thickness excision Neuro: Cranial Nerve II-XII normal, Abnormal Sensory cannot assess secondary to aphasia, Normal Motor, Abnormal FMC Ataxic RUE/ dec FMC, Dysarthric and Aphasic Musc/Skel:  Normal Gen NAD   Assessment/Plan: 1. Functional deficits secondary to Left posterior frontal embolic infarct which require 3+ hours per day of interdisciplinary therapy in a comprehensive inpatient rehab setting. Physiatrist is providing close team supervision and 24 hour management of active medical problems listed below. Physiatrist and rehab team continue to assess barriers to discharge/monitor  patient progress toward functional and medical goals. Team conference today please see physician documentation under team conference tab, met with team face-to-face to discuss problems,progress, and goals. Formulized individual treatment plan based on medical history, underlying problem and comorbidities. FIM: FIM - Bathing Bathing Steps Patient Completed: Chest;Right Arm;Left Arm;Abdomen;Front perineal area;Buttocks;Right upper leg;Right lower leg (including foot);Left lower leg (including foot) Bathing: 5: Supervision: Safety issues/verbal cues  FIM - Upper Body Dressing/Undressing Upper body dressing/undressing steps patient completed: Thread/unthread right bra strap;Thread/unthread left bra strap;Hook/unhook bra;Thread/unthread right sleeve of pullover shirt/dresss;Thread/unthread left sleeve of pullover shirt/dress;Put head through opening of pull over shirt/dress;Pull shirt over trunk Upper body dressing/undressing: 5: Set-up assist to: Obtain clothing/put away FIM - Lower Body Dressing/Undressing Lower body dressing/undressing steps patient completed: Thread/unthread right underwear leg;Thread/unthread left underwear leg;Pull underwear up/down;Thread/unthread right pants leg;Thread/unthread left pants leg;Don/Doff right sock;Don/Doff left sock;Don/Doff left shoe Lower body dressing/undressing: 4: Min-Patient completed 75 plus % of tasks  FIM - Toileting Toileting steps completed by patient: Adjust clothing prior to toileting;Performs perineal hygiene;Adjust clothing after toileting Toileting Assistive Devices: Grab bar or rail for support Toileting: 5: Supervision: Safety issues/verbal cues  FIM - Diplomatic Services operational officer Devices: Best boy Transfers: 5-To toilet/BSC: Supervision (verbal cues/safety issues);5-From toilet/BSC: Supervision (verbal cues/safety issues)  FIM - Press photographer Assistive Devices: Arm rests Bed/Chair  Transfer: 4: Sit > Supine: Min A (steadying pt. > 75%/lift 1 leg);4: Chair or W/C > Bed: Min A (steadying Pt. > 75%)  FIM - Locomotion: Wheelchair Distance: 40 Locomotion: Wheelchair: 0: Activity did not occur FIM - Locomotion: Ambulation Locomotion: Ambulation Assistive Devices: Designer, industrial/product Ambulation/Gait Assistance: 5: Supervision Locomotion: Ambulation: 2: Travels 50 - 149 ft with supervision/safety issues  Comprehension Comprehension Mode:  Auditory Comprehension: 5-Follows basic conversation/direction: With extra time/assistive device  Expression Expression Mode: Verbal Expression: 3-Expresses basic 50 - 74% of the time/requires cueing 25 - 50% of the time. Needs to repeat parts of sentences.  Social Interaction Social Interaction: 5-Interacts appropriately 90% of the time - Needs monitoring or encouragement for participation or interaction.  Problem Solving Problem Solving: 4-Solves basic 75 - 89% of the time/requires cueing 10 - 24% of the time  Memory Memory: 5-Recognizes or recalls 90% of the time/requires cueing < 10% of the time   Medical Problem List and Plan:  1. DVT Prophylaxis/Anticoagulation: Pharmaceutical: Xarelto  2. Pain Management: Will add prn ultram.  3. Mood: LCSW to follow for evaluation.  4. Neuropsych: This patient is capable of making decisions on her own behalf.  5. HTN: Monitor with bid checks. Blood pressures labile---resume lisinopril  6. Melanoma left lateral calf: S/P excision Daily dressing change to left calf--to be kept moist with change of topper daily. Path pending will see if report is available, has melanoma, need to see if edges clean, f/u surgery post hospital,  7. A fib: Monitor heart rate with bid checks as may increase with activity. Is off coreg at this time.  8. Hypokalemia: Recheck in am. Will supplement briefly--dilutional due to IVF. Discontinue IVF.  9. 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  Wheezing no SOB monitor during therapy, has inhalers LOS (Days) 7 A FACE TO FACE EVALUATION WAS PERFORMED  Andreus Cure E 05/30/2013, 7:50 AM

## 2013-05-30 NOTE — Progress Notes (Signed)
Speech Language Pathology Daily Session Note  Patient Details  Name: Melanie Wood MRN: [id number] Date of Birth: Sep 21, 1929  Today's Date: 05/30/2013 Time: 1478-2956 Time Calculation (min): 45 min  Short Term Goals: Week 1: SLP Short Term Goal 1 (Week 1): Pt will communicate her basic wants/needs using multimodal communication with Mod cues. SLP Short Term Goal 2 (Week 1): Pt will utilize external memory aids with Mod cues. SLP Short Term Goal 3 (Week 1): Pt will consume dys. 3 textures and thin liquids with minimal overt s/s of aspiration. SLP Short Term Goal 4 (Week 1): Pt will demonstrate efficient mastication with trials of regular textures.  Skilled Therapeutic Interventions: Treatment focused on cognitive-linguistic and swallowing goals. SLP facilitated session with skilled observation of thin liquid trials via cup sips, which pt consumed with Mod I. No overt s/s of aspiration observed. SLP also provided Mod cues to identify personal wants/needs to add to her communication board. A sign was placed at the [facility] to identify communication strategies for staff/visitors. Pt participated in linguistic task, requiring Mod cues for sentence generation. Continue to recommend full supervision for this patient upon discharge home, given Moderate assistance needed for communication.   FIM:  Comprehension Comprehension Mode: Auditory Comprehension: 5-Follows basic conversation/direction: With extra time/assistive device Expression Expression Mode: Verbal Expression: 3-Expresses basic 50 - 74% of the time/requires cueing 25 - 50% of the time. Needs to repeat parts of sentences. Social Interaction Social Interaction: 5-Interacts appropriately 90% of the time - Needs monitoring or encouragement for participation or interaction. Problem Solving Problem Solving: 4-Solves basic 75 - 89% of the time/requires cueing 10 - 24% of the time Memory Memory: 4-Recognizes or recalls 75 - 89% of the  time/requires cueing 10 - 24% of the time FIM - Eating Eating Activity: 5: Supervision/cues  Pain Pain Assessment Pain Assessment: No/denies pain  Therapy/Group: Individual Therapy  Maxcine Ham 05/30/2013, 11:27 AM  Maxcine Ham, M.A. CCC-SLP [phone]

## 2013-05-30 NOTE — Progress Notes (Signed)
Physical Therapy Session Note  Patient Details  Name: Melanie Wood MRN: [id number] Date of Birth: [birth year]  Today's Date: 05/30/2013 Time: 1005-1105 Time Calculation (min): 60 min  Short Term Goals: Week 1:  PT Short Term Goal 1 (Week 1): STG=LTG due to short LOS  Skilled Therapeutic Interventions/Progress Updates:    Pt received sitting in recliner from nsg. Pt performed gait in and around room and was able to stand at sink x 3 mins to complete teeth brushing and handwashing. Gait training with emphasis on improving step length and floor clearance x 125' x 2 with RW. Nu-Step on L 2 x 8 min with pt reporting 13 on Borg RPE scale and requiring 3 brief rests, pt was agreeable to complete. Standing exs B LEs in // bars without weight 2 x 10 reps, weight shifting and tandem standing in //bars. Pt reported slight fatigue after treatment session. Toilet transfer with supervision A with use of RW.   Therapy Documentation Precautions:  Precautions Precautions: Fall;Other (comment) (Wound on L lateral thigh at graft, L calf melanoma removal) Precaution Comments: JP drain L groin, lymph nodes removed Restrictions Weight Bearing Restrictions: No       Pain: Pain Assessment Pain Assessment: 5/10 L LE    Locomotion : Ambulation Ambulation/Gait Assistance: 5: Supervision                See FIM for current functional status  Therapy/Group: Individual Therapy  Jackelyn Knife 05/30/2013, 12:01 PM

## 2013-05-30 NOTE — Progress Notes (Signed)
Physical Therapy Note  Patient Details  Name: Melanie Wood MRN: [id number] Date of Birth: [birth year] Today's Date: 05/30/2013  Time: 1330-1400 30 minutes  1:1  Gait in controlled environment with RW close supervision, pt c/o L LE pain, RN made aware.  Standing balance activities ball bounce, pass, reaching and bending activities with min A for balance without UE support due to decreased ankle and hip strategies.  Pt required frequent rest breaks due to L LE pain, eased with rest.   Etheleen Valtierra 05/30/2013, 2:04 PM

## 2013-05-30 NOTE — Progress Notes (Signed)
Occupational Therapy Note  Patient Details  Name: Melanie Wood MRN: [id number] Date of Birth: 1930-01-13 Today's Date: 05/30/2013  Time: 1100-1200 Pt c/o 5/10 aching in LLE; RN aware and repositioned Individual Therapy   Pt resting in recliner upon arrival and agreeable to amb with RW to therapy gym.  Pt engaged in functional amb with RW to retrieve and gather items from variety of surface heights.  Pt used reacher appropriately to gather items from floor.  Pt also engaged in standing activities with focus on dynamic standing balance.  Pt c/o increased pain in LLE with prolonged standing/ambulation.  Pt fatigues easily and requires multiple rest breaks throughout session.  Melanie Wood [facility] 05/30/2013, 12:07 PM

## 2013-05-31 ENCOUNTER — Inpatient Hospital Stay (HOSPITAL_COMMUNITY): Payer: Medicare Other | Admitting: Occupational Therapy

## 2013-05-31 ENCOUNTER — Inpatient Hospital Stay (HOSPITAL_COMMUNITY): Payer: Medicare Other | Admitting: *Deleted

## 2013-05-31 ENCOUNTER — Inpatient Hospital Stay (HOSPITAL_COMMUNITY): Payer: Medicare Other

## 2013-05-31 MED ORDER — SENNOSIDES-DOCUSATE SODIUM 8.6-50 MG PO TABS
2.0000 | ORAL_TABLET | Freq: Two times a day (BID) | ORAL | Status: DC
  Administered 2013-05-31 – 2013-06-03 (×6): 2 via ORAL
  Filled 2013-05-31 (×8): qty 2

## 2013-05-31 NOTE — Progress Notes (Addendum)
Speech Language Pathology Weekly Progress & Daily Session Notes  Patient Details  Name: Melanie Wood MRN: [id number] Date of Birth: Jul 15, 1930  Today's Date: 05/31/2013 Time: 0915-1000 Time Calculation (min): 45 min  Short Term Goals: Week 1: SLP Short Term Goal 1 (Week 1): Pt will communicate her basic wants/needs using multimodal communication with Mod cues. SLP Short Term Goal 1 - Progress (Week 1): Met SLP Short Term Goal 2 (Week 1): Pt will utilize external memory aids with Mod cues. SLP Short Term Goal 2 - Progress (Week 1): Met SLP Short Term Goal 3 (Week 1): Pt will consume dys. 3 textures and thin liquids with minimal overt s/s of aspiration. SLP Short Term Goal 3 - Progress (Week 1): Met SLP Short Term Goal 4 (Week 1): Pt will demonstrate efficient mastication with trials of regular textures. SLP Short Term Goal 4 - Progress (Week 1): Not met  New Short Term Goals: Week 2: SLP Short Term Goal 1 (Week 2): Pt will communicate her basic wants/needs using multimodal communication with Min cues. SLP Short Term Goal 2 (Week 2): Pt will demonstrate efficient mastication with trials of regular textures. SLP Short Term Goal 3 (Week 2): Pt will generate speech at the sentence level with Mod cues.  Weekly Progress Updates: Pt has made functional gains in speech therapy, achieving 3/4 STGs during this reporting period. Pt appears to be tolerating Dys. 3 textures and thin liquids. She is at an overall Mod level for communication, and continues to have a low frustration tolerance. Increased wait time, gestures, yes/no questions, and a communication board facilitate conversation. Pt/family education remains ongoing. Pt will benefit from continued SLP intervention to maximize functional communication prior to discharge. Per discussion with SW, continue to recommend supervision for pt upon discharge as she is at a Mod A level for communication and this SLP has concerns regarding her ability to  communicate her wants/needs.   SLP Intensity: Minumum of 1-2 x/day, 30 to 90 minutes SLP Frequency: 5 out of 7 days SLP Duration/Estimated Length of Stay: 7-10 days SLP Treatment/Interventions: Cognitive remediation/compensation;Cueing hierarchy;Dysphagia/aspiration precaution training;Environmental controls;Functional tasks;Internal/external aids;Multimodal communication approach;Patient/family education;Speech/Language facilitation  Daily Session Skilled Therapeutic Intervention: Treatment focused on cognitive-linguistic skills. SLP facilitated session with questions regarding daily events, with pt utilizing schedule as an external aid with Mod I. Pt participated in sentence generation task, with Mod cues to produce sentences that were 5-10 words in length. Pt more conversant today, with spontaneous speech primarily at the word to phrase level.   FIM:  Comprehension Comprehension Mode: Auditory Comprehension: 5-Follows basic conversation/direction: With no assist Expression Expression Mode: Verbal Expression: 3-Expresses basic 50 - 74% of the time/requires cueing 25 - 50% of the time. Needs to repeat parts of sentences. Social Interaction Social Interaction: 5-Interacts appropriately 90% of the time - Needs monitoring or encouragement for participation or interaction. Problem Solving Problem Solving: 5-Solves basic 90% of the time/requires cueing < 10% of the time Memory Memory: 4-Recognizes or recalls 75 - 89% of the time/requires cueing 10 - 24% of the time General    Pain Pain Assessment Pain Score: 5  Faces Pain Scale: Hurts even more Pain Type: Acute pain Pain Location: Neck Pain Orientation: Left Pain Descriptors / Indicators: Aching Pain Onset: With Activity (pain stops at rest) Pain Intervention(s): RN made aware  Therapy/Group: Individual Therapy  Maxcine Ham 05/31/2013, 11:52 AM  Maxcine Ham, M.A. CCC-SLP [phone]

## 2013-05-31 NOTE — Consult Note (Signed)
Wound care request: Called by staff nurse to order Interdry silver impregnated fabric.  She describes that pt has red macerated skin between abd and breast folds and PA would like to order this topical treatment.  Orders entered and instructions provided for staff nurse use. Please re-consult if further assistance is needed.  Thank-you,  Cammie Mcgee MSN, RN, CWOCN, [place], CNS [phone]

## 2013-05-31 NOTE — Progress Notes (Signed)
Occupational Therapy Session Note  Patient Details  Name: Melanie Wood MRN: [id number] Date of Birth: 11-26-1929  Today's Date: 05/31/2013 Time: 0820-0905 Time Calculation (min): 45 min - missed 15  Min due to refusal as pt wanted to finish breakfast and stated she was feeling rushed  Short Term Goals: No short term goals set  Skilled Therapeutic Interventions/Progress Updates:    Pt missed first 20 minutes of therapy as she had not finished her breakfast due to many morning interruptions.  She was upset that this clinician had come in the room and yelled "go away".  I offered to start her ADL session in 20 minutes. Her ADL session began with ambulation to toilet with supervision. She toileted with distant supervision. She completed B/D at sink with set up, supervision and use of AE. She was frequently frustrated when she was having difficulty using AE.  She expressed she was in pain in LLE everytime she stood or took steps.  Pt completed grooming standing at the sink.  She continues to need assist to don left shoe due to edema.   At end of session, pt was calm and resting in her recliner with phone and call light in reach.   Therapy Documentation Precautions:  Precautions Precautions: Fall;Other (comment) (Wound on L lateral thigh at graft, L calf melanoma removal) Precaution Comments: JP drain L groin, lymph nodes removed Restrictions Weight Bearing Restrictions: No  General Amount of Missed OT Time (min): 15 Minutes Vital Signs: Therapy Vitals Temp: 98.3 F (36.8 C) Temp src: Oral Pulse Rate: 90 Resp: 18 BP: 153/79 mmHg Patient Position, if appropriate: Lying Oxygen Therapy SpO2: 92 % O2 Device: None (Room air) Pain: Pain Assessment Faces Pain Scale: Hurts even more Pain Type: Acute pain Pain Location: Leg Pain Orientation: Left Pain Descriptors / Indicators: Aching Pain Onset: With Activity (pain stops at rest) Pain Intervention(s): Rest ADL:  See FIM for  current functional status  Therapy/Group: Individual Therapy  Kalina Morabito 05/31/2013, 9:25 AM

## 2013-05-31 NOTE — Progress Notes (Signed)
Physical Therapy Weekly Progress Note  Patient Details  Name: Melanie Wood MRN: [id number] Date of Birth: 07-26-1930  Today's Date: 05/31/2013 Time: 1010-1057 and 14:50-15:35  Time Calculation (min): 47 min and   Patient has met 2 of 7 long term goals.  Short term goals not set due to estimated length of stay.  Pt needing distant supervision at this time for gait and mobility due to general unsteadiness, but pt well on her way to mod I goals. Pt is aware of her balance and safety needs and moves carefully in her environment.   Patient continues to demonstrate the following deficits: delayed righting reactions, generalized weakness, and pain and therefore will continue to benefit from skilled PT intervention to enhance overall performance with activity tolerance, balance and ability to compensate for deficits.  See Patient's Care Plan for progression toward long term goals.  Patient progressing toward long term goals..  Plan of care revisions: Adding communtiy gait goal so that pt may continue her regular visit to spouse in memory care of same facility. .  Skilled Therapeutic Interventions/Progress Updates:  1:2  Tx focused on HEP education for strengthening and balance, mobility in home setting, car transfer with daughter, and gait in controlled environment.   All transfers from furniture, bed and WC with S only for safety. Pt moves cautiously and carefully with safe hand placement. Periodic LLE pain, relieved by change in position/activity.   Gait in controlled environment x100,' x160,' x50' with S only and cues to reduce UE weight-bearing for improved balance. Gait in home environment and bathroom x50' with S and safe turning techniques.  Bed mobility Mod I with increased time/effort required.   Instructed pt in [place] HEP for strengthening and balance. Pt provided handout.   Daughter arrived and instructed in car transfer with min A and no difficulties. No further questions at this  time.   2:2  Tx focused on community gait and mobility. Gait training in community setting with RW and S x > 150.' Pt able to navigate busy environments, varying terrains, obstacles, and tight spaces with no LOB. Pt somewhat fatigued by end of tx, but glad to be off unit.  Performed mobility in/around her room to progress towards Mod I. Navigated toilet transfer, drawers in room, and all furniture with S>>Mod I. Pt continues to require increased time, and effort occasionally, for transfers and bed mobility, but is well-aware of her balance and needs.  Pt with no further questions or concerns at this time. Left up in recliner with all needs in reach.       Therapy Documentation Precautions:  Precautions Precautions: Fall;Other (comment) (Wound on L lateral thigh at graft, L calf melanoma removal) Precaution Comments: JP drain L groin, lymph nodes removed Restrictions Weight Bearing Restrictions: No  Pain: None  See FIM for current functional status  Therapy/Group: Individual Therapy  Clydene Laming, PT, DPT   05/31/2013, 11:10 AM

## 2013-05-31 NOTE — Progress Notes (Signed)
Patient ID: Melanie Wood, female   DOB: Jul 21, 1930, 79 y.o.   MRN: [id number] Subjective/Complaints: Melanie Wood is a 79 y.o. RH-female with history of A fib, HTN, who was taken off Xarelto to have melanoma removed. On 05/17/13, she was admitted with slurred speech with expressive difficulties, right facial droop and RUE weakness. MRI/MRA brain showed acute bilateral cerebral infarcts in bilateral frontal lobes (-largest in left posterior), Bilateral occipital and rigth thalamus; no stenosis. 2D echo with EF 55-60% and no wall abnormality. Neurology consulted and recommended resuming Xarelto for embolic stroke due to A Fib. CCS consulted for input on VAC and dressing changed yesterday.  No problems overnite except constipation.  No abd pain or nausea Responded to supp Review of Systems - unable to obtain secondary to aphasia Objective: Vital Signs: Blood pressure 153/79, pulse 90, temperature 98.3 F (36.8 C), temperature source Oral, resp. rate 18, height 5\' 2"  (1.575 m), weight 81.239 kg (179 lb 1.6 oz), SpO2 92.00%. No results found. No results found for this or any previous visit (from the past 72 hour(s)).   HEENT: normal Cardio: irregular Resp: Wheezes and unlabored GI: BS positive and non distended Extremity:  No Edema Skin:   Wound Left lateral thigh STSG donor site and Other Left lateral calf full thickness excision Neuro: Cranial Nerve II-XII normal, Abnormal Sensory cannot assess secondary to aphasia, Normal Motor, Abnormal FMC Ataxic RUE/ dec FMC, Dysarthric and Aphasic Musc/Skel:  Normal Gen NAD   Assessment/Plan: 1. Functional deficits secondary to Left posterior frontal embolic infarct which require 3+ hours per day of interdisciplinary therapy in a comprehensive inpatient rehab setting. Physiatrist is providing close team supervision and 24 hour management of active medical problems listed below. Physiatrist and rehab team continue to assess barriers to  discharge/monitor patient progress toward functional and medical goals.  FIM: FIM - Bathing Bathing Steps Patient Completed: Chest;Right Arm;Left Arm;Abdomen;Front perineal area;Buttocks;Right upper leg;Right lower leg (including foot);Left lower leg (including foot) Bathing: 5: Supervision: Safety issues/verbal cues  FIM - Upper Body Dressing/Undressing Upper body dressing/undressing steps patient completed: Thread/unthread right bra strap;Thread/unthread left bra strap;Hook/unhook bra;Thread/unthread right sleeve of pullover shirt/dresss;Thread/unthread left sleeve of pullover shirt/dress;Put head through opening of pull over shirt/dress;Pull shirt over trunk Upper body dressing/undressing: 5: Set-up assist to: Obtain clothing/put away FIM - Lower Body Dressing/Undressing Lower body dressing/undressing steps patient completed: Thread/unthread right underwear leg;Thread/unthread left underwear leg;Pull underwear up/down;Thread/unthread right pants leg;Thread/unthread left pants leg;Don/Doff right sock;Don/Doff left sock;Don/Doff left shoe Lower body dressing/undressing: 4: Min-Patient completed 75 plus % of tasks  FIM - Toileting Toileting steps completed by patient: Adjust clothing prior to toileting;Performs perineal hygiene;Adjust clothing after toileting Toileting Assistive Devices: Grab bar or rail for support Toileting: 5: Supervision: Safety issues/verbal cues  FIM - Diplomatic Services operational officer Devices: Grab bars;Walker Toilet Transfers: 5-To toilet/BSC: Supervision (verbal cues/safety issues);5-From toilet/BSC: Supervision (verbal cues/safety issues)  FIM - Press photographer Assistive Devices: Arm rests Bed/Chair Transfer: 4: Bed > Chair or W/C: Min A (steadying Pt. > 75%);4: Chair or W/C > Bed: Min A (steadying Pt. > 75%)  FIM - Locomotion: Wheelchair Distance: 40 Locomotion: Wheelchair: 0: Activity did not occur FIM - Locomotion:  Ambulation Locomotion: Ambulation Assistive Devices: Designer, industrial/product Ambulation/Gait Assistance: 5: Supervision Locomotion: Ambulation: 2: Travels 50 - 149 ft with supervision/safety issues  Comprehension Comprehension Mode: Auditory Comprehension: 5-Follows basic conversation/direction: With no assist  Expression Expression Mode: Verbal Expression: 3-Expresses basic 50 - 74% of the time/requires cueing 25 - 50%  of the time. Needs to repeat parts of sentences.  Social Interaction Social Interaction: 5-Interacts appropriately 90% of the time - Needs monitoring or encouragement for participation or interaction.  Problem Solving Problem Solving: 4-Solves basic 75 - 89% of the time/requires cueing 10 - 24% of the time  Memory Memory: 4-Recognizes or recalls 75 - 89% of the time/requires cueing 10 - 24% of the time   Medical Problem List and Plan:  1. DVT Prophylaxis/Anticoagulation: Pharmaceutical: Xarelto  2. Pain Management: Will add prn ultram.  3. Mood: LCSW to follow for evaluation.  4. Neuropsych: This patient is capable of making decisions on her own behalf.  5. HTN: Monitor with bid checks. Blood pressures labile---resume lisinopril  6. Melanoma left lateral calf: S/P excision Daily dressing change to left calf--to be kept moist with change of topper daily. Path pending will see if report is available, has melanoma, need to see if edges clean, f/u surgery post hospital,  7. A fib: Monitor heart rate with bid checks as may increase with activity. Is off coreg at this time.  8. Hypokalemia: Recheck in am. Will supplement briefly--dilutional due to IVF. Discontinue IVF.  9. 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  Wheezing no SOB monitor during therapy, has inhalers LOS (Days) 8 A FACE TO FACE EVALUATION WAS PERFORMED  Melanie Wood E 05/31/2013, 7:59 AM

## 2013-06-01 ENCOUNTER — Inpatient Hospital Stay (HOSPITAL_COMMUNITY): Payer: Medicare Other

## 2013-06-01 ENCOUNTER — Inpatient Hospital Stay (HOSPITAL_COMMUNITY): Payer: Medicare Other | Admitting: Physical Therapy

## 2013-06-01 DIAGNOSIS — G8110 Spastic hemiplegia affecting unspecified side: Secondary | ICD-10-CM

## 2013-06-01 DIAGNOSIS — I6340 Cerebral infarction due to embolism of unspecified cerebral artery: Secondary | ICD-10-CM

## 2013-06-01 DIAGNOSIS — I69920 Aphasia following unspecified cerebrovascular disease: Secondary | ICD-10-CM

## 2013-06-01 MED ORDER — HYDROCODONE-ACETAMINOPHEN 5-325 MG PO TABS
1.0000 | ORAL_TABLET | Freq: Four times a day (QID) | ORAL | Status: DC | PRN
  Administered 2013-06-01 – 2013-06-03 (×6): 1 via ORAL
  Filled 2013-06-01 (×6): qty 1

## 2013-06-01 MED ORDER — CEFAZOLIN SODIUM 1-5 GM-% IV SOLN
1.0000 g | Freq: Three times a day (TID) | INTRAVENOUS | Status: DC
  Administered 2013-06-01 – 2013-06-02 (×3): 1 g via INTRAVENOUS
  Filled 2013-06-01 (×5): qty 50

## 2013-06-01 NOTE — Progress Notes (Signed)
Physical Therapy Session Note  Patient Details  Name: Melanie Wood MRN: [id number] Date of Birth: 01-23-1930  Today's Date: 06/01/2013 Time: 1478-2956 Time Calculation (min): 73 min  Short Term Goals: Week 1:  PT Short Term Goal 1 (Week 1): STG=LTG due to short LOS  Skilled Therapeutic Interventions/Progress Updates:  Pt ambulated 200 feet with rolling walker and S, slow steady cadence no LOB. Pt performed standing LE strengthening to include cone taps, mini squats and steps ups. Pt returned to room. Pt demonstrated the ability to transfer on and off the bed with or without siderail, ambulated in and out of the bathroom with rolling walker as well as to the sink with rolling walker and mod I. Pt LOB or safety issues noted. Sign placed in doorway and nursing notified, pt is now mod I with rolling walker in room.    Therapy Documentation Precautions:  Precautions Precautions: Fall;Other (comment) (Wound on L lateral thigh at graft, L calf melanoma removal) Precaution Comments: JP drain L groin, lymph nodes removed Restrictions Weight Bearing Restrictions: No General:  Pain: Pt c/o pain L LE.  Mobility:   Locomotion : Ambulation Ambulation/Gait Assistance: 5: Supervision   See FIM for current functional status  Therapy/Group: Individual Therapy  Rayford Halsted 06/01/2013, 3:51 PM

## 2013-06-01 NOTE — Progress Notes (Addendum)
Patient ID: Melanie Wood, female   DOB: [birth year], 79 y.o.   MRN: [id number] Subjective/Complaints: Melanie Wood is a 79 y.o. RH-female with history of A fib, HTN, who was taken off Xarelto to have melanoma removed. On 05/17/13, she was admitted with slurred speech with expressive difficulties, right facial droop and RUE weakness. MRI/MRA brain showed acute bilateral cerebral infarcts in bilateral frontal lobes (-largest in left posterior), Bilateral occipital and rigth thalamus; no stenosis. 2D echo with EF 55-60% and no wall abnormality. Neurology consulted and recommended resuming Xarelto for embolic stroke due to A Fib. CCS consulted for input on VAC and dressing changed yesterday.  Complaining of left leg pain, having difficulties sleeping Review of Systems - Objective: Vital Signs: Blood pressure 139/89, pulse 89, temperature 98.3 F (36.8 C), temperature source Oral, resp. rate 18, height 5\' 2"  (1.575 m), weight 81.239 kg (179 lb 1.6 oz), SpO2 96.00%. No results found. No results found for this or any previous visit (from the past 72 hour(s)).   HEENT: normal Cardio: irregular Resp: Wheezes and unlabored GI: BS positive and non distended Extremity:  No Edema Skin:   Wound Left lateral thigh STSG donor site with foul odor, yellow-green drainage, mild slough, graft itself looks healthy Neuro: Cranial Nerve II-XII normal, Abnormal Sensory cannot assess secondary to aphasia, Normal Motor, Abnormal FMC Ataxic RUE/ dec FMC, Dysarthric and Aphasic Musc/Skel:  Normal Gen NAD   Assessment/Plan: 1. Functional deficits secondary to Left posterior frontal embolic infarct which require 3+ hours per day of interdisciplinary therapy in a comprehensive inpatient rehab setting. Physiatrist is providing close team supervision and 24 hour management of active medical problems listed below. Physiatrist and rehab team continue to assess barriers to discharge/monitor patient progress toward functional  and medical goals.  FIM: FIM - Bathing Bathing Steps Patient Completed: Chest;Right Arm;Left Arm;Abdomen;Front perineal area;Buttocks;Right upper leg;Right lower leg (including foot);Left lower leg (including foot) Bathing: 5: Supervision: Safety issues/verbal cues  FIM - Upper Body Dressing/Undressing Upper body dressing/undressing steps patient completed: Thread/unthread right bra strap;Thread/unthread left bra strap;Hook/unhook bra;Thread/unthread right sleeve of pullover shirt/dresss;Thread/unthread left sleeve of pullover shirt/dress;Put head through opening of pull over shirt/dress;Pull shirt over trunk Upper body dressing/undressing: 5: Set-up assist to: Obtain clothing/put away FIM - Lower Body Dressing/Undressing Lower body dressing/undressing steps patient completed: Thread/unthread right underwear leg;Thread/unthread left underwear leg;Pull underwear up/down;Thread/unthread right pants leg;Thread/unthread left pants leg;Don/Doff right sock;Don/Doff left sock;Don/Doff right shoe;Pull pants up/down Lower body dressing/undressing: 4: Min-Patient completed 75 plus % of tasks  FIM - Toileting Toileting steps completed by patient: Adjust clothing prior to toileting;Performs perineal hygiene;Adjust clothing after toileting Toileting Assistive Devices: Grab bar or rail for support Toileting: 5: Supervision: Safety issues/verbal cues  FIM - Diplomatic Services operational officer Devices: Grab bars;Walker Toilet Transfers: 5-To toilet/BSC: Supervision (verbal cues/safety issues);5-From toilet/BSC: Supervision (verbal cues/safety issues)  FIM - Banker Devices: Walker;Arm rests Bed/Chair Transfer: 6: Supine > Sit: No assist;6: Sit > Supine: No assist;6: Bed > Chair or W/C: No assist;5: Chair or W/C > Bed: Supervision (verbal cues/safety issues)  FIM - Locomotion: Wheelchair Distance: 40 Locomotion: Wheelchair: 1: Total Assistance/staff pushes  wheelchair (Pt<25%) FIM - Locomotion: Ambulation Locomotion: Ambulation Assistive Devices: Designer, industrial/product Ambulation/Gait Assistance: 5: Supervision Locomotion: Ambulation: 5: Travels 150 ft or more with supervision/safety issues  Comprehension Comprehension Mode: Auditory Comprehension: 5-Follows basic conversation/direction: With extra time/assistive device  Expression Expression Mode: Verbal (expressive aphasia,frustated at times) Expression Assistive Devices: 6-Other (Comment) Expression: 3-Expresses basic 50 -  74% of the time/requires cueing 25 - 50% of the time. Needs to repeat parts of sentences.  Social Interaction Social Interaction: 3-Interacts appropriately 50 - 74% of the time - May be physically or verbally inappropriate.  Problem Solving Problem Solving: 5-Solves basic 90% of the time/requires cueing < 10% of the time  Memory Memory: 4-Recognizes or recalls 75 - 89% of the time/requires cueing 10 - 24% of the time   Medical Problem List and Plan:  1. DVT Prophylaxis/Anticoagulation: Pharmaceutical: Xarelto  2. Pain Management: Will add prn hydrocodone.  3. Mood: LCSW to follow for evaluation.  4. Neuropsych: This patient is capable of making decisions on her own behalf.  5. HTN: Monitor with bid checks. Blood pressures labile---resume lisinopril  6. Melanoma left lateral calf: S/P excision/STSG. Wound with odor, leg is painful. Afebrile, no leukocytosis  -culture wound, change dressing, keep leg elevated and extended  -iv ancef  -may need CCS follow up  7. A fib: Monitor heart rate with bid checks as may increase with activity. Is off coreg at this time.  8. Hypokalemia:   Will supplement briefly--dilutional due to IVF. Discontinue IVF.  9. 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  Wheezing no SOB monitor during therapy, has inhalers LOS (Days) 9 A FACE TO FACE EVALUATION WAS PERFORMED  SWARTZ,ZACHARY T 06/01/2013,  7:59 AM

## 2013-06-01 NOTE — Plan of Care (Signed)
Problem: RH SKIN INTEGRITY Goal: RH STG SKIN FREE OF INFECTION/BREAKDOWN Skin free of infection or breakdown with min assist while in Rehab  Outcome: Progressing 1. Incision tl l leg redness, dressing in place . 2. L thigh donor site w/ dressing in place, redness,tender 3. L groin dressing ,redness dressing in place.

## 2013-06-01 NOTE — Plan of Care (Signed)
Problem: RH PAIN MANAGEMENT Goal: RH STG PAIN MANAGED AT OR BELOW PT'S PAIN GOAL Pain level <4.  Outcome: Progressing Pt has pain but does not to take pain meds, Will take tylenol ,till pain too severe and then will take ultram prn.

## 2013-06-02 LAB — CBC
MCHC: 32.3 g/dL (ref 30.0–36.0)
Platelets: 374 10*3/uL (ref 150–400)
RBC: 3.7 MIL/uL — ABNORMAL LOW (ref 3.87–5.11)
RDW: 14.8 % (ref 11.5–15.5)
WBC: 8.6 10*3/uL (ref 4.0–10.5)

## 2013-06-02 LAB — BASIC METABOLIC PANEL
BUN: 8 mg/dL (ref 6–23)
Chloride: 106 mEq/L (ref 96–112)
Creatinine, Ser: 0.44 mg/dL — ABNORMAL LOW (ref 0.50–1.10)
GFR calc Af Amer: >90 mL/min (ref >90)
GFR calc non Af Amer: >90 mL/min (ref >90)
Potassium: 4.1 mEq/L (ref 3.5–5.1)

## 2013-06-02 NOTE — Progress Notes (Signed)
Patient ID: Melanie Wood, female   DOB: [birth year], 79 y.o.   MRN: [id number] Subjective/Complaints: Melanie Wood is a 79 y.o. RH-female with history of A fib, HTN, who was taken off Xarelto to have melanoma removed. On 05/17/13, she was admitted with slurred speech with expressive difficulties, right facial droop and RUE weakness. MRI/MRA brain showed acute bilateral cerebral infarcts in bilateral frontal lobes (-largest in left posterior), Bilateral occipital and rigth thalamus; no stenosis. 2D echo with EF 55-60% and no wall abnormality. Neurology consulted and recommended resuming Xarelto for embolic stroke due to A Fib. CCS consulted for input on VAC and dressing changed yesterday.  Left leg pain better. Feels sore all over ("bed"). Denies fever, chills Review of Systems - Objective: Vital Signs: Blood pressure 149/86, pulse 90, temperature 98.6 F (37 C), temperature source Oral, resp. rate 18, height 5\' 2"  (1.575 m), weight 81.239 kg (179 lb 1.6 oz), SpO2 98.00%. Dg Tibia/fibula Left  06/01/2013   *RADIOLOGY REPORT*  Clinical Data: Post recent melanoma removal from the lower leg, now with diffuse pain, evaluate for osteomyelitis.  LEFT TIBIA AND FIBULA - 2 VIEW  Comparison: None.  Findings:  There is a large soft tissue defect involving the subcutaneous tissues of the posterior lateral calf.  This finding is without associated radiopaque foreign body or discrete area of osteolysis to suggest osteomyelitis.  No fracture.  Post replacement of the medial compartment of the left knee without evidence of hardware failure or loosening.  Limited visualization of the ankle is normal given obliquity.  IMPRESSION: Large soft tissue defect involving the subcutaneous tissues of the posterior lateral calf without associated evidence of osteomyelitis.   Original Report Authenticated By: Tacey Ruiz, MD   Results for orders placed during the hospital encounter of 05/23/13 (from the past 72 hour(s))  WOUND  CULTURE     Status: None   Collection Time    06/01/13  6:33 AM      Result Value Range   Specimen Description WOUND LEG LEFT     Special Requests FROM BACK OF LEFT CALF WHERE MELANOMA WAS REMOVED     Gram Stain PENDING     Culture       Value: Culture reincubated for better growth     Performed at [facility]   Report Status PENDING    CBC     Status: Abnormal   Collection Time    06/02/13  5:40 AM      Result Value Range   WBC 8.6  4.0 - 10.5 K/uL   RBC 3.70 (*) 3.87 - 5.11 MIL/uL   Hemoglobin 11.0 (*) 12.0 - 15.0 g/dL   HCT 40.9 (*) 81.1 - 91.4 %   MCV 92.2  78.0 - 100.0 fL   MCH 29.7  26.0 - 34.0 pg   MCHC 32.3  30.0 - 36.0 g/dL   RDW 78.2  95.6 - 21.3 %   Platelets 374  150 - 400 K/uL  BASIC METABOLIC PANEL     Status: Abnormal   Collection Time    06/02/13  5:40 AM      Result Value Range   Sodium 143  135 - 145 mEq/L   Potassium 4.1  3.5 - 5.1 mEq/L   Chloride 106  96 - 112 mEq/L   CO2 27  19 - 32 mEq/L   Glucose, Bld 98  70 - 99 mg/dL   BUN 8  6 - 23 mg/dL   Creatinine, Ser 0.86 (*)  0.50 - 1.10 mg/dL   Calcium 8.5  8.4 - 16.1 mg/dL   GFR calc non Af Amer >90  >90 mL/min   GFR calc Af Amer >90  >90 mL/min   Comment: (NOTE)     The eGFR has been calculated using the CKD EPI equation.     This calculation has not been validated in all clinical situations.     eGFR's persistently <90 mL/min signify possible Chronic Kidney     Disease.     HEENT: normal Cardio: irregular Resp: Wheezes and unlabored GI: BS positive and non distended Extremity:  No Edema Skin:   Wound Left lateral thigh STSG donor site with foul odor, yellow-green drainage, mild slough, graft itself looks healthy Neuro: Cranial Nerve II-XII normal, Abnormal Sensory cannot assess secondary to aphasia, Normal Motor, Abnormal FMC Ataxic RUE/ dec FMC, Dysarthric and Aphasic Musc/Skel:  Normal Gen NAD   Assessment/Plan: 1. Functional deficits secondary to Left posterior frontal embolic  infarct which require 3+ hours per day of interdisciplinary therapy in a comprehensive inpatient rehab setting. Physiatrist is providing close team supervision and 24 hour management of active medical problems listed below. Physiatrist and rehab team continue to assess barriers to discharge/monitor patient progress toward functional and medical goals.  Mod I in the room. Home tomorrow if all is ok with wound  FIM: FIM - Bathing Bathing Steps Patient Completed: Chest;Right Arm;Left Arm;Abdomen;Front perineal area;Buttocks;Right upper leg;Right lower leg (including foot);Left lower leg (including foot) Bathing: 7: Complete Independence: No helper  FIM - Upper Body Dressing/Undressing Upper body dressing/undressing steps patient completed: Thread/unthread right bra strap;Thread/unthread left bra strap;Hook/unhook bra;Thread/unthread right sleeve of pullover shirt/dresss;Thread/unthread left sleeve of pullover shirt/dress;Put head through opening of pull over shirt/dress;Pull shirt over trunk Upper body dressing/undressing: 7: Complete Independence: No helper FIM - Lower Body Dressing/Undressing Lower body dressing/undressing steps patient completed: Thread/unthread right underwear leg;Thread/unthread left underwear leg;Pull underwear up/down;Thread/unthread right pants leg;Thread/unthread left pants leg;Don/Doff right sock;Don/Doff left sock;Don/Doff right shoe;Pull pants up/down Lower body dressing/undressing: 4: Steadying Assist  FIM - Toileting Toileting steps completed by patient: Adjust clothing prior to toileting;Adjust clothing after toileting;Performs perineal hygiene Toileting Assistive Devices: Grab bar or rail for support Toileting: 6: Assistive device: No helper  FIM - Diplomatic Services operational officer Devices: Grab bars;Walker Toilet Transfers: 6-Assistive device: No helper  FIM - Banker Devices: Therapist, occupational: 6:  Assistive device: no helper  FIM - Locomotion: Wheelchair Distance: 40 Locomotion: Wheelchair: 1: Total Assistance/staff pushes wheelchair (Pt<25%) FIM - Locomotion: Ambulation Locomotion: Ambulation Assistive Devices: Designer, industrial/product Ambulation/Gait Assistance: 5: Supervision Locomotion: Ambulation: 6: Travels 150 ft or more with assistive device/no helper  Comprehension Comprehension Mode: Auditory Comprehension: 5-Follows basic conversation/direction: With no assist  Expression Expression Mode: Verbal (expressive aphasia,frustated at times) Expression Assistive Devices: 6-Other (Comment) Expression: 5-Expresses basic needs/ideas: With extra time/assistive device  Social Interaction Social Interaction: 5-Interacts appropriately 90% of the time - Needs monitoring or encouragement for participation or interaction.  Problem Solving Problem Solving: 3-Solves basic 50 - 74% of the time/requires cueing 25 - 49% of the time  Memory Memory: 3-Recognizes or recalls 50 - 74% of the time/requires cueing 25 - 49% of the time   Medical Problem List and Plan:  1. DVT Prophylaxis/Anticoagulation: Pharmaceutical: Xarelto  2. Pain Management: Will add prn hydrocodone.  3. Mood: LCSW to follow for evaluation.  4. Neuropsych: This patient is capable of making decisions on her own behalf.  5. HTN: Monitor with  bid checks. Blood pressures labile---resume lisinopril  6. Melanoma left lateral calf: S/P excision/STSG. Wound with minimal odor today, but still with greenish drainage.  -graft itself looks ok, xrays without abnl,  -wound culture pending  -spoke to general surgery and asked them to take a look as well  -will hold ancef at this point given she's afebrile and wbc's normal  -changed dressing orders to decrease amount of hydrogel being used 7. A fib: Monitor heart rate with bid checks as may increase with activity. Is off coreg at this time.  8. Hypokalemia:   Will supplement  briefly--dilutional due to IVF. Discontinue IVF.  9. 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  Wheezing no SOB monitor during therapy, has inhalers LOS (Days) 10 A FACE TO FACE EVALUATION WAS PERFORMED  SWARTZ,ZACHARY T 06/02/2013, 7:54 AM

## 2013-06-03 ENCOUNTER — Encounter (HOSPITAL_COMMUNITY): Payer: Medicare Other

## 2013-06-03 ENCOUNTER — Inpatient Hospital Stay (HOSPITAL_COMMUNITY): Payer: Medicare Other | Admitting: Occupational Therapy

## 2013-06-03 ENCOUNTER — Ambulatory Visit (HOSPITAL_COMMUNITY): Payer: Medicare Other

## 2013-06-03 DIAGNOSIS — S81009A Unspecified open wound, unspecified knee, initial encounter: Secondary | ICD-10-CM

## 2013-06-03 DIAGNOSIS — S91009A Unspecified open wound, unspecified ankle, initial encounter: Secondary | ICD-10-CM

## 2013-06-03 DIAGNOSIS — S81809A Unspecified open wound, unspecified lower leg, initial encounter: Secondary | ICD-10-CM

## 2013-06-03 MED ORDER — CIPROFLOXACIN HCL 250 MG PO TABS
250.0000 mg | ORAL_TABLET | Freq: Two times a day (BID) | ORAL | Status: DC
  Administered 2013-06-03: 250 mg via ORAL
  Filled 2013-06-03 (×3): qty 1

## 2013-06-03 MED ORDER — GUAIFENESIN ER 600 MG PO TB12: 600.0000 mg | ORAL_TABLET | Freq: Two times a day (BID) | ORAL | Status: AC

## 2013-06-03 MED ORDER — TRAMADOL HCL 50 MG PO TABS: 50.0000 mg | ORAL_TABLET | Freq: Four times a day (QID) | ORAL | Status: AC | PRN

## 2013-06-03 MED ORDER — CIPROFLOXACIN HCL 250 MG PO TABS: 250.0000 mg | ORAL_TABLET | Freq: Two times a day (BID) | ORAL | Status: DC

## 2013-06-03 MED ORDER — SENNOSIDES-DOCUSATE SODIUM 8.6-50 MG PO TABS: 2.0000 | ORAL_TABLET | Freq: Two times a day (BID) | ORAL | Status: AC

## 2013-06-03 MED ORDER — HYDROCODONE-ACETAMINOPHEN 5-325 MG PO TABS
1.0000 | ORAL_TABLET | Freq: Four times a day (QID) | ORAL | Status: AC | PRN
Start: 2013-06-03 — End: ?

## 2013-06-03 MED ORDER — ATORVASTATIN CALCIUM 40 MG PO TABS: 40.0000 mg | ORAL_TABLET | Freq: Every day | ORAL | Status: AC

## 2013-06-03 NOTE — Progress Notes (Signed)
Occupational Therapy Session Note  Patient Details  Name: Melanie Wood MRN: [id number] Date of Birth: March 11, 1930  Today's Date: 06/03/2013 Time: 0900-0945 Time Calculation (min): 45 min  Short Term Goals: No short term goals set  Skilled Therapeutic Interventions/Progress Updates:      Pt seen for BADL retraining of toileting, bathing, and dressing with a focus on family education and pt completing activities at a mod I level.  Pt was able to ambulate around room with RW and gather items independently. She needed assist to apply plastic cover over leg for shower, but was otherwise mod I with all self care.  Recommended to her family that she should have supervision with her shower transfers for the first 2 weeks for safety and she will need assist to cover surgical sites on LLE.  Pt has all necessary equipment and is ready for d/c today.  Her family is competent and has received fam. Education.  Therapy Documentation Precautions:  Precautions Precautions: Fall;Other (comment) Precaution Comments: recent Lcalf surgery for melanoma, requiring lymph node removal and skin graft from thigh Restrictions Weight Bearing Restrictions: No  Pain: Pain Assessment Pain Assessment: No/denies pain  ADL:  See FIM for current functional status  Therapy/Group: Individual Therapy  SAGUIER,JULIA 06/03/2013, 11:43 AM

## 2013-06-03 NOTE — Progress Notes (Signed)
Social Work Discharge Note Discharge Note  The overall goal for the admission was met for:   Discharge location: Yes-BACK TO [facility]  Length of Stay: Yes-11 DAYS  Discharge activity level: Yes-MOD/I LEVEL  Home/community participation: Yes  Services provided included: MD, RD, PT, OT, SLP, RN, Pharmacy and Neuropsych  Financial Services: Private Insurance: BLUE MEDICARE  Follow-up services arranged: Home Health: [facility]-PT,OT,RN,SPT and Patient/Family request agency HH: [facility], DME: NONE NEEDED  Comments (or additional information):FAMIL;Y EDUCATION COMPLETED AND DAUGHTER TO STAY WITH Monday FOR THE TRANSITION  Patient/Family verbalized understanding of follow-up arrangements: Yes  Individual responsible for coordination of the follow-up plan: TERESA-DAUGHTER  Confirmed correct DME delivered: Lucy Chris 06/03/2013    Lucy Chris

## 2013-06-03 NOTE — Progress Notes (Signed)
Patient ID: Melanie Wood, female   DOB: 09/06/1929, 79 y.o.   MRN: [id number] Subjective/Complaints: Melanie Wood is a 79 y.o. RH-female with history of A fib, HTN, who was taken off Xarelto to have melanoma removed. On 05/17/13, she was admitted with slurred speech with expressive difficulties, right facial droop and RUE weakness. MRI/MRA brain showed acute bilateral cerebral infarcts in bilateral frontal lobes (-largest in left posterior), Bilateral occipital and rigth thalamus; no stenosis. 2D echo with EF 55-60% and no wall abnormality. Neurology consulted and recommended resuming Xarelto for embolic stroke due to A Fib. CCS consulted for input on VAC and dressing changed yesterday. LLE pain improved Xray neg Review of Systems - unable to obtain secondary to aphasia Objective: Vital Signs: Blood pressure 107/61, pulse 69, temperature 97.4 F (36.3 C), temperature source Oral, resp. rate 16, height 5\' 2"  (1.575 m), weight 81.239 kg (179 lb 1.6 oz), SpO2 97.00%. Dg Tibia/fibula Left  06/01/2013   *RADIOLOGY REPORT*  Clinical Data: Post recent melanoma removal from the lower leg, now with diffuse pain, evaluate for osteomyelitis.  LEFT TIBIA AND FIBULA - 2 VIEW  Comparison: None.  Findings:  There is a large soft tissue defect involving the subcutaneous tissues of the posterior lateral calf.  This finding is without associated radiopaque foreign body or discrete area of osteolysis to suggest osteomyelitis.  No fracture.  Post replacement of the medial compartment of the left knee without evidence of hardware failure or loosening.  Limited visualization of the ankle is normal given obliquity.  IMPRESSION: Large soft tissue defect involving the subcutaneous tissues of the posterior lateral calf without associated evidence of osteomyelitis.   Original Report Authenticated By: Tacey Ruiz, MD   Results for orders placed during the hospital encounter of 05/23/13 (from the past 72 hour(s))  WOUND CULTURE      Status: None   Collection Time    06/01/13  6:33 AM      Result Value Range   Specimen Description WOUND LEG LEFT     Special Requests FROM BACK OF LEFT CALF WHERE MELANOMA WAS REMOVED     Gram Stain       Value: RARE WBC PRESENT,BOTH PMN AND MONONUCLEAR     NO SQUAMOUS EPITHELIAL CELLS SEEN     FEW GRAM NEGATIVE RODS     Performed at [facility]   Culture       Value: MODERATE STAPHYLOCOCCUS AUREUS     Note: RIFAMPIN AND GENTAMICIN SHOULD NOT BE USED AS SINGLE DRUGS FOR TREATMENT OF STAPH INFECTIONS.     MODERATE PSEUDOMONAS AERUGINOSA     Performed at [facility]   Report Status PENDING    CBC     Status: Abnormal   Collection Time    06/02/13  5:40 AM      Result Value Range   WBC 8.6  4.0 - 10.5 K/uL   RBC 3.70 (*) 3.87 - 5.11 MIL/uL   Hemoglobin 11.0 (*) 12.0 - 15.0 g/dL   HCT 40.9 (*) 81.1 - 91.4 %   MCV 92.2  78.0 - 100.0 fL   MCH 29.7  26.0 - 34.0 pg   MCHC 32.3  30.0 - 36.0 g/dL   RDW 78.2  95.6 - 21.3 %   Platelets 374  150 - 400 K/uL  BASIC METABOLIC PANEL     Status: Abnormal   Collection Time    06/02/13  5:40 AM      Result Value Range  Sodium 143  135 - 145 mEq/L   Potassium 4.1  3.5 - 5.1 mEq/L   Chloride 106  96 - 112 mEq/L   CO2 27  19 - 32 mEq/L   Glucose, Bld 98  70 - 99 mg/dL   BUN 8  6 - 23 mg/dL   Creatinine, Ser 4.09 (*) 0.50 - 1.10 mg/dL   Calcium 8.5  8.4 - 81.1 mg/dL   GFR calc non Af Amer >90  >90 mL/min   GFR calc Af Amer >90  >90 mL/min   Comment: (NOTE)     The eGFR has been calculated using the CKD EPI equation.     This calculation has not been validated in all clinical situations.     eGFR's persistently <90 mL/min signify possible Chronic Kidney     Disease.     HEENT: normal Cardio: irregular Resp: Wheezes and unlabored GI: BS positive and non distended Extremity:  No Edema Skin:   Wound Left lateral thigh STSG donor site and Other Left lateral calf full thickness excision Neuro: Cranial Nerve II-XII  normal, Abnormal Sensory cannot assess secondary to aphasia, Normal Motor, Abnormal FMC Ataxic RUE/ dec FMC, Dysarthric and Aphasic Musc/Skel:  Normal Gen NAD   Assessment/Plan: 1. Functional deficits secondary to Left posterior frontal embolic infarct which require 3+ hours per day of interdisciplinary therapy in a comprehensive inpatient rehab setting. Physiatrist is providing close team supervision and 24 hour management of active medical problems listed below. Physiatrist and rehab team continue to assess barriers to discharge/monitor patient progress toward functional and medical goals.  FIM: FIM - Bathing Bathing Steps Patient Completed: Chest;Right Arm;Left Arm;Abdomen;Front perineal area;Buttocks;Right upper leg;Right lower leg (including foot);Left lower leg (including foot) Bathing: 7: Complete Independence: No helper  FIM - Upper Body Dressing/Undressing Upper body dressing/undressing steps patient completed: Thread/unthread right bra strap;Thread/unthread left bra strap;Hook/unhook bra;Thread/unthread right sleeve of pullover shirt/dresss;Thread/unthread left sleeve of pullover shirt/dress;Put head through opening of pull over shirt/dress;Pull shirt over trunk Upper body dressing/undressing: 7: Complete Independence: No helper FIM - Lower Body Dressing/Undressing Lower body dressing/undressing steps patient completed: Thread/unthread right underwear leg;Thread/unthread left underwear leg;Pull underwear up/down;Thread/unthread right pants leg;Thread/unthread left pants leg;Don/Doff right sock;Don/Doff left sock;Don/Doff right shoe;Pull pants up/down Lower body dressing/undressing: 4: Steadying Assist  FIM - Toileting Toileting steps completed by patient: Adjust clothing prior to toileting;Adjust clothing after toileting;Performs perineal hygiene Toileting Assistive Devices: Grab bar or rail for support Toileting: 6: Assistive device: No helper  FIM - Quarry manager Devices: Grab bars;Walker Toilet Transfers: 6-Assistive device: No helper  FIM - Banker Devices: Therapist, occupational: 6: Assistive device: no helper  FIM - Locomotion: Wheelchair Distance: 40 Locomotion: Wheelchair: 1: Total Assistance/staff pushes wheelchair (Pt<25%) FIM - Locomotion: Ambulation Locomotion: Ambulation Assistive Devices: Designer, industrial/product Ambulation/Gait Assistance: 5: Supervision Locomotion: Ambulation: 6: Travels 150 ft or more with assistive device/no helper  Comprehension Comprehension Mode: Auditory Comprehension: 5-Follows basic conversation/direction: With no assist  Expression Expression Mode: Verbal (expressive aphasia,frustated at times) Expression Assistive Devices: 6-Other (Comment) Expression: 5-Expresses basic needs/ideas: With extra time/assistive device  Social Interaction Social Interaction: 5-Interacts appropriately 90% of the time - Needs monitoring or encouragement for participation or interaction.  Problem Solving Problem Solving: 3-Solves basic 50 - 74% of the time/requires cueing 25 - 49% of the time  Memory Memory: 3-Recognizes or recalls 50 - 74% of the time/requires cueing 25 - 49% of the time   Medical Problem List and  Plan:  1. DVT Prophylaxis/Anticoagulation: Pharmaceutical: Xarelto  2. Pain Management: Will add prn ultram.  3. Mood: LCSW to follow for evaluation.  4. Neuropsych: This patient is capable of making decisions on her own behalf.  5. HTN: Monitor with bid checks. Blood pressures labile---resume lisinopril  6. Melanoma left lateral calf: S/P excision Daily dressing change to left calf--to be kept moist with change of topper daily. Will f/u with Gen Surg Wed, local wound care for now, await culture results 7. A fib: Monitor heart rate with bid checks as may increase with activity. Is off coreg at this time.  8. Hypokalemia: Recheck in am. Will supplement  briefly--dilutional due to IVF. Discontinue IVF.  9. 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  Wheezing no SOB monitor during therapy, has inhalers LOS (Days) 11 A FACE TO FACE EVALUATION WAS PERFORMED  KIRSTEINS,ANDREW E 06/03/2013, 7:57 AM

## 2013-06-03 NOTE — Progress Notes (Signed)
Patient and daughters received written and verbal discharge instructions from [facility], aware of follow up appointments, restrictions, medications, wound care, prescriptions, deny any questions or concerns, personal belongings packed by patient and family, patient taken down to private vehicle by NT via wheelchair. [place], [employer] Jester

## 2013-06-03 NOTE — Discharge Summary (Signed)
Physician Discharge Summary  Patient ID: Melanie Wood MRN: [id number] DOB/AGE: [birth year] 77 y.o.  Admit date: 05/23/2013 Discharge date: 06/03/2013  Discharge Diagnoses:  Principal Problem:   CVA (cerebral vascular accident)-bicerebral/bicerebellar embolic infarcts--largest left posterior frontal Active Problems:   Aphasia   Atrial fibrillation   Melanoma left shin--s/p excision with skin graft   Discharged Condition: Stable.   Significant Diagnostic Studies: Dg Tibia/fibula Left  06/01/2013   *RADIOLOGY REPORT*  Clinical Data: Post recent melanoma removal from the lower leg, now with diffuse pain, evaluate for osteomyelitis.  LEFT TIBIA AND FIBULA - 2 VIEW  Comparison: None.  Findings:  There is a large soft tissue defect involving the subcutaneous tissues of the posterior lateral calf.  This finding is without associated radiopaque foreign body or discrete area of osteolysis to suggest osteomyelitis.  No fracture.  Post replacement of the medial compartment of the left knee without evidence of hardware failure or loosening.  Limited visualization of the ankle is normal given obliquity.  IMPRESSION: Large soft tissue defect involving the subcutaneous tissues of the posterior lateral calf without associated evidence of osteomyelitis.   Original Report Authenticated By: Tacey Ruiz, MD    Labs:  Basic Metabolic Panel:  Recent Labs Lab 06/02/13 0540  NA 143  K 4.1  CL 106  CO2 27  GLUCOSE 98  BUN 8  CREATININE 0.44*  CALCIUM 8.5    CBC:  Recent Labs Lab 06/02/13 0540  WBC 8.6  HGB 11.0*  HCT 34.1*  MCV 92.2  PLT 374    CBG: No results found for this basename: GLUCAP,  in the last 168 hours  Brief HPI:   Melanie Wood is a 79 y.o. RH-female with history of A fib, HTN, who was taken off Xarelto to have melanoma removed. On 05/17/13, she was admitted with slurred speech with expressive difficulties, right facial droop and RUE weakness. MRI/MRA brain showed acute  bilateral cerebral infarcts in bilateral frontal lobes (-largest in left posterior), Bilateral occipital and rigth thalamus; no stenosis. 2D echo with EF 55-60% and no wall abnormality. Neurology recommended resuming Xarelto for embolic stroke due to A Fib. CCS consulted for input on VAC and dressing change. Patient with blue edges on calf wound due to methylene blue injection per reports. As patient continues with expressive deficits as well as reading deficits with suspicion of visual deficits, right sided weakness as well as problems with mobility--CIR was recommended byk therapy team.   Hospital Course: Melanie Wood was admitted to rehab 05/23/2013 for inpatient therapies to consist of PT, ST and OT at least three hours five days a week. Past admission physiatrist, therapy team and rehab RN have worked together to provide customized collaborative inpatient rehab. She was noted to have diffuse wheezing at admission with SOB and nebs were scheduled to help with respiratory status. Mucinex was added to help with congestion. Respiratory status has greatly improved and nebs were changed back to prn. Po intake has been good and lytes are stable. Hypokalemia has resolved. Blood pressures were trending upward therefore home dose lisinopril was resumed. No orthostasis reported with activity. H/H has been stable. She was educated on carb modified diet due to impaired fasting glucose with BS ranging form 110- 140 range. Patient and family declined frequent monitoring during her stay.   LLE wounds have been monitored along. She reported increase in pain as well as drainage  of left calf wound on 05/31/13 pm. Wound was cultured and shows staph aures and pseudomonas  aeruginosa. She was treated with IV antibiotics X 24 hours. Xrays of left shin show no evidence of osteomyelitis  Dr. Janee Morn evaluated wound and felt that cultures indicated colonization as patient was afebrile and WBC stable. He recommended Cipro for wound  prophylaxis as Dakins solution was contraindicated due to her skin graft. Dressing changes to be increased to bid basis to help manage drainage. She is to follow up with her surgeon in the next  two days.    Rehab course: During patient's stay in rehab weekly team conferences were held to monitor patient's progress, set goals and discuss barriers to discharge. Patient has had improvement in activity tolerance, balance, postural control, as well as ability to compensate for deficits. She is has had improvement in functional use RUE and as well as improved awareness. Speech therapy has worked on Restaurant manager, fast food. She requires moderate assist for communication and continues to have a low frustration tolerance.  She requires increased wait time with gestures for yes/no questions as well as a Teacher, early years/pre conversation. She requires moderate cues to produce sentences that are 5-10 words in length.  Occupational therapy has focused on neuromuscular reeducation of UE as well as ADL tasks and endurance. She is modified independent to gather items for ADL and perform bathing and dressing tasks. She requires supervision for shower transfers and to apply plastic cover to Left calf prior to showers. Physical therapy has focused on mobility, transfers, w/c propulsion, and balance.  She is able to ambulate short distances in household setting at modified independent level with rolling walker. She is able to ambulate up to 200 feet with RW and slow steady cadence as well as supervision.    Family education was done with daughter regarding need for assistance with shower transfers as well a wound care. Patient has made good progress and is modified independent in supervised setting. She will continue to receive  Home health PT, OT, ST as well as RN for follow up past discharge.    Disposition:  Assisted Living facility  Diet: Diabetic diet.   Special Instructions: 1. Change  dressing on left calf wound twice a day.  2. Contact surgeon for problems with wound. 3. Keep left groin wound clean and dry--can cleanse more frequently for dampness. Apply micro guard powder as needed.        Future Appointments Provider Department Dept Phone   06/25/2013 11:30 AM Erick Colace, MD [phone]       Medication List    STOP taking these medications       aspirin 81 MG tablet     donepezil 10 MG tablet  Commonly known as:  ARICEPT     furosemide 20 MG tablet  Commonly known as:  LASIX     lisinopril-hydrochlorothiazide 20-12.5 MG per tablet  Commonly known as:  PRINZIDE,ZESTORETIC     potassium chloride SA 20 MEQ tablet  Commonly known as:  K-DUR,KLOR-CON     TYLENOL/CODEINE #3 300-30 MG per tablet  Generic drug:  Acetaminophen-Codeine      TAKE these medications       atorvastatin 40 MG tablet  Commonly known as:  LIPITOR  Take 1 tablet (40 mg total) by mouth daily at 6 PM.     benzonatate 100 MG capsule  Commonly known as:  TESSALON  Take 100 mg by mouth 3 (three) times daily as needed for cough.     carvedilol 6.25 MG tablet  Commonly known as:  COREG  Take 6.25 mg by mouth 2 (two) times daily with a meal.     ciprofloxacin 250 MG tablet  Commonly known as:  CIPRO  Take 1 tablet (250 mg total) by mouth 2 (two) times daily.     Fluticasone-Salmeterol 100-50 MCG/DOSE Aepb  Commonly known as:  ADVAIR  Inhale 1 puff into the lungs every 12 (twelve) hours.     guaiFENesin 600 MG 12 hr tablet  Commonly known as:  MUCINEX  Take 1 tablet (600 mg total) by mouth 2 (two) times daily.     HYDROcodone-acetaminophen 5-325 MG per tablet  Commonly known as:  NORCO/VICODIN  Take 1 tablet by mouth every 6 (six) hours as needed. For severe pain     ipratropium-albuterol 0.5-2.5 (3) MG/3ML Soln  Commonly known as:  DUONEB  Take 3 mLs by nebulization every 8 (eight) hours as needed.     lisinopril 2.5 MG tablet   Commonly known as:  PRINIVIL,ZESTRIL  Take 1.25 mg by mouth daily.     METANX 3-90.314-2-35 MG Caps  Take 1 tablet by mouth daily.     nystatin 100000 UNIT/GM Powd  Apply 1 g topically 3 (three) times daily.     pantoprazole 40 MG injection  Commonly known as:  PROTONIX  Inject 40 mg into the vein as needed (for GERD).     Rivaroxaban 15 MG Tabs tablet  Commonly known as:  XARELTO  Take 15 mg by mouth daily.     senna-docusate 8.6-50 MG per tablet  Commonly known as:  Senokot-S  Take 2 tablets by mouth 2 (two) times daily.     tiotropium 18 MCG inhalation capsule  Commonly known as:  SPIRIVA  Place 18 mcg into inhaler and inhale daily.     traMADol 50 MG tablet  Commonly known as:  ULTRAM  Take 1 tablet (50 mg total) by mouth every 6 (six) hours as needed. For moderate pain       Follow-up Information   Follow up with Erick Colace, MD On 06/25/2013. (BE THERE AT 11 AM FOR 11:30 APPOINTMENT)    Specialty:  Physical Medicine and Rehabilitation   Contact information:   [address] [place] [address] [phone]       Follow up with Gates Rigg, MD. Call today. (for follow up in 6 weeks. )    Specialties:  Neurology, Radiology   Contact information:   [address] [place] [address] [phone]       Follow up with Ida Rogue, MD. (Keep Wed appointment)    Specialty:  Surgery   Contact information:   [facility] [place] [address] [place] [address] [phone]       Signed: Jacquelynn Cree 06/03/2013, 11:38 AM

## 2013-06-03 NOTE — Progress Notes (Signed)
Speech Language Pathology Discharge Summary & Daily Session Note  Patient Details  Name: [name] MRN: [id number] Date of Birth: [birth year]  Today's Date: 06/03/2013 Time: 4098-1191 Time Calculation (min): 45 min  Skilled Therapeutic Intervention: Treatment focused on pt/family education, with pt's daughters Rosey Bath and Kriste Basque present. SLP facilitated session by providing education regarding swallowing and cognitive-linguistic strategies. Pt became very frustrated very quickly, and pt's daughters requested that education be completed with them in private to allow their mother to rest. SLP provided further education and reviewed strategies to be utilized upon discharge. All questions were answered, handouts were provided, and pt's daughters verbalized their understanding of the information presented.  Patient has met 3 of 3 long term goals.  Patient to discharge at overall Mod level.  Reasons goals not met: N/A   Clinical Impression/Discharge Summary: Pt has made functional gains in speech therapy, reaching 3/3 LTGs during this admission. She has been tolerating Dys. 3 textures and thin liquids, using her compensatory strategies with Mod I. She requires Mod cues overall for recall of new information as well as expressive communication. Pt/family education has been completed. Pt is being d/c from hospital back to her ALF. This SLP has expressed concerns to family and SW regarding pt's ability to communicate her wants/needs without full supervision.   Care Partner:  Caregiver Able to Provide Assistance: Other (comment) (returning to ALF)  Type of Caregiver Assistance: Cognitive  Recommendation:  24 hour supervision/assistance;Home Health SLP  Rationale for SLP Follow Up: Maximize functional communication;Maximize cognitive function and independence;Maximize swallowing safety   Equipment: None recommended by SLP   Reasons for discharge: Treatment goals met;Discharged from hospital    Patient/Family Agrees with Progress Made and Goals Achieved: Yes   See FIM for current functional status  Maxcine Ham 06/03/2013, 1:41 PM  Maxcine Ham, M.A. CCC-SLP [phone]

## 2013-06-03 NOTE — Progress Notes (Signed)
Occupational Therapy Discharge Summary  Patient Details  Name: Melanie Wood MRN: [id number] Date of Birth: 02-07-1930  Today's Date: 06/03/2013  Patient has met 7 of 7 long term goals due to improved activity tolerance, improved balance, ability to compensate for deficits and improved coordination.  Patient to discharge at overall Modified Independent level.  Patient's care partner is independent to provide the necessary physical assistance at discharge.    Reasons goals not met: n/a  Recommendation:  Patient will benefit from ongoing skilled OT services in home health setting to continue to advance functional skills in the area of BADL.  Equipment: No equipment provided  Reasons for discharge: treatment goals met  Patient/family agrees with progress made and goals achieved: Yes  OT Discharge ADL    Refer to FIM Vision/Perception  Vision - Assessment Eye Alignment: Within Functional Limits Ocular Range of Motion: Within Functional Limits Tracking/Visual Pursuits: Able to track stimulus in all quads without difficulty Perception Perception: Within Functional Limits Praxis Praxis: Intact  Cognition Orientation Level: Oriented X4 Sensation Sensation Light Touch: Impaired by gross assessment Stereognosis: Appears Intact Hot/Cold: Appears Intact Proprioception: Appears Intact Additional Comments: Decreased light touch L foot Coordination Gross Motor Movements are Fluid and Coordinated: Yes Fine Motor Movements are Fluid and Coordinated: No Finger Nose Finger Test: Mild dysmetria L f Heel Shin Test: Decreased accuracy, excursion, and rhythm, slightly improved over admission, limited by pain Motor  Motor Motor: Ataxia Motor - Skilled Clinical Observations: generalized weakness and decreased coordination with rapid alternating movements Mobility  Bed Mobility Bed Mobility: Rolling Right (modified independent for all) Refer to FIM Trunk/Postural Assessment  Cervical  Assessment Cervical Assessment: Within Functional Limits Cervical Strength Overall Cervical Strength Comments: Pt c/o neck pain at end of tx due to fatigue from unsupported sitting during eval Thoracic Assessment Thoracic Assessment: Within Functional Limits Lumbar Assessment Lumbar Assessment: Within Functional Limits Postural Control Postural Control: Deficits on evaluation Righting Reactions: Delayed Postural Limitations: improved since admission, but still with decreased postural endurance, fatigue from unsuppported sitting; pt has hx low back pain due to stenosis  Balance Balance Balance Assessed: Yes Static Sitting Balance Static Sitting - Level of Assistance: 7: Independent Dynamic Sitting Balance Dynamic Sitting - Level of Assistance: 6: Modified independent (Device/Increase time) Static Standing Balance Static Standing - Level of Assistance: 6: Modified independent (Device/Increase time) Dynamic Standing Balance Dynamic Standing - Level of Assistance: 6: Modified independent (Device/Increase time) Dynamic Standing - Comments: limited reaching out of BOS due to LBP Extremity/Trunk Assessment RUE Assessment RUE Assessment: Within Functional Limits LUE Assessment LUE Assessment: Within Functional Limits  See FIM for current functional status  Lashondra Vaquerano 06/03/2013, 11:49 AM

## 2013-06-03 NOTE — Plan of Care (Signed)
Problem: RH KNOWLEDGE DEFICIT Goal: RH STG INCREASE KNOWLEDGE OF DIABETES Patient/caregiver will verbalize diabetes management, diet, medication, and signs and symptoms of hyper/hypo-glycemia.  Outcome: Completed/Met Date Met:  06/03/13 Patient currently not on ant prescribed medication for diabetes nor is she having blood glucose checked, patient and daughters aware of foods to avoid and s/s to report to MD.

## 2013-06-03 NOTE — Progress Notes (Signed)
  Subjective: Going home today. We had followed her wounds in the hospital. Asked to evaluate wound drainage and CX showing staph and pseudomonas.  Objective: Vital signs in last 24 hours: Temp:  [97.4 F (36.3 C)-98 F (36.7 C)] 97.4 F (36.3 C) (10/06 0544) Pulse Rate:  [69-82] 69 (10/06 0544) Resp:  [16-18] 16 (10/06 0544) BP: (107-114)/(61-65) 107/61 mmHg (10/06 0544) SpO2:  [95 %-97 %] 97 % (10/06 0544) Last BM Date: 05/31/13  Intake/Output from previous day: 10/05 0701 - 10/06 0700 In: 200 [P.O.:200] Out: -  Intake/Output this shift: Total I/O In: 240 [P.O.:240] Out: -   L posterior calf wound with skin graft, decent take, drainage with blue-green tinge, no cellulitis  Lab Results:   Recent Labs  06/02/13 0540  WBC 8.6  HGB 11.0*  HCT 34.1*  PLT 374   BMET  Recent Labs  06/02/13 0540  NA 143  K 4.1  CL 106  CO2 27  GLUCOSE 98  BUN 8  CREATININE 0.44*  CALCIUM 8.5   PT/INR No results found for this basename: LABPROT, INR,  in the last 72 hours ABG No results found for this basename: PHART, PCO2, PO2, HCO3,  in the last 72 hours  Studies/Results: Dg Tibia/fibula Left  06/01/2013   *RADIOLOGY REPORT*  Clinical Data: Post recent melanoma removal from the lower leg, now with diffuse pain, evaluate for osteomyelitis.  LEFT TIBIA AND FIBULA - 2 VIEW  Comparison: None.  Findings:  There is a large soft tissue defect involving the subcutaneous tissues of the posterior lateral calf.  This finding is without associated radiopaque foreign body or discrete area of osteolysis to suggest osteomyelitis.  No fracture.  Post replacement of the medial compartment of the left knee without evidence of hardware failure or loosening.  Limited visualization of the ankle is normal given obliquity.  IMPRESSION: Large soft tissue defect involving the subcutaneous tissues of the posterior lateral calf without associated evidence of osteomyelitis.   Original Report Authenticated  By: Tacey Ruiz, MD    Anti-infectives: Anti-infectives   Start     Dose/Rate Route Frequency Ordered Stop   06/03/13 0830  ciprofloxacin (CIPRO) tablet 250 mg     250 mg Oral 2 times daily 06/03/13 0820     06/01/13 0645  ceFAZolin (ANCEF) IVPB 1 g/50 mL premix  Status:  Discontinued     1 g 100 mL/hr over 30 Minutes Intravenous 3 times per day 06/01/13 1478 06/02/13 0754      Assessment/Plan: STSG site S/P wide excision melanoma with likely wound colonization with staph and pseudomonas. The blue drainage is likely from the pseudomonas, though a bit may be due to residual methylene blue. Recommend Cipro PO and continue BID dressing changes. She is seeing her surgeon in 2 days so he can re-evaluate at that time. I D/W Rinaldo Cloud, PA from [facility].   LOS: 11 days    Cabrini Ruggieri E 06/03/2013

## 2013-06-04 LAB — WOUND CULTURE

## 2013-06-04 NOTE — Progress Notes (Signed)
Physical Therapy Discharge Summary  Patient Details  Name: Melanie Wood MRN: [id number] Date of Birth: 01-12-1930  Today's Date: 06/04/2013    Patient has met 6 of 6 long term goals due to improved activity tolerance, improved balance, improved postural control, increased strength, ability to compensate for deficits, functional use of  right upper extremity and right lower extremity, improved attention and improved awareness.  Patient to discharge at an ambulatory level Modified Independent.   Patient's care partner is independent to provide the necessary prn assistance at discharge.  Reasons goals not met: n/a  Recommendation:  Patient will benefit from ongoing skilled PT services in ALF to continue to advance safe functional mobility, address ongoing impairments in high level balance, activity tolerance, and minimize fall risk.  Equipment: No equipment provided  Reasons for discharge: treatment goals met and discharge from hospital  Patient/family agrees with progress made and goals achieved: Yes  PT Discharge Precautions/Restrictions Precautions Precautions: Fall;Other (comment) Precaution Comments: recent Lcalf surgery for melanoma, requiring lymph node removal and skin graft from thigh Restrictions Weight Bearing Restrictions: No    Vision/Perception  Vision - Assessment Eye Alignment: Within Functional Limits Ocular Range of Motion: Within Functional Limits Tracking/Visual Pursuits: Able to track stimulus in all quads without difficulty Perception Perception: Within Functional Limits Praxis Praxis: Intact  Cognition Overall Cognitive Status: History of cognitive impairments - at baseline Arousal/Alertness: Awake/alert Orientation Level: Oriented X4 Attention: Selective Selective Attention: Impaired Selective Attention Impairment: Verbal basic;Functional basic Memory: Impaired Memory Impairment: Decreased recall of new information Awareness: Appears  intact Problem Solving: Impaired Problem Solving Impairment: Functional basic Behaviors: Poor frustration tolerance Sensation Sensation Light Touch: Impaired by gross assessment Stereognosis: Appears Intact Hot/Cold: Appears Intact Proprioception: Appears Intact Additional Comments: Decreased light touch L foot Coordination Gross Motor Movements are Fluid and Coordinated: Yes Fine Motor Movements are Fluid and Coordinated: No Finger Nose Finger Test: Mild dysmetria L f Heel Shin Test: Decreased accuracy, excursion, and rhythm, slightly improved over admission, limited by pain Motor  Motor Motor: Ataxia Motor - Skilled Clinical Observations: generalized weakness and decreased coordination with rapid alternating movements  Mobility Bed Mobility Bed Mobility: Rolling Right (modified independent for all) Transfers Transfers: Yes Stand Pivot Transfers: 6: Modified independent (Device/Increase time) Locomotion  Ambulation Ambulation: Yes Ambulation/Gait Assistance: 6: Modified independent (Device/Increase time) Assistive device: Rolling walker Gait Gait: Yes Gait Pattern: Step-through pattern;Trunk flexed (some hip IR LLE due to previous THR) Gait velocity: slow High Level Ambulation High Level Ambulation: Side stepping;Backwards walking Stairs / Additional Locomotion Stairs: No Corporate treasurer: Yes Wheelchair Assistance: 5: Investment banker, operational: Both upper extremities Wheelchair Parts Management: Needs assistance  Trunk/Postural Assessment  Cervical Assessment Cervical Assessment: Within Functional Limits Cervical Strength Overall Cervical Strength Comments: Pt c/o neck pain at end of tx due to fatigue from unsupported sitting during eval Thoracic Assessment Thoracic Assessment: Within Functional Limits Lumbar Assessment Lumbar Assessment: Within Functional Limits Postural Control Postural Control: Deficits on evaluation Righting  Reactions: Delayed Postural Limitations: improved since admission, but still with decreased postural endurance, fatigue from unsuppported sitting; pt has hx low back pain due to stenosis  Balance Balance Balance Assessed: Yes Static Sitting Balance Static Sitting - Level of Assistance: 7: Independent Dynamic Sitting Balance Dynamic Sitting - Level of Assistance: 6: Modified independent (Device/Increase time) Static Standing Balance Static Standing - Level of Assistance: 6: Modified independent (Device/Increase time) Dynamic Standing Balance Dynamic Standing - Level of Assistance: 6: Modified independent (Device/Increase time) Extremity Assessment  RUE Assessment RUE  Assessment: Within Functional Limits LUE Assessment LUE Assessment: Within Functional Limits RLE Assessment RLE Assessment: Within Functional Limits LLE Assessment LLE Assessment: Within Functional Limits  See FIM for current functional status  Clydene Laming, PT, DPT  06/04/2013, 4:07 PM

## 2013-06-18 ENCOUNTER — Ambulatory Visit: Payer: Self-pay | Admitting: Oncology

## 2013-06-24 ENCOUNTER — Ambulatory Visit: Payer: Self-pay | Admitting: Oncology

## 2013-06-25 ENCOUNTER — Inpatient Hospital Stay: Payer: Medicare Other | Admitting: Physical Medicine & Rehabilitation

## 2013-06-29 ENCOUNTER — Ambulatory Visit: Payer: Self-pay | Admitting: Oncology

## 2013-07-11 ENCOUNTER — Ambulatory Visit: Payer: Self-pay | Admitting: Surgery

## 2013-07-17 ENCOUNTER — Telehealth: Payer: Self-pay

## 2013-07-17 LAB — CBC CANCER CENTER
Basophil %: 0.4 %
Eosinophil #: 0.2 x10 3/mm (ref 0.0–0.7)
Eosinophil %: 3.1 %
Lymphocyte #: 1 x10 3/mm (ref 1.0–3.6)
Lymphocyte %: 12.2 %
MCH: 26.7 pg (ref 26.0–34.0)
MCHC: 31.5 g/dL — ABNORMAL LOW (ref 32.0–36.0)
Monocyte #: 0.6 x10 3/mm (ref 0.2–0.9)
Monocyte %: 7.3 %
Neutrophil %: 77 %
RBC: 3.94 10*6/uL (ref 3.80–5.20)
RDW: 16.2 % — ABNORMAL HIGH (ref 11.5–14.5)
WBC: 7.8 x10 3/mm (ref 3.6–11.0)

## 2013-07-17 NOTE — Telephone Encounter (Signed)
Ok to extend ST

## 2013-07-17 NOTE — Telephone Encounter (Signed)
Left message advising Kathy ok to extend speech therapy. 

## 2013-07-17 NOTE — Telephone Encounter (Signed)
Olegario Messier with [facility] called to get verbal to extend patients therapy.  Please advise.

## 2013-07-24 LAB — CBC CANCER CENTER
Basophil %: 0.7 %
Eosinophil #: 0.2 x10 3/mm (ref 0.0–0.7)
HCT: 32.3 % — ABNORMAL LOW (ref 35.0–47.0)
HGB: 10.1 g/dL — ABNORMAL LOW (ref 12.0–16.0)
Lymphocyte #: 0.6 x10 3/mm — ABNORMAL LOW (ref 1.0–3.6)
MCV: 85 fL (ref 80–100)
Monocyte #: 0.6 x10 3/mm (ref 0.2–0.9)
Neutrophil %: 75.6 %
RDW: 17 % — ABNORMAL HIGH (ref 11.5–14.5)
WBC: 6.1 x10 3/mm (ref 3.6–11.0)

## 2013-07-29 ENCOUNTER — Ambulatory Visit: Payer: Self-pay | Admitting: Oncology

## 2013-07-31 LAB — CBC CANCER CENTER
Basophil #: 0.1 x10 3/mm (ref 0.0–0.1)
Basophil %: 0.8 %
Eosinophil #: 0.2 x10 3/mm (ref 0.0–0.7)
Eosinophil %: 3.2 %
HCT: 32.3 % — ABNORMAL LOW (ref 35.0–47.0)
HGB: 10.2 g/dL — ABNORMAL LOW (ref 12.0–16.0)
Lymphocyte #: 0.6 x10 3/mm — ABNORMAL LOW (ref 1.0–3.6)
Lymphocyte %: 9.5 %
MCH: 26.3 pg (ref 26.0–34.0)
MCHC: 31.5 g/dL — ABNORMAL LOW (ref 32.0–36.0)
MCV: 84 fL (ref 80–100)
Monocyte #: 0.6 x10 3/mm (ref 0.2–0.9)
Monocyte %: 10.4 %
Neutrophil #: 4.7 x10 3/mm (ref 1.4–6.5)
Neutrophil %: 76.1 %
Platelet: 251 x10 3/mm (ref 150–440)
RBC: 3.86 10*6/uL (ref 3.80–5.20)
RDW: 17.4 % — ABNORMAL HIGH (ref 11.5–14.5)
WBC: 6.2 x10 3/mm (ref 3.6–11.0)

## 2013-08-02 ENCOUNTER — Telehealth: Payer: Self-pay

## 2013-08-02 NOTE — Telephone Encounter (Signed)
Verbal given to Olegario Messier to recert patient speech therapy.

## 2013-08-02 NOTE — Telephone Encounter (Signed)
Okay to recert  speech therapy

## 2013-08-02 NOTE — Telephone Encounter (Signed)
Olegario Messier from [facility] called requesting a verbal order to re certify patient for speech therapy to start on 08/04/13.

## 2013-08-07 LAB — CBC CANCER CENTER
Eosinophil #: 0.1 x10 3/mm (ref 0.0–0.7)
Eosinophil %: 1.6 %
HGB: 10.1 g/dL — ABNORMAL LOW (ref 12.0–16.0)
Lymphocyte #: 0.5 x10 3/mm — ABNORMAL LOW (ref 1.0–3.6)
MCH: 25.6 pg — ABNORMAL LOW (ref 26.0–34.0)
Monocyte #: 0.5 x10 3/mm (ref 0.2–0.9)
Neutrophil %: 82.8 %
RBC: 3.96 10*6/uL (ref 3.80–5.20)
RDW: 17.4 % — ABNORMAL HIGH (ref 11.5–14.5)
WBC: 6.9 x10 3/mm (ref 3.6–11.0)

## 2013-08-14 LAB — CBC CANCER CENTER
Eosinophil #: 0.1 x10 3/mm (ref 0.0–0.7)
HGB: 10 g/dL — ABNORMAL LOW (ref 12.0–16.0)
Lymphocyte %: 7.6 %
MCHC: 31.3 g/dL — ABNORMAL LOW (ref 32.0–36.0)
MCV: 81 fL (ref 80–100)
Monocyte #: 0.6 x10 3/mm (ref 0.2–0.9)
Monocyte %: 10.3 %
Neutrophil %: 79.4 %
Platelet: 270 x10 3/mm (ref 150–440)
RBC: 3.94 10*6/uL (ref 3.80–5.20)
WBC: 6.2 x10 3/mm (ref 3.6–11.0)

## 2013-08-15 ENCOUNTER — Telehealth: Payer: Self-pay

## 2013-08-15 NOTE — Telephone Encounter (Signed)
ok 

## 2013-08-15 NOTE — Telephone Encounter (Signed)
Melanie Wood from [facility] is requesting a verbal order to continue patient's Speech Therapy 2 times a week for 3 more weeks. Is this okay?

## 2013-08-16 NOTE — Telephone Encounter (Signed)
Informed Melanie Wood per Dr Wynn Banker ok to continue speech therapy.

## 2013-08-21 LAB — CBC CANCER CENTER
Basophil %: 0.8 %
HCT: 33.4 % — ABNORMAL LOW (ref 35.0–47.0)
HGB: 10.2 g/dL — ABNORMAL LOW (ref 12.0–16.0)
Lymphocyte #: 0.4 x10 3/mm — ABNORMAL LOW (ref 1.0–3.6)
MCH: 24.8 pg — ABNORMAL LOW (ref 26.0–34.0)
MCV: 81 fL (ref 80–100)
Monocyte #: 0.7 x10 3/mm (ref 0.2–0.9)
Monocyte %: 9.3 %
RBC: 4.11 10*6/uL (ref 3.80–5.20)
WBC: 7.1 x10 3/mm (ref 3.6–11.0)

## 2013-08-29 ENCOUNTER — Ambulatory Visit: Payer: Self-pay | Admitting: Oncology

## 2013-09-23 ENCOUNTER — Emergency Department: Payer: Self-pay | Admitting: Emergency Medicine

## 2013-09-23 LAB — URINALYSIS, COMPLETE
BACTERIA: NONE SEEN
Bacteria: NEGATIVE
Bilirubin,UR: NEGATIVE
Blood: NEGATIVE
GLUCOSE, UR: NEGATIVE mg/dL (ref 0–75)
Ketone: NEGATIVE
Leukocyte Esterase: NEGATIVE
NITRITE: NEGATIVE
PH: 6 (ref 4.5–8.0)
Protein: 100
RBC,UR: 4 /HPF (ref 0–5)
SPECIFIC GRAVITY: 1.024 (ref 1.003–1.030)
Specific Gravity: 1.021 (ref 1.003–1.030)
Squamous Epithelial: 4
WBC UR: 2 /HPF (ref 0–5)

## 2013-09-23 LAB — BASIC METABOLIC PANEL
ANION GAP: 4 — AB (ref 7–16)
BUN: 18 mg/dL (ref 7–18)
CO2: 29 mmol/L (ref 21–32)
CREATININE: 0.61 mg/dL (ref 0.60–1.30)
Calcium, Total: 8.2 mg/dL — ABNORMAL LOW (ref 8.5–10.1)
Chloride: 103 mmol/L (ref 98–107)
EGFR (African American): >60
EGFR (Non-African Amer.): >60
GLUCOSE: 119 mg/dL — AB (ref 65–99)
Osmolality: 275 (ref 275–301)
Potassium: 3.5 mmol/L (ref 3.5–5.1)
Sodium: 136 mmol/L (ref 136–145)

## 2013-09-23 LAB — CBC WITH DIFFERENTIAL/PLATELET
BASOS PCT: 0.2 %
Basophil #: 0 10*3/uL (ref 0.0–0.1)
EOS ABS: 0 10*3/uL (ref 0.0–0.7)
Eosinophil %: 0.2 %
HCT: 32.9 % — AB (ref 35.0–47.0)
HGB: 10.5 g/dL — AB (ref 12.0–16.0)
Lymphocyte #: 0.2 10*3/uL — ABNORMAL LOW (ref 1.0–3.6)
Lymphocyte %: 2 %
MCH: 24.8 pg — ABNORMAL LOW (ref 26.0–34.0)
MCHC: 31.8 g/dL — AB (ref 32.0–36.0)
MCV: 78 fL — AB (ref 80–100)
Monocyte #: 0.3 x10 3/mm (ref 0.2–0.9)
Monocyte %: 2.8 %
Neutrophil #: 8.8 10*3/uL — ABNORMAL HIGH (ref 1.4–6.5)
Neutrophil %: 94.8 %
Platelet: 243 10*3/uL (ref 150–440)
RBC: 4.23 10*6/uL (ref 3.80–5.20)
RDW: 19 % — ABNORMAL HIGH (ref 11.5–14.5)
WBC: 9.3 10*3/uL (ref 3.6–11.0)

## 2013-09-23 LAB — TROPONIN I: Troponin-I: <0.02 ng/mL

## 2013-09-27 ENCOUNTER — Inpatient Hospital Stay: Payer: Self-pay

## 2013-09-27 LAB — URINALYSIS, COMPLETE
BILIRUBIN, UR: NEGATIVE
Bacteria: NONE SEEN
GLUCOSE, UR: NEGATIVE mg/dL (ref 0–75)
Ketone: NEGATIVE
LEUKOCYTE ESTERASE: NEGATIVE
NITRITE: NEGATIVE
Ph: 6 (ref 4.5–8.0)
Protein: 100
RBC,UR: 4 /HPF (ref 0–5)
Specific Gravity: 1.013 (ref 1.003–1.030)
Squamous Epithelial: <1

## 2013-09-27 LAB — COMPREHENSIVE METABOLIC PANEL
ALK PHOS: 93 U/L
ANION GAP: 7 (ref 7–16)
Albumin: 3.5 g/dL (ref 3.4–5.0)
BILIRUBIN TOTAL: 0.7 mg/dL (ref 0.2–1.0)
BUN: 12 mg/dL (ref 7–18)
CO2: 29 mmol/L (ref 21–32)
Calcium, Total: 8.4 mg/dL — ABNORMAL LOW (ref 8.5–10.1)
Chloride: 100 mmol/L (ref 98–107)
Creatinine: 0.75 mg/dL (ref 0.60–1.30)
EGFR (African American): >60
EGFR (Non-African Amer.): >60
GLUCOSE: 124 mg/dL — AB (ref 65–99)
OSMOLALITY: 273 (ref 275–301)
POTASSIUM: 3.3 mmol/L — AB (ref 3.5–5.1)
SGOT(AST): 29 U/L (ref 15–37)
SGPT (ALT): 29 U/L (ref 12–78)
Sodium: 136 mmol/L (ref 136–145)
TOTAL PROTEIN: 7.9 g/dL (ref 6.4–8.2)

## 2013-09-27 LAB — CBC
HCT: 33.6 % — AB (ref 35.0–47.0)
HGB: 10.5 g/dL — AB (ref 12.0–16.0)
MCH: 24.5 pg — AB (ref 26.0–34.0)
MCHC: 31.3 g/dL — ABNORMAL LOW (ref 32.0–36.0)
MCV: 78 fL — ABNORMAL LOW (ref 80–100)
Platelet: 280 10*3/uL (ref 150–440)
RBC: 4.29 10*6/uL (ref 3.80–5.20)
RDW: 19.5 % — AB (ref 11.5–14.5)
WBC: 10.6 10*3/uL (ref 3.6–11.0)

## 2013-09-27 LAB — TROPONIN I

## 2013-09-28 LAB — BASIC METABOLIC PANEL
Anion Gap: 4 — ABNORMAL LOW (ref 7–16)
BUN: 10 mg/dL (ref 7–18)
CALCIUM: 7.9 mg/dL — AB (ref 8.5–10.1)
CHLORIDE: 103 mmol/L (ref 98–107)
Co2: 30 mmol/L (ref 21–32)
Creatinine: 0.6 mg/dL (ref 0.60–1.30)
EGFR (African American): >60
EGFR (Non-African Amer.): >60
Glucose: 99 mg/dL (ref 65–99)
Osmolality: 273 (ref 275–301)
Potassium: 3.1 mmol/L — ABNORMAL LOW (ref 3.5–5.1)
SODIUM: 137 mmol/L (ref 136–145)

## 2013-09-28 LAB — CBC WITH DIFFERENTIAL/PLATELET
BASOS PCT: 0.8 %
Basophil #: 0 10*3/uL (ref 0.0–0.1)
EOS PCT: 0.8 %
Eosinophil #: 0 10*3/uL (ref 0.0–0.7)
HCT: 26.9 % — ABNORMAL LOW (ref 35.0–47.0)
HGB: 8.9 g/dL — AB (ref 12.0–16.0)
Lymphocyte #: 0.2 10*3/uL — ABNORMAL LOW (ref 1.0–3.6)
Lymphocyte %: 4.3 %
MCH: 25.8 pg — AB (ref 26.0–34.0)
MCHC: 33 g/dL (ref 32.0–36.0)
MCV: 78 fL — ABNORMAL LOW (ref 80–100)
Monocyte #: 0.2 x10 3/mm (ref 0.2–0.9)
Monocyte %: 4.4 %
Neutrophil #: 4.9 10*3/uL (ref 1.4–6.5)
Neutrophil %: 89.7 %
PLATELETS: 202 10*3/uL (ref 150–440)
RBC: 3.44 10*6/uL — ABNORMAL LOW (ref 3.80–5.20)
RDW: 19.2 % — AB (ref 11.5–14.5)
WBC: 5.4 10*3/uL (ref 3.6–11.0)

## 2013-09-29 LAB — CBC WITH DIFFERENTIAL/PLATELET
Basophil #: 0 10*3/uL (ref 0.0–0.1)
Basophil %: 0.3 %
Eosinophil #: 0 10*3/uL (ref 0.0–0.7)
Eosinophil %: 0.1 %
HCT: 26.1 % — ABNORMAL LOW (ref 35.0–47.0)
HGB: 8.4 g/dL — AB (ref 12.0–16.0)
LYMPHS ABS: 0.2 10*3/uL — AB (ref 1.0–3.6)
Lymphocyte %: 9.3 %
MCH: 24.9 pg — ABNORMAL LOW (ref 26.0–34.0)
MCHC: 32 g/dL (ref 32.0–36.0)
MCV: 78 fL — ABNORMAL LOW (ref 80–100)
MONO ABS: 0.4 x10 3/mm (ref 0.2–0.9)
Monocyte %: 15.6 %
Neutrophil #: 2 10*3/uL (ref 1.4–6.5)
Neutrophil %: 74.7 %
Platelet: 202 10*3/uL (ref 150–440)
RBC: 3.36 10*6/uL — ABNORMAL LOW (ref 3.80–5.20)
RDW: 19.7 % — AB (ref 11.5–14.5)
WBC: 2.7 10*3/uL — AB (ref 3.6–11.0)

## 2013-09-29 LAB — BASIC METABOLIC PANEL
Anion Gap: 3 — ABNORMAL LOW (ref 7–16)
BUN: 13 mg/dL (ref 7–18)
CO2: 29 mmol/L (ref 21–32)
Calcium, Total: 8.3 mg/dL — ABNORMAL LOW (ref 8.5–10.1)
Chloride: 108 mmol/L — ABNORMAL HIGH (ref 98–107)
Creatinine: 0.56 mg/dL — ABNORMAL LOW (ref 0.60–1.30)
EGFR (African American): >60
EGFR (Non-African Amer.): >60
Glucose: 114 mg/dL — ABNORMAL HIGH (ref 65–99)
Osmolality: 280 (ref 275–301)
Potassium: 4 mmol/L (ref 3.5–5.1)
Sodium: 140 mmol/L (ref 136–145)

## 2013-09-29 LAB — MAGNESIUM: Magnesium: 2.2 mg/dL

## 2013-09-30 LAB — COMPREHENSIVE METABOLIC PANEL
ALT: 27 U/L (ref 12–78)
Albumin: 2.7 g/dL — ABNORMAL LOW (ref 3.4–5.0)
Alkaline Phosphatase: 66 U/L
Anion Gap: 4 — ABNORMAL LOW (ref 7–16)
BUN: 19 mg/dL — AB (ref 7–18)
Bilirubin,Total: 0.3 mg/dL (ref 0.2–1.0)
CREATININE: 0.66 mg/dL (ref 0.60–1.30)
Calcium, Total: 8.6 mg/dL (ref 8.5–10.1)
Chloride: 106 mmol/L (ref 98–107)
Co2: 27 mmol/L (ref 21–32)
EGFR (African American): >60
EGFR (Non-African Amer.): >60
Glucose: 105 mg/dL — ABNORMAL HIGH (ref 65–99)
Osmolality: 276 (ref 275–301)
Potassium: 3.8 mmol/L (ref 3.5–5.1)
SGOT(AST): 41 U/L — ABNORMAL HIGH (ref 15–37)
SODIUM: 137 mmol/L (ref 136–145)
TOTAL PROTEIN: 6.6 g/dL (ref 6.4–8.2)

## 2013-09-30 LAB — CBC WITH DIFFERENTIAL/PLATELET
Basophil #: 0 10*3/uL (ref 0.0–0.1)
Basophil %: 0.4 %
EOS PCT: 0.3 %
Eosinophil #: 0 10*3/uL (ref 0.0–0.7)
HCT: 27.8 % — ABNORMAL LOW (ref 35.0–47.0)
HGB: 8.8 g/dL — ABNORMAL LOW (ref 12.0–16.0)
LYMPHS ABS: 0.3 10*3/uL — AB (ref 1.0–3.6)
Lymphocyte %: 8.6 %
MCH: 24.7 pg — ABNORMAL LOW (ref 26.0–34.0)
MCHC: 31.8 g/dL — ABNORMAL LOW (ref 32.0–36.0)
MCV: 78 fL — AB (ref 80–100)
Monocyte #: 0.4 x10 3/mm (ref 0.2–0.9)
Monocyte %: 11.4 %
Neutrophil #: 3 10*3/uL (ref 1.4–6.5)
Neutrophil %: 79.3 %
PLATELETS: 233 10*3/uL (ref 150–440)
RBC: 3.58 10*6/uL — AB (ref 3.80–5.20)
RDW: 19.6 % — ABNORMAL HIGH (ref 11.5–14.5)
WBC: 3.8 10*3/uL (ref 3.6–11.0)

## 2013-09-30 LAB — PRO B NATRIURETIC PEPTIDE: B-Type Natriuretic Peptide: 2023 pg/mL — ABNORMAL HIGH (ref 0–450)

## 2013-10-02 LAB — CULTURE, BLOOD (SINGLE)

## 2013-10-22 ENCOUNTER — Ambulatory Visit: Payer: Self-pay | Admitting: Oncology

## 2013-10-23 ENCOUNTER — Ambulatory Visit: Payer: Self-pay | Admitting: Oncology

## 2013-10-23 LAB — COMPREHENSIVE METABOLIC PANEL
ALBUMIN: 3.2 g/dL — AB (ref 3.4–5.0)
ALK PHOS: 77 U/L
Anion Gap: 6 — ABNORMAL LOW (ref 7–16)
BUN: 12 mg/dL (ref 7–18)
Bilirubin,Total: 0.5 mg/dL (ref 0.2–1.0)
Calcium, Total: 7.9 mg/dL — ABNORMAL LOW (ref 8.5–10.1)
Chloride: 105 mmol/L (ref 98–107)
Co2: 31 mmol/L (ref 21–32)
Creatinine: 0.58 mg/dL — ABNORMAL LOW (ref 0.60–1.30)
GLUCOSE: 113 mg/dL — AB (ref 65–99)
OSMOLALITY: 284 (ref 275–301)
POTASSIUM: 4.2 mmol/L (ref 3.5–5.1)
SGOT(AST): 26 U/L (ref 15–37)
SGPT (ALT): 23 U/L (ref 12–78)
Sodium: 142 mmol/L (ref 136–145)
Total Protein: 7 g/dL (ref 6.4–8.2)

## 2013-10-23 LAB — LACTATE DEHYDROGENASE: LDH: 199 U/L (ref 81–246)

## 2013-10-23 LAB — CBC CANCER CENTER
Basophil #: 0 x10 3/mm (ref 0.0–0.1)
Basophil %: 0.4 %
Eosinophil #: 0.2 x10 3/mm (ref 0.0–0.7)
Eosinophil %: 2.9 %
HCT: 34.2 % — AB (ref 35.0–47.0)
HGB: 10.4 g/dL — AB (ref 12.0–16.0)
LYMPHS ABS: 0.5 x10 3/mm — AB (ref 1.0–3.6)
LYMPHS PCT: 8.2 %
MCH: 23.3 pg — AB (ref 26.0–34.0)
MCHC: 30.5 g/dL — AB (ref 32.0–36.0)
MCV: 76 fL — ABNORMAL LOW (ref 80–100)
Monocyte #: 0.4 x10 3/mm (ref 0.2–0.9)
Monocyte %: 6.6 %
NEUTROS ABS: 4.8 x10 3/mm (ref 1.4–6.5)
NEUTROS PCT: 81.9 %
PLATELETS: 231 x10 3/mm (ref 150–440)
RBC: 4.48 10*6/uL (ref 3.80–5.20)
RDW: 20.4 % — ABNORMAL HIGH (ref 11.5–14.5)
WBC: 5.9 x10 3/mm (ref 3.6–11.0)

## 2013-10-27 ENCOUNTER — Ambulatory Visit: Payer: Self-pay | Admitting: Oncology

## 2013-11-27 ENCOUNTER — Ambulatory Visit: Payer: Self-pay | Admitting: Oncology

## 2014-01-02 ENCOUNTER — Ambulatory Visit: Payer: Self-pay | Admitting: Oncology

## 2014-01-02 LAB — FERRITIN: Ferritin (ARMC): 35 ng/mL (ref 8–388)

## 2014-01-02 LAB — IRON AND TIBC
IRON BIND. CAP.(TOTAL): 539 ug/dL — AB (ref 250–450)
IRON SATURATION: 5 %
Iron: 25 ug/dL — ABNORMAL LOW (ref 50–170)
Unbound Iron-Bind.Cap.: 514 ug/dL

## 2014-01-10 LAB — BASIC METABOLIC PANEL
ANION GAP: 6 — AB (ref 7–16)
BUN: 13 mg/dL (ref 7–18)
Calcium, Total: 7.9 mg/dL — ABNORMAL LOW (ref 8.5–10.1)
Chloride: 103 mmol/L (ref 98–107)
Co2: 32 mmol/L (ref 21–32)
Creatinine: 0.64 mg/dL (ref 0.60–1.30)
EGFR (Non-African Amer.): >60
GLUCOSE: 153 mg/dL — AB (ref 65–99)
OSMOLALITY: 284 (ref 275–301)
POTASSIUM: 3.9 mmol/L (ref 3.5–5.1)
SODIUM: 141 mmol/L (ref 136–145)

## 2014-01-10 LAB — CBC CANCER CENTER
BASOS ABS: 0 x10 3/mm (ref 0.0–0.1)
Basophil %: 0.1 %
EOS ABS: 0.1 x10 3/mm (ref 0.0–0.7)
Eosinophil %: 1.2 %
HCT: 31.1 % — AB (ref 35.0–47.0)
HGB: 9.5 g/dL — ABNORMAL LOW (ref 12.0–16.0)
Lymphocyte #: 0.3 x10 3/mm — ABNORMAL LOW (ref 1.0–3.6)
Lymphocyte %: 4.1 %
MCH: 22.3 pg — AB (ref 26.0–34.0)
MCHC: 30.6 g/dL — AB (ref 32.0–36.0)
MCV: 73 fL — ABNORMAL LOW (ref 80–100)
Monocyte #: 0.4 x10 3/mm (ref 0.2–0.9)
Monocyte %: 6.1 %
Neutrophil #: 6.2 x10 3/mm (ref 1.4–6.5)
Neutrophil %: 88.5 %
Platelet: 248 x10 3/mm (ref 150–440)
RBC: 4.26 10*6/uL (ref 3.80–5.20)
RDW: 24.2 % — AB (ref 11.5–14.5)
WBC: 7 x10 3/mm (ref 3.6–11.0)

## 2014-01-27 ENCOUNTER — Ambulatory Visit: Payer: Self-pay | Admitting: Oncology

## 2014-01-28 LAB — CBC CANCER CENTER
Basophil #: 0 x10 3/mm (ref 0.0–0.1)
Basophil %: 0 %
Eosinophil #: 0 x10 3/mm (ref 0.0–0.7)
Eosinophil %: 0 %
HCT: 35 % (ref 35.0–47.0)
HGB: 10.6 g/dL — ABNORMAL LOW (ref 12.0–16.0)
Lymphocyte #: 0.1 x10 3/mm — ABNORMAL LOW (ref 1.0–3.6)
Lymphocyte %: 1.7 %
MCH: 24 pg — AB (ref 26.0–34.0)
MCHC: 30.3 g/dL — ABNORMAL LOW (ref 32.0–36.0)
MCV: 79 fL — ABNORMAL LOW (ref 80–100)
Monocyte #: 0 x10 3/mm — ABNORMAL LOW (ref 0.2–0.9)
Monocyte %: 0.3 %
NEUTROS PCT: 98 %
Neutrophil #: 5.4 x10 3/mm (ref 1.4–6.5)
Platelet: 200 x10 3/mm (ref 150–440)
RBC: 4.42 10*6/uL (ref 3.80–5.20)
RDW: 28.8 % — AB (ref 11.5–14.5)
WBC: 5.5 x10 3/mm (ref 3.6–11.0)

## 2014-01-28 LAB — BASIC METABOLIC PANEL
Anion Gap: 6 — ABNORMAL LOW (ref 7–16)
BUN: 14 mg/dL (ref 7–18)
CO2: 32 mmol/L (ref 21–32)
CREATININE: 0.78 mg/dL (ref 0.60–1.30)
Calcium, Total: 8.8 mg/dL (ref 8.5–10.1)
Chloride: 100 mmol/L (ref 98–107)
Glucose: 254 mg/dL — ABNORMAL HIGH (ref 65–99)
Osmolality: 285 (ref 275–301)
Potassium: 4.4 mmol/L (ref 3.5–5.1)
Sodium: 138 mmol/L (ref 136–145)

## 2014-01-28 LAB — IRON AND TIBC
IRON: 37 ug/dL — AB (ref 50–170)
Iron Bind.Cap.(Total): 545 ug/dL — ABNORMAL HIGH (ref 250–450)
Iron Saturation: 7 %
UNBOUND IRON-BIND. CAP.: 508 ug/dL

## 2014-01-28 LAB — FERRITIN: Ferritin (ARMC): 69 ng/mL (ref 8–388)

## 2014-02-26 ENCOUNTER — Ambulatory Visit: Payer: Self-pay | Admitting: Oncology

## 2014-03-22 LAB — CBC
HCT: 40.7 % (ref 35.0–47.0)
HGB: 12.8 g/dL (ref 12.0–16.0)
MCH: 28.2 pg (ref 26.0–34.0)
MCHC: 31.6 g/dL — ABNORMAL LOW (ref 32.0–36.0)
MCV: 90 fL (ref 80–100)
Platelet: 200 10*3/uL (ref 150–440)
RBC: 4.55 10*6/uL (ref 3.80–5.20)
RDW: 24.4 % — ABNORMAL HIGH (ref 11.5–14.5)
WBC: 7.4 10*3/uL (ref 3.6–11.0)

## 2014-03-22 LAB — COMPREHENSIVE METABOLIC PANEL
ALK PHOS: 101 U/L
Albumin: 3.2 g/dL — ABNORMAL LOW (ref 3.4–5.0)
Anion Gap: 5 — ABNORMAL LOW (ref 7–16)
BILIRUBIN TOTAL: 0.8 mg/dL (ref 0.2–1.0)
BUN: 13 mg/dL (ref 7–18)
CO2: 34 mmol/L — AB (ref 21–32)
Calcium, Total: 8.5 mg/dL (ref 8.5–10.1)
Chloride: 99 mmol/L (ref 98–107)
Creatinine: 0.73 mg/dL (ref 0.60–1.30)
GLUCOSE: 112 mg/dL — AB (ref 65–99)
Osmolality: 277 (ref 275–301)
POTASSIUM: 3.6 mmol/L (ref 3.5–5.1)
SGOT(AST): 40 U/L — ABNORMAL HIGH (ref 15–37)
SGPT (ALT): 29 U/L
SODIUM: 138 mmol/L (ref 136–145)
TOTAL PROTEIN: 7.1 g/dL (ref 6.4–8.2)

## 2014-03-22 LAB — PROTIME-INR
INR: 2
Prothrombin Time: 22.6 secs — ABNORMAL HIGH (ref 11.5–14.7)

## 2014-03-23 ENCOUNTER — Observation Stay: Payer: Self-pay

## 2014-03-23 LAB — URINALYSIS, COMPLETE
Bilirubin,UR: NEGATIVE
Glucose,UR: NEGATIVE mg/dL (ref 0–75)
Ketone: NEGATIVE
Nitrite: NEGATIVE
PH: 6 (ref 4.5–8.0)
PROTEIN: NEGATIVE
RBC,UR: 11 /HPF (ref 0–5)
Specific Gravity: 1.014 (ref 1.003–1.030)
WBC UR: 14 /HPF (ref 0–5)

## 2014-03-23 LAB — CK-MB
CK-MB: 3.9 ng/mL — AB (ref 0.5–3.6)
CK-MB: 4.2 ng/mL — ABNORMAL HIGH (ref 0.5–3.6)
CK-MB: 4.5 ng/mL — ABNORMAL HIGH (ref 0.5–3.6)

## 2014-03-23 LAB — TROPONIN I
TROPONIN-I: 0.02 ng/mL
Troponin-I: <0.02 ng/mL
Troponin-I: 0.03 ng/mL

## 2014-03-24 LAB — LIPID PANEL
Cholesterol: 129 mg/dL (ref 0–200)
HDL Cholesterol: 67 mg/dL — ABNORMAL HIGH (ref 40–60)
LDL CHOLESTEROL, CALC: 50 mg/dL (ref 0–100)
Triglycerides: 62 mg/dL (ref 0–200)
VLDL Cholesterol, Calc: 12 mg/dL (ref 5–40)

## 2014-03-24 LAB — BASIC METABOLIC PANEL
ANION GAP: 6 — AB (ref 7–16)
BUN: 18 mg/dL (ref 7–18)
Calcium, Total: 8.4 mg/dL — ABNORMAL LOW (ref 8.5–10.1)
Chloride: 101 mmol/L (ref 98–107)
Co2: 31 mmol/L (ref 21–32)
Creatinine: 0.77 mg/dL (ref 0.60–1.30)
EGFR (African American): >60
EGFR (Non-African Amer.): >60
GLUCOSE: 219 mg/dL — AB (ref 65–99)
Osmolality: 284 (ref 275–301)
Potassium: 3.9 mmol/L (ref 3.5–5.1)
Sodium: 138 mmol/L (ref 136–145)

## 2014-03-24 LAB — TSH: Thyroid Stimulating Horm: 0.568 u[IU]/mL

## 2014-03-24 LAB — MAGNESIUM: MAGNESIUM: 2 mg/dL

## 2014-04-14 ENCOUNTER — Emergency Department: Payer: Self-pay | Admitting: Emergency Medicine

## 2014-04-14 LAB — URINALYSIS, COMPLETE
Bilirubin,UR: NEGATIVE
Glucose,UR: NEGATIVE mg/dL (ref 0–75)
Hyaline Cast: 3
Ketone: NEGATIVE
NITRITE: NEGATIVE
PH: 6 (ref 4.5–8.0)
Protein: NEGATIVE
RBC,UR: 2 /HPF (ref 0–5)
Specific Gravity: 1.006 (ref 1.003–1.030)
WBC UR: 5 /HPF (ref 0–5)

## 2014-04-14 LAB — COMPREHENSIVE METABOLIC PANEL
ALBUMIN: 3.3 g/dL — AB (ref 3.4–5.0)
Alkaline Phosphatase: 89 U/L
Anion Gap: 5 — ABNORMAL LOW (ref 7–16)
BUN: 19 mg/dL — ABNORMAL HIGH (ref 7–18)
Bilirubin,Total: 0.5 mg/dL (ref 0.2–1.0)
CHLORIDE: 100 mmol/L (ref 98–107)
CO2: 33 mmol/L — AB (ref 21–32)
CREATININE: 0.76 mg/dL (ref 0.60–1.30)
Calcium, Total: 8.5 mg/dL (ref 8.5–10.1)
EGFR (African American): >60
Glucose: 185 mg/dL — ABNORMAL HIGH (ref 65–99)
Osmolality: 283 (ref 275–301)
POTASSIUM: 3.3 mmol/L — AB (ref 3.5–5.1)
SGOT(AST): 36 U/L (ref 15–37)
SGPT (ALT): 28 U/L
Sodium: 138 mmol/L (ref 136–145)
Total Protein: 6.8 g/dL (ref 6.4–8.2)

## 2014-04-14 LAB — CBC
HCT: 40.9 % (ref 35.0–47.0)
HGB: 13 g/dL (ref 12.0–16.0)
MCH: 29.1 pg (ref 26.0–34.0)
MCHC: 31.8 g/dL — AB (ref 32.0–36.0)
MCV: 92 fL (ref 80–100)
Platelet: 165 10*3/uL (ref 150–440)
RBC: 4.47 10*6/uL (ref 3.80–5.20)
RDW: 18.8 % — ABNORMAL HIGH (ref 11.5–14.5)
WBC: 6.2 10*3/uL (ref 3.6–11.0)

## 2014-04-14 LAB — CK TOTAL AND CKMB (NOT AT ARMC)
CK, Total: 186 U/L
CK-MB: 4.4 ng/mL — ABNORMAL HIGH (ref 0.5–3.6)

## 2014-04-14 LAB — LIPASE, BLOOD: LIPASE: 433 U/L — AB (ref 73–393)

## 2014-04-14 LAB — TROPONIN I: Troponin-I: 0.02 ng/mL

## 2014-04-15 LAB — URINE CULTURE

## 2014-05-06 ENCOUNTER — Ambulatory Visit: Payer: Self-pay | Admitting: Oncology

## 2014-05-06 LAB — IRON AND TIBC
IRON: 79 ug/dL (ref 50–170)
Iron Bind.Cap.(Total): 380 ug/dL (ref 250–450)
Iron Saturation: 21 %
UNBOUND IRON-BIND. CAP.: 301 ug/dL

## 2014-05-06 LAB — BASIC METABOLIC PANEL
Anion Gap: 6 — ABNORMAL LOW (ref 7–16)
BUN: 13 mg/dL (ref 7–18)
Calcium, Total: 8.4 mg/dL — ABNORMAL LOW (ref 8.5–10.1)
Chloride: 100 mmol/L (ref 98–107)
Co2: 34 mmol/L — ABNORMAL HIGH (ref 21–32)
Creatinine: 0.77 mg/dL (ref 0.60–1.30)
EGFR (African American): >60
EGFR (Non-African Amer.): >60
GLUCOSE: 141 mg/dL — AB (ref 65–99)
Osmolality: 282 (ref 275–301)
POTASSIUM: 3.7 mmol/L (ref 3.5–5.1)
SODIUM: 140 mmol/L (ref 136–145)

## 2014-05-06 LAB — CBC CANCER CENTER
Basophil #: 0 x10 3/mm (ref 0.0–0.1)
Basophil %: 0.5 %
EOS ABS: 0.1 x10 3/mm (ref 0.0–0.7)
Eosinophil %: 1.5 %
HCT: 37.7 % (ref 35.0–47.0)
HGB: 12.4 g/dL (ref 12.0–16.0)
LYMPHS ABS: 0.5 x10 3/mm — AB (ref 1.0–3.6)
Lymphocyte %: 7.9 %
MCH: 29.7 pg (ref 26.0–34.0)
MCHC: 32.8 g/dL (ref 32.0–36.0)
MCV: 91 fL (ref 80–100)
MONO ABS: 0.4 x10 3/mm (ref 0.2–0.9)
Monocyte %: 5.6 %
NEUTROS ABS: 5.8 x10 3/mm (ref 1.4–6.5)
Neutrophil %: 84.5 %
Platelet: 226 x10 3/mm (ref 150–440)
RBC: 4.16 10*6/uL (ref 3.80–5.20)
RDW: 16.4 % — AB (ref 11.5–14.5)
WBC: 6.8 x10 3/mm (ref 3.6–11.0)

## 2014-05-06 LAB — FERRITIN: FERRITIN (ARMC): 177 ng/mL (ref 8–388)

## 2014-05-15 ENCOUNTER — Inpatient Hospital Stay: Payer: Self-pay | Admitting: Specialist

## 2014-05-15 LAB — URINALYSIS, COMPLETE
Bilirubin,UR: NEGATIVE
Glucose,UR: NEGATIVE mg/dL (ref 0–75)
KETONE: NEGATIVE
LEUKOCYTE ESTERASE: NEGATIVE
Nitrite: NEGATIVE
Ph: 6 (ref 4.5–8.0)
RBC,UR: 178 /HPF (ref 0–5)
Specific Gravity: 1.035 (ref 1.003–1.030)
Squamous Epithelial: 8
WBC UR: 15 /HPF (ref 0–5)

## 2014-05-15 LAB — COMPREHENSIVE METABOLIC PANEL
ALBUMIN: 3 g/dL — AB (ref 3.4–5.0)
ALK PHOS: 81 U/L
Anion Gap: 9 (ref 7–16)
BUN: 19 mg/dL — ABNORMAL HIGH (ref 7–18)
Bilirubin,Total: 0.6 mg/dL (ref 0.2–1.0)
CALCIUM: 8.5 mg/dL (ref 8.5–10.1)
CHLORIDE: 101 mmol/L (ref 98–107)
CREATININE: 0.76 mg/dL (ref 0.60–1.30)
Co2: 28 mmol/L (ref 21–32)
EGFR (African American): >60
EGFR (Non-African Amer.): >60
GLUCOSE: 135 mg/dL — AB (ref 65–99)
OSMOLALITY: 280 (ref 275–301)
POTASSIUM: 3.7 mmol/L (ref 3.5–5.1)
SGOT(AST): 32 U/L (ref 15–37)
SGPT (ALT): 21 U/L
Sodium: 138 mmol/L (ref 136–145)
Total Protein: 6.8 g/dL (ref 6.4–8.2)

## 2014-05-15 LAB — CBC WITH DIFFERENTIAL/PLATELET
Bands: 3 %
COMMENT - H1-COM3: NORMAL
HCT: 37.1 % (ref 35.0–47.0)
HGB: 11.8 g/dL — ABNORMAL LOW (ref 12.0–16.0)
Lymphocytes: 4 %
MCH: 29.3 pg (ref 26.0–34.0)
MCHC: 31.8 g/dL — ABNORMAL LOW (ref 32.0–36.0)
MCV: 92 fL (ref 80–100)
METAMYELOCYTE: 1 %
MONOS PCT: 5 %
Platelet: 223 10*3/uL (ref 150–440)
RBC: 4.03 10*6/uL (ref 3.80–5.20)
RDW: 16.5 % — AB (ref 11.5–14.5)
Segmented Neutrophils: 87 %
WBC: 12.8 10*3/uL — AB (ref 3.6–11.0)

## 2014-05-20 LAB — CULTURE, BLOOD (SINGLE)

## 2014-05-29 ENCOUNTER — Ambulatory Visit: Payer: Self-pay | Admitting: Oncology

## 2014-06-03 ENCOUNTER — Ambulatory Visit: Payer: Self-pay | Admitting: Oncology

## 2014-08-13 ENCOUNTER — Ambulatory Visit: Payer: Self-pay | Admitting: Oncology

## 2014-08-13 LAB — CBC CANCER CENTER
Basophil #: 0 x10 3/mm (ref 0.0–0.1)
Basophil %: 0.5 %
EOS ABS: 0.2 x10 3/mm (ref 0.0–0.7)
Eosinophil %: 2.4 %
HCT: 38.6 % (ref 35.0–47.0)
HGB: 12.5 g/dL (ref 12.0–16.0)
Lymphocyte #: 0.6 x10 3/mm — ABNORMAL LOW (ref 1.0–3.6)
Lymphocyte %: 9.1 %
MCH: 30.1 pg (ref 26.0–34.0)
MCHC: 32.4 g/dL (ref 32.0–36.0)
MCV: 93 fL (ref 80–100)
MONO ABS: 0.5 x10 3/mm (ref 0.2–0.9)
MONOS PCT: 6.7 %
Neutrophil #: 5.8 x10 3/mm (ref 1.4–6.5)
Neutrophil %: 81.3 %
PLATELETS: 341 x10 3/mm (ref 150–440)
RBC: 4.16 10*6/uL (ref 3.80–5.20)
RDW: 15 % — AB (ref 11.5–14.5)
WBC: 7.1 x10 3/mm (ref 3.6–11.0)

## 2014-08-13 LAB — COMPREHENSIVE METABOLIC PANEL
ALBUMIN: 1.5 g/dL — AB (ref 3.4–5.0)
ALK PHOS: 101 U/L
Anion Gap: 3 — ABNORMAL LOW (ref 7–16)
BUN: 11 mg/dL (ref 7–18)
Bilirubin,Total: 0.2 mg/dL (ref 0.2–1.0)
CALCIUM: 8.2 mg/dL — AB (ref 8.5–10.1)
Chloride: 102 mmol/L (ref 98–107)
Co2: 32 mmol/L (ref 21–32)
Creatinine: 0.69 mg/dL (ref 0.60–1.30)
EGFR (African American): >60
EGFR (Non-African Amer.): >60
Glucose: 148 mg/dL — ABNORMAL HIGH (ref 65–99)
OSMOLALITY: 276 (ref 275–301)
Potassium: 4.8 mmol/L (ref 3.5–5.1)
SGOT(AST): 36 U/L (ref 15–37)
SGPT (ALT): 26 U/L
SODIUM: 137 mmol/L (ref 136–145)
Total Protein: 6 g/dL — ABNORMAL LOW (ref 6.4–8.2)

## 2014-08-13 LAB — LACTATE DEHYDROGENASE: LDH: 252 U/L — ABNORMAL HIGH (ref 81–246)

## 2014-08-29 ENCOUNTER — Ambulatory Visit: Payer: Self-pay | Admitting: Oncology

## 2014-09-16 IMAGING — US US EXTREM LOW VENOUS*L*
1 series · 14 of 24 positions shown · non-contrast
Comparison: None.

CLINICAL DATA: Left leg swelling

EXAM:
VENOUS DOPPLER ULTRASOUND OF LEFT LOWER EXTREMITY
TECHNIQUE: Gray-scale sonography with graded compression, as well as color
Doppler and duplex ultrasound, were performed to evaluate the deep
venous system from the level of the common femoral vein through the
popliteal and proximal calf veins. Spectral Doppler was utilized to
evaluate flow at rest and with distal augmentation maneuvers.

[Series 1: us extrem low venous*left* · 0.10mm/px · 14 of 44 slices shown]
[im 1/44]
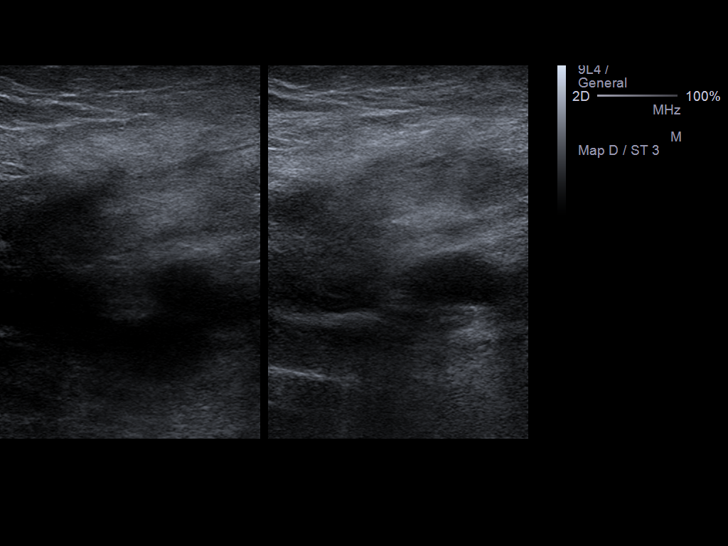
[im 4/44]
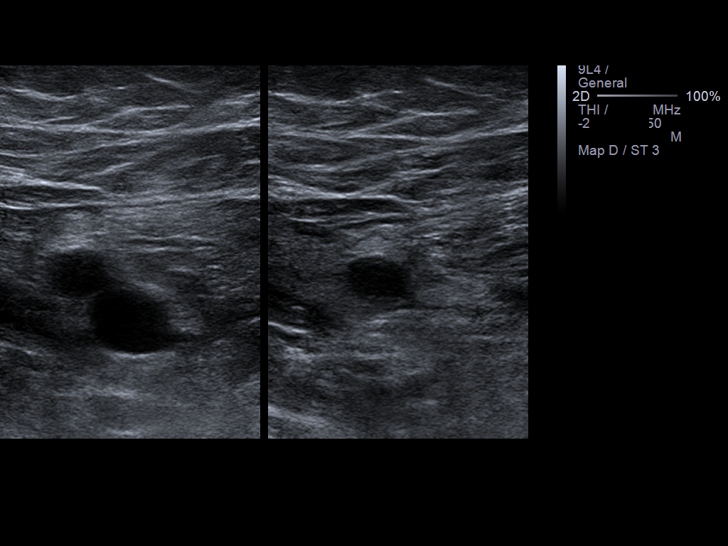
[im 8/44]
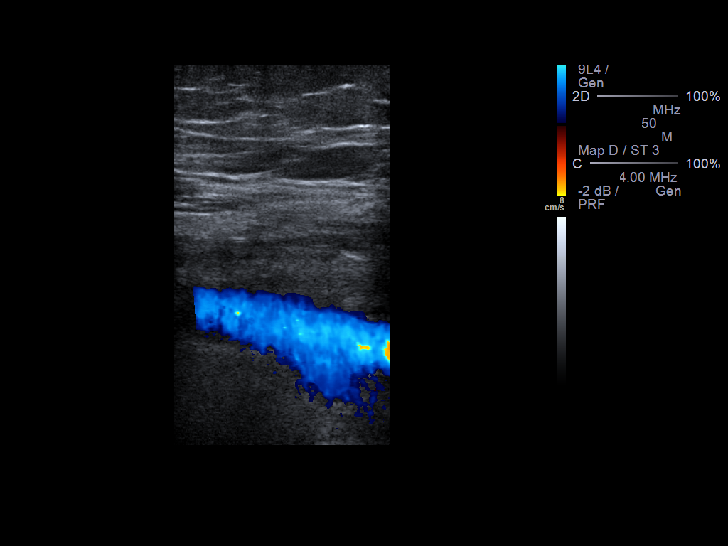
[im 12/44]
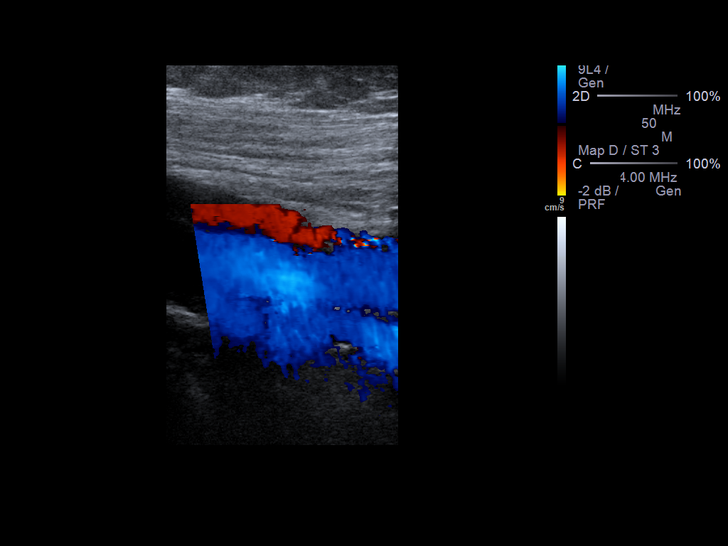
[im 14/44]
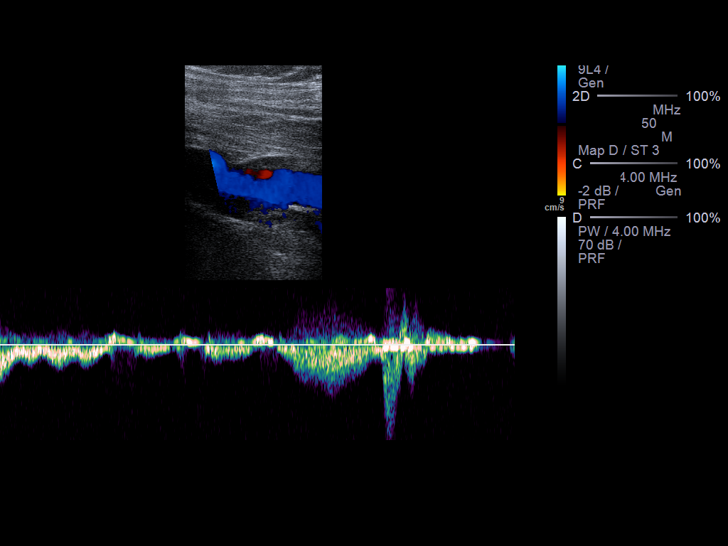
[im 17/44]
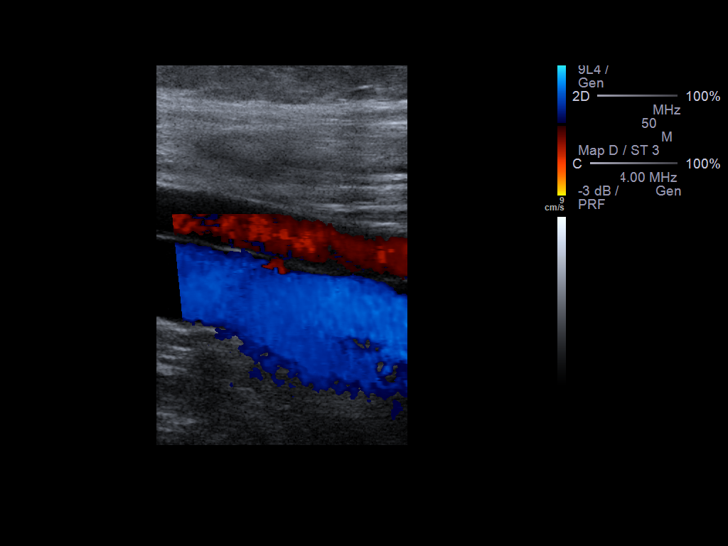
[im 21/44]
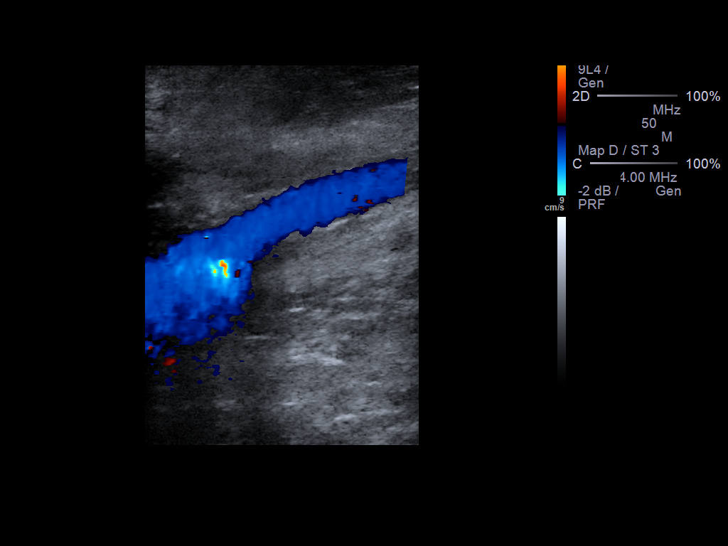
[im 23/44]
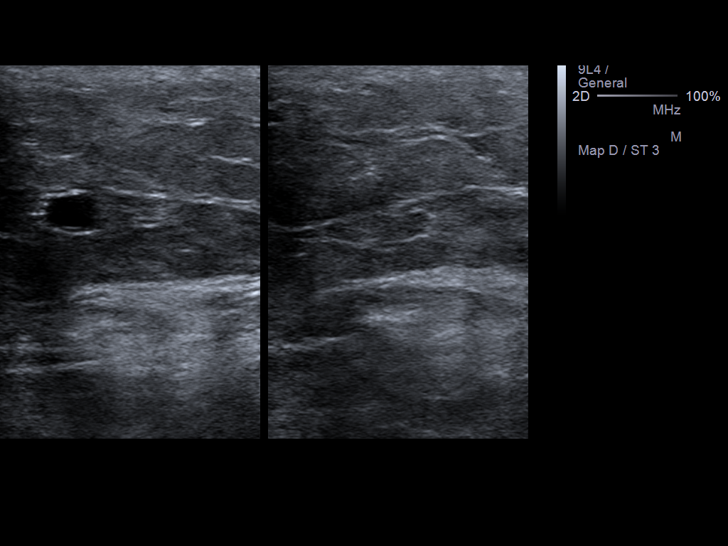
[im 27/44]
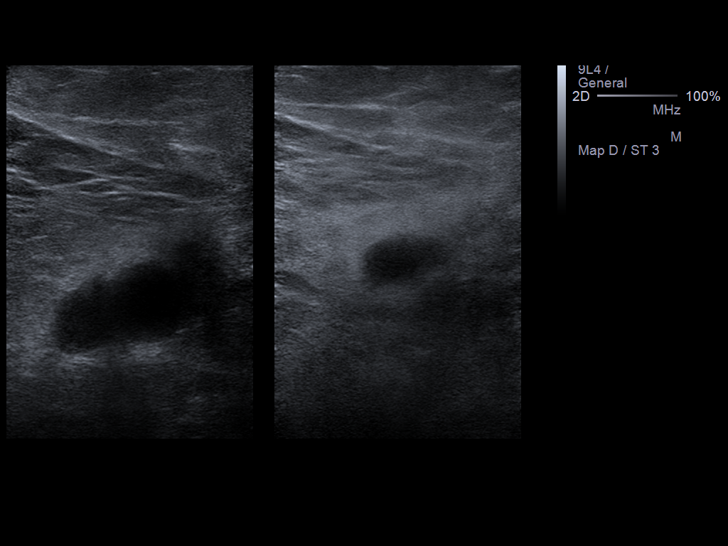
[im 30/44]
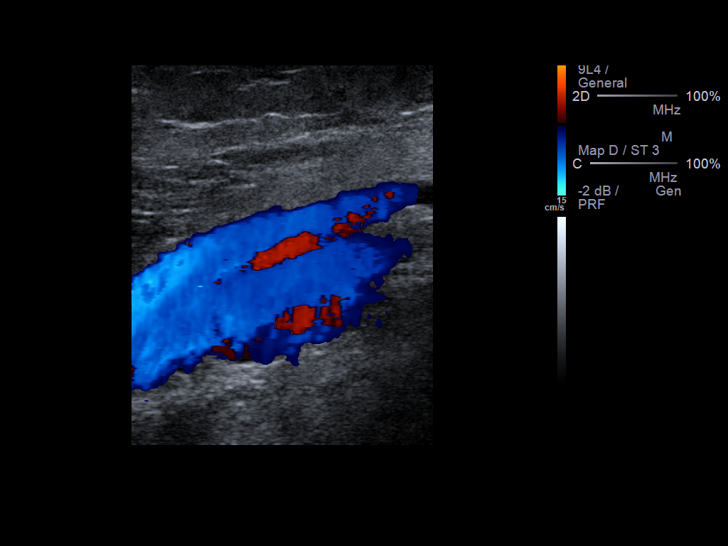
[im 34/44]
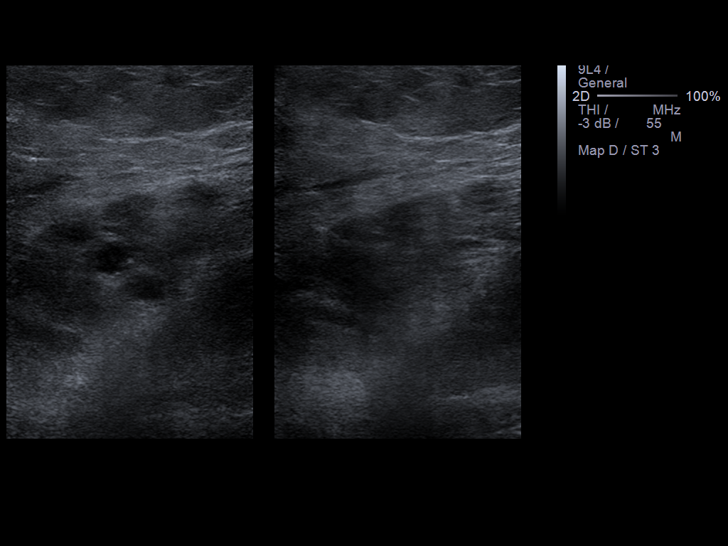
[im 36/44]
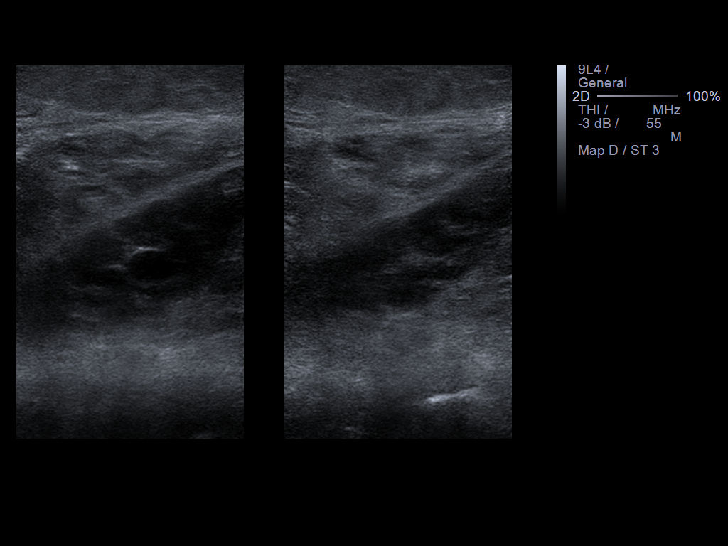
[im 40/44]
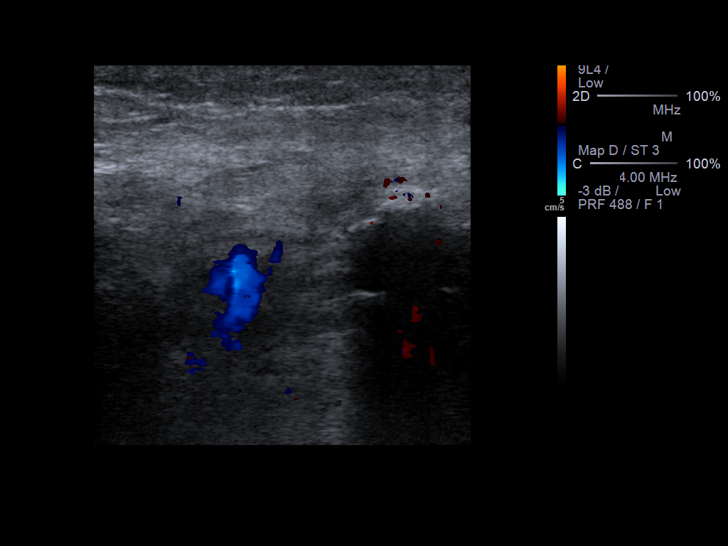
[im 44/44]
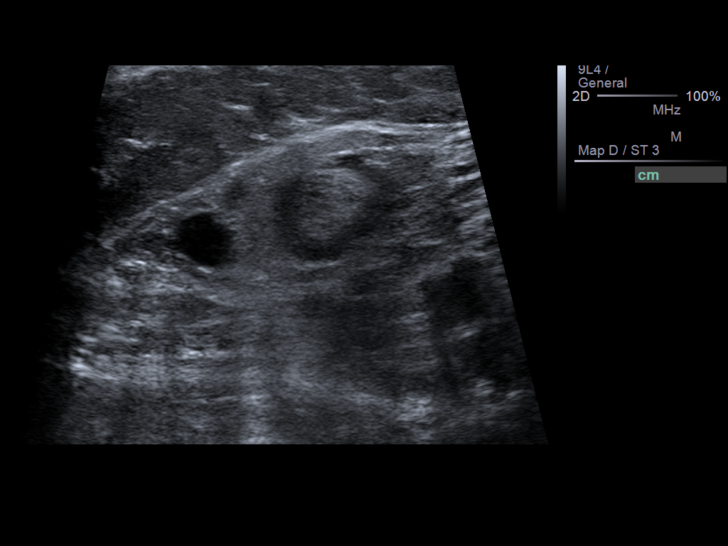

[14 of 24 positions shown; findings below may reference images not displayed]

FINDINGS: Thrombus within deep veins:  None visualized.

Compressibility of deep veins:  Normal.

Duplex waveform respiratory phasicity:  Normal.

Duplex waveform response to augmentation:  Normal.

Venous reflux:  None visualized.

Other findings: Prominent lymph node is noted in the left groin but
demonstrates a normal fatty hilus.
IMPRESSION: No evidence of deep venous thrombosis.

## 2014-10-31 ENCOUNTER — Ambulatory Visit
Admit: 2014-10-31 | Disposition: A | Discharge status: Home / Self Care | Payer: Self-pay | Attending: Oncology | Admitting: Oncology

## 2014-11-04 ENCOUNTER — Ambulatory Visit: Payer: Self-pay | Admitting: Oncology

## 2014-11-21 LAB — COMPREHENSIVE METABOLIC PANEL
ALBUMIN: 2.6 g/dL — AB
ALK PHOS: 64 U/L
Anion Gap: 5 — ABNORMAL LOW (ref 7–16)
BUN: 15 mg/dL
Bilirubin,Total: 0.5 mg/dL
CALCIUM: 8.2 mg/dL — AB
CO2: 35 mmol/L — AB
CREATININE: 0.64 mg/dL
Chloride: 93 mmol/L — ABNORMAL LOW
EGFR (Non-African Amer.): >60
GLUCOSE: 149 mg/dL — AB
Potassium: 3.6 mmol/L
SGOT(AST): 31 U/L
SGPT (ALT): 14 U/L
Sodium: 133 mmol/L — ABNORMAL LOW
Total Protein: 6.3 g/dL — ABNORMAL LOW

## 2014-11-21 LAB — CBC CANCER CENTER
BASOS ABS: 0 x10 3/mm (ref 0.0–0.1)
Basophil %: 0.2 %
EOS ABS: 0.2 x10 3/mm (ref 0.0–0.7)
Eosinophil %: 2.9 %
HCT: 33.8 % — ABNORMAL LOW (ref 35.0–47.0)
HGB: 11.3 g/dL — ABNORMAL LOW (ref 12.0–16.0)
Lymphocyte #: 0.5 x10 3/mm — ABNORMAL LOW (ref 1.0–3.6)
Lymphocyte %: 7.1 %
MCH: 30.1 pg (ref 26.0–34.0)
MCHC: 33.6 g/dL (ref 32.0–36.0)
MCV: 90 fL (ref 80–100)
MONO ABS: 0.5 x10 3/mm (ref 0.2–0.9)
Monocyte %: 8.2 %
Neutrophil #: 5.3 x10 3/mm (ref 1.4–6.5)
Neutrophil %: 81.6 %
PLATELETS: 271 x10 3/mm (ref 150–440)
RBC: 3.76 10*6/uL — ABNORMAL LOW (ref 3.80–5.20)
RDW: 16.2 % — AB (ref 11.5–14.5)
WBC: 6.5 x10 3/mm (ref 3.6–11.0)

## 2014-11-21 LAB — MAGNESIUM: Magnesium: 2 mg/dL

## 2014-11-28 ENCOUNTER — Ambulatory Visit
Admit: 2014-11-28 | Disposition: A | Discharge status: Home / Self Care | Payer: Self-pay | Attending: Oncology | Admitting: Oncology

## 2014-12-03 LAB — COMPREHENSIVE METABOLIC PANEL
AST: 28 U/L
Albumin: 2.7 g/dL — ABNORMAL LOW
Alkaline Phosphatase: 82 U/L
Anion Gap: 10 (ref 7–16)
BILIRUBIN TOTAL: 0.7 mg/dL
BUN: 18 mg/dL
Calcium, Total: 8.6 mg/dL — ABNORMAL LOW
Chloride: 92 mmol/L — ABNORMAL LOW
Co2: 33 mmol/L — ABNORMAL HIGH
Creatinine: 0.73 mg/dL
EGFR (Non-African Amer.): >60
Glucose: 188 mg/dL — ABNORMAL HIGH
POTASSIUM: 4.4 mmol/L
SGPT (ALT): 15 U/L
SODIUM: 135 mmol/L
TOTAL PROTEIN: 6.8 g/dL

## 2014-12-03 LAB — CBC CANCER CENTER
BASOS ABS: 0 x10 3/mm (ref 0.0–0.1)
Basophil %: 0.5 %
EOS ABS: 0.2 x10 3/mm (ref 0.0–0.7)
Eosinophil %: 2.5 %
HCT: 36.4 % (ref 35.0–47.0)
HGB: 12.2 g/dL (ref 12.0–16.0)
LYMPHS ABS: 0.8 x10 3/mm — AB (ref 1.0–3.6)
LYMPHS PCT: 10.2 %
MCH: 30 pg (ref 26.0–34.0)
MCHC: 33.6 g/dL (ref 32.0–36.0)
MCV: 89 fL (ref 80–100)
MONOS PCT: 6.6 %
Monocyte #: 0.5 x10 3/mm (ref 0.2–0.9)
Neutrophil #: 6.1 x10 3/mm (ref 1.4–6.5)
Neutrophil %: 80.2 %
PLATELETS: 327 x10 3/mm (ref 150–440)
RBC: 4.07 10*6/uL (ref 3.80–5.20)
RDW: 16.7 % — AB (ref 11.5–14.5)
WBC: 7.6 x10 3/mm (ref 3.6–11.0)

## 2014-12-03 LAB — MAGNESIUM: Magnesium: 2.1 mg/dL

## 2014-12-03 LAB — TSH: Thyroid Stimulating Horm: 4.474 u[IU]/mL

## 2014-12-03 LAB — T4, FREE: Free Thyroxine: 1.05 ng/dL

## 2014-12-12 ENCOUNTER — Other Ambulatory Visit: Payer: Self-pay | Admitting: Oncology

## 2014-12-12 DIAGNOSIS — C3490 Malignant neoplasm of unspecified part of unspecified bronchus or lung: Secondary | ICD-10-CM

## 2014-12-17 LAB — CBC
HCT: 36.8 % (ref 35.0–47.0)
HGB: 11.9 g/dL — ABNORMAL LOW (ref 12.0–16.0)
MCH: 29.4 pg (ref 26.0–34.0)
MCHC: 32.3 g/dL (ref 32.0–36.0)
MCV: 91 fL (ref 80–100)
Platelet: 267 10*3/uL (ref 150–440)
RBC: 4.05 10*6/uL (ref 3.80–5.20)
RDW: 17 % — ABNORMAL HIGH (ref 11.5–14.5)
WBC: 9 10*3/uL (ref 3.6–11.0)

## 2014-12-17 LAB — BASIC METABOLIC PANEL
Anion Gap: 8 (ref 7–16)
BUN: 19 mg/dL
CALCIUM: 8.2 mg/dL — AB
CHLORIDE: 97 mmol/L — AB
CO2: 32 mmol/L
Creatinine: 0.57 mg/dL
EGFR (African American): >60
EGFR (Non-African Amer.): >60
Glucose: 123 mg/dL — ABNORMAL HIGH
Potassium: 3.9 mmol/L
SODIUM: 137 mmol/L

## 2014-12-17 LAB — TROPONIN I: Troponin-I: 0.18 ng/mL — ABNORMAL HIGH

## 2014-12-18 LAB — TROPONIN I
Troponin-I: 0.15 ng/mL — ABNORMAL HIGH
Troponin-I: 0.14 ng/mL — ABNORMAL HIGH

## 2014-12-19 ENCOUNTER — Inpatient Hospital Stay
Admit: 2014-12-19 | Disposition: A | Discharge status: Skilled Nursing Facility | Payer: Self-pay | Attending: Internal Medicine | Admitting: Internal Medicine

## 2014-12-19 LAB — CBC WITH DIFFERENTIAL/PLATELET
Basophil #: 0 10*3/uL (ref 0.0–0.1)
Basophil %: 0.4 %
EOS PCT: 0 %
Eosinophil #: 0 10*3/uL (ref 0.0–0.7)
HCT: 32.1 % — AB (ref 35.0–47.0)
HGB: 10.4 g/dL — AB (ref 12.0–16.0)
LYMPHS ABS: 0.4 10*3/uL — AB (ref 1.0–3.6)
Lymphocyte %: 7.4 %
MCH: 29.2 pg (ref 26.0–34.0)
MCHC: 32.3 g/dL (ref 32.0–36.0)
MCV: 90 fL (ref 80–100)
MONO ABS: 0.1 x10 3/mm — AB (ref 0.2–0.9)
Monocyte %: 2.8 %
NEUTROS ABS: 4.3 10*3/uL (ref 1.4–6.5)
Neutrophil %: 89.4 %
Platelet: 254 10*3/uL (ref 150–440)
RBC: 3.56 10*6/uL — AB (ref 3.80–5.20)
RDW: 16.2 % — ABNORMAL HIGH (ref 11.5–14.5)
WBC: 4.8 10*3/uL (ref 3.6–11.0)

## 2014-12-19 LAB — MAGNESIUM: Magnesium: 2.4 mg/dL

## 2014-12-19 LAB — BASIC METABOLIC PANEL
Anion Gap: 7 (ref 7–16)
BUN: 35 mg/dL — ABNORMAL HIGH
CALCIUM: 8 mg/dL — AB
CREATININE: 0.7 mg/dL
Chloride: 101 mmol/L
Co2: 29 mmol/L
EGFR (African American): >60
EGFR (Non-African Amer.): >60
Glucose: 220 mg/dL — ABNORMAL HIGH
POTASSIUM: 4.5 mmol/L
Sodium: 137 mmol/L

## 2014-12-19 LAB — CLOSTRIDIUM DIFFICILE(ARMC)

## 2014-12-19 NOTE — Consult Note (Signed)
Brief Consult Note: Diagnosis: acute CVA.   Patient was seen by consultant.   Consult note dictated.   Recommend to proceed with surgery or procedure.   Orders entered.   Discussed with Attending MD.   Comments: acute stroke NIHSS= 11. facial droop right  aphasia and disartria mild right side weakness  no major contraindication for TPA  will proceed with 0.9 mg/kg  1/10 in 98mn the rest on 1 hour.  transfer to [facility] then to [facility].  Electronic Signatures: SJames Ivanoff RRoselie Awkward(MD)  (Signed 19-Sep-14 13:28)  Authored: Brief Consult Note   Last Updated: 19-Sep-14 13:28 by SCarolina Sink(MD)

## 2014-12-19 NOTE — Discharge Summary (Signed)
PATIENT NAME:  Melanie Wood, Melanie Wood MR#:  [id number] DATE OF BIRTH:  [birth year]  DATE OF ADMISSION:  02/04/2013 DATE OF DISCHARGE:  02/07/2013  DISCHARGE DIAGNOSES: 1.  Chronic obstructive pulmonary disease exacerbation.  2.  Congestive heart failure exacerbation.  3.  Positive troponins, likely supply/demand ischemia.  4.  Dementia.  5.  Heart failure.   HISTORY OF PRESENT ILLNESS:  Please see admission history and physical for details. Briefly, this is an 81 year old female with a history of mild CHF, as well as COPD, Alzheimer's disease, prior stroke and A. fib. She presented to the ED with the shortness of breath after upper respiratory infection. She was found to have hypoxia with sats down to 80% and she was admitted from to the Emergency Room.   HOSPITAL COURSE BY ISSUE:  1.  CHF. The patient's BNP was found to be 1400 on admission. She was gently diuresed and had improvement in her hypoxia. Her BUN increased from 11 to 25, showing effective volume repletion with diuretics.  2.  Positive troponins. The patient's troponin peaked at 0.22. She was seen by Cardiology who felt there was likely supply/demand ischemia from her COPD exacerbation. No further workup is recommended.  3.  COPD exacerbation:  The patient was started on nebulizers, as well as steroids and had marked improvement in her wheezing over the few days of admission. She was also treated with levofloxacin.  4.  Atrial fibrillation. She was continued on rate control and anticoagulation.   DISCHARGE MEDICATIONS: 1.  Aspirin 81 once a day.  2.  Prednisone taper 20 mg, 3 tablets a day for 3 days, then 2 tablets a day for 3 days, then 1 tablet a day for 3 days.  3.  Lisinopril 2.5 once a day.  4.  Xarelto 15 mg once a day.  5.  Tessalon Perles 100 mg every 8 hours as needed for cough.  6.  Carvedilol 6.25 twice a day.  7.  Advair 1 puff once a day.  8.  Albuterol ipratropium 1 inhaler every 6 hours as needed.  9.  Spiriva 18 mcg  once a day.  10.  Aricept 10 mg once a day.  11.  Lasix 20 mg once a day.  12.  Potassium chloride 20 mEq half a tablet once a day.  13.  Vitamin B complex.  14.  Levofloxacin 500 mg once a day to finish the course.   DISCHARGE:  The patient was discharged to assisted living. There she will receive some physical therapy to assess and treat.   DISCHARGE DIET:  No-salt added, regular consistency.   ACTIVITY:  As tolerated.   DISCHARGE FOLLOWUP:  The patient will follow up with Dr. Ola Spurr in 1 to 2 weeks.    ____________________________ Cheral Marker. Ola Spurr, MD dpf:nts D: 02/09/2013 22:05:25 ET T: 02/10/2013 18:29:12 ET JOB#: [id number]  cc: Cheral Marker. Ola Spurr, MD, <Dictator> DAVID Ola Spurr MD ELECTRONICALLY SIGNED 02/13/2013 17:27

## 2014-12-19 NOTE — H&P (Signed)
PATIENT NAME:  Melanie Wood, Melanie Wood MR#:  [id number] DATE OF BIRTH:  1930/07/24  DATE OF ADMISSION:  02/05/2013  PRIMARY CARE PHYSICIAN: Dr. Adrian Prows from [facility].   REFERRING PHYSICIAN: Dr. Owens Shark.   CHIEF COMPLAINT: Shortness of breath and cough.   HISTORY OF PRESENT ILLNESS: The patient is an 81 year old Caucasian female with a past medical history of chronic congestive heart failure, COPD, Alzheimer's disease, old history of stroke and Afib on Xarelto. She is presenting to the ER with a chief complaint of a 1-day history of shortness of breath. The patient was having some cold and upper respiratory tract infection for the past few days and today she started having shortness of breath associated with productive cough. She was coughing up yellowish phlegm but denies any chest pain or dizziness. Denies any fever or sick contacts. As the patient's shortness of breath is getting worse, she came into the ER and she was noticed to be hypoxic at 89%. She has received nebulizer treatments and Solu-Medrol 125 mg IV was given. As the patient's pulse ox was dropping down to 80% the patient is placed on 2 liters of oxygen. Chest x-ray did not reveal any acute infiltrates. Hospitalist team is called to admit the patient. Daughter is at bedside. Denies any other complaints.   PAST MEDICAL HISTORY: Chronic congestive heart failure, Alzheimer's disease, hard of hearing, COPD chronic, chronic history of stroke in the past, has ambulatory difficulties, chronic atrial fibrillation on Xarelto.   PAST SURGICAL HISTORY: Bilateral hip replacement, left knee replacement, lumbar back surgery, cervical fusion.   ALLERGIES: PENICILLIN, CODEINE AND SHELLFISH.   PSYCHOSOCIAL HISTORY: Lives at home, lives alone. She used to smoke but quit smoking several years ago. Denies alcohol or illicit drug usage.   FAMILY HISTORY: Mom deceased with stroke. Dad deceased with prostate cancer.   HOME MEDICATIONS: Aspirin 81 mg  once daily, Xarelto 50 mg 1 tablet once daily, Spiriva 18 mcg 1 inhalation once daily, lisinopril 2.5 mg 1/2 tablet once a day, Lasix 20 mg once daily, potassium 20 mEq 1/2 tablet once a day, Coreg 6.25 mg 1 tablet twice a day, Aricept 10 mg once daily, Advair Diskus 1 puff inhalation once daily.   REVIEW OF SYSTEMS:  CONSTITUTIONAL: Denies any fever, fatigue, weakness, pain. Denies any weight gain or loss.  EYES: Denies any blurry vision, glaucoma, cataracts.  ENT: Complaining of a cold and nasal stuffiness but denies any hearing loss, ear pain, epistaxis.  RESPIRATION: Complaining of productive cough. Chronic history of COPD. Positive wheezing and shortness of breath. Denies any painful respiration.  CARDIOVASCULAR: No chest pain, palpitations, syncope.  GASTROINTESTINAL: No nausea, vomiting, diarrhea or GERD.  GENITOURINARY: Denies any dysuria or hematuria.  GYNECOLOGIC AND BREASTS: Denies any breast mass or vaginal discharge.  ENDOCRINE: No polyuria, nocturia, thyroid problems.  HEMATOLOGIC AND LYMPHATIC: Denies any anemia, easy bruising or bleeding.  INTEGUMENTARY: No acne, rash, lesions.  MUSCULOSKELETAL: No joint pain in the neck, back, shoulder or gout. Denies any swelling.  NEUROLOGIC: Denies any vertigo or ataxia but has old history of stroke and ambulatory difficulties from old stroke.  PSYCHIATRIC: Denies any ADD, OCD, bipolar disorder. Denies depression.   PHYSICAL EXAMINATION:  VITAL SIGNS: Temperature 99.5, pulse 110, respirations 18, blood pressure 108/82, pulse ox 96% on 2 liters.  GENERAL APPEARANCE: Not in any acute distress. Moderately built and moderately nourished.  HEENT: Normocephalic, atraumatic. Pupils are equally reactive to light and accommodation. No scleral icterus. No conjunctival injection. No sinus tenderness.  Nares are congested. Positive postnasal drip. No pharyngeal exudate.  NECK: Supple. No JVD. No thyromegaly. No lymphadenopathy.  LUNGS: Minimal end  expiratory wheezing is present. No crackles. No rales or rhonchi. No anterior chest wall tenderness on palpation. No accessory muscle usage.  CARDIAC: S1, S2 normal. Regular rate and rhythm. No murmurs. No gallop.  GASTROINTESTINAL: Soft. Bowel sounds are positive in all 4 quadrants. Nontender, nondistended. No masses felt. No hepatosplenomegaly.  NEUROLOGIC: Awake, alert, oriented x3. Cranial nerves II through XII are intact. Motor and sensory are intact. Reflexes are 2+.  SKIN: Warm to touch. Normal turgor. No rashes. No lesions.  EXTREMITIES: Trace edema. No cyanosis and no clubbing.  PSYCHIATRIC: Normal mood and affect.   LABS AND IMAGING STUDIES: CHEST X-RAY: Cardiomegaly is present but no acute infiltrates are present. Troponin less than 0.02. WBC 12.0, hemoglobin 13.4, hematocrit 40.0, platelets 222. LFTs: Total protein 6.5, albumin 2.8. The rest of the LFTs are normal. Glucose 137, BUN 11, creatinine 0.37, sodium 139, potassium 3.8, chloride 107, CO2 23. GFR greater than 60. Anion gap 10. Serum osmolality 279. Calcium 8.0. A 12-LEAD EKG: Sinus tachycardia at 122 beats per minute. Atrial fibrillation with RVR.  PVCs are present. No acute ST-T wave changes are noticed.   ASSESSMENT AND PLAN: An 81 year old Caucasian female presenting to the ER with a chief complaint of shortness of breath and productive cough for 1 day. Will be admitted with the following assessment and plan:  1. Acute exacerbation of chronic obstructive pulmonary disease: Will provide her Solu-Medrol 60 mg intravenous q.6 hours. Nebulizer treatments will be continued. Will continue oxygen via nasal cannula to titrate pulse oximetry to 91%. Will give her intravenous levofloxacin.  2. Chronic history of atrial fibrillation: Rate controlled. The patient is on Xarelto. Will continue that.  3. Chronic history of congestive heart failure: Will continue her home medication Lasix, potassium supplement, beta blocker and ACE inhibitor.  Will continue aspirin, also.  4. Chronic history of stroke with ambulatory difficulties: Will continue baby aspirin.  5. Alzheimer's disease: Continue Aricept.  6. Will provide her gastrointestinal and deep vein thrombosis prophylaxis.   She is DNR. All of her 3 daughters are her POA. The diagnosis and plan of care was discussed in detail with the patient. She is aware of the plan. The patient will be transferred to Dr. Blane Ohara service in a.m.   TOTAL TIME SPENT ON ADMISSION: 50 minutes.   ____________________________ Nicholes Mango, MD ag:gb D: 02/04/2013 23:19:20 ET T: 02/05/2013 00:33:58 ET JOB#: [id number]  cc: Nicholes Mango, MD, <Dictator> Nicholes Mango MD ELECTRONICALLY SIGNED 02/06/2013 5:10

## 2014-12-19 NOTE — Consult Note (Signed)
Brief Consult Note: Diagnosis: Borderline elevated troponin, probable demadn supply without MI.   Patient was seen by consultant.   Consult note dictated.   Comments: REC  Agree with current therapy, defer full dose anticoagulation, cont rivaroxaban for stroke prevention, defer further cardiac diagnostics at htis time.  Electronic Signatures: Isaias Cowman (MD)  (Signed 10-Jun-14 12:48)  Authored: Brief Consult Note   Last Updated: 10-Jun-14 12:48 by Isaias Cowman (MD)

## 2014-12-19 NOTE — Consult Note (Signed)
Reason for Visit: This 81 year old Female patient presents to the clinic for initial evaluation of  presumed lung cancer .   Referred by Dr. Grayland Ormond.  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 Patient suffered CVA status post melanoma surgery. Now for concurrent chemoradiation for presumed stage IIIB lung cancer  Pathology Report pathology report reviewed   Imaging Report PET/CT scan, MRI scan and CT scans of head reviewed   Referral Report clinical notes reviewed   Planned Treatment Regimen concurrent chemoradiation for lung cancer   HPI   patient is an 81 year old female who presented with a melanoma of her left lower extremity. She underwent initial biopsy which was positive for melanoma. As part of her workup she had a PET/CT scanshowing abnormal hypermetabolic activity in the right paratracheal lymph nodes consistent with metastatic disease there was some abnormal density in the left lower lobe posteriorly but thought to be more representative of pneumonia.she went on to have a biopsy of her paratracheal nodes which was positive for squamous cell carcinoma. She also underwent wide local excision of the left lower extremity melanoma with split thickness skin graft. Sentinel nodes were negative tumor was a Clark's level 43.75 mm by breast low criteria melanoma with stage III T3 B. N0 M0. 6 other lymph nodes in the left groin were negative. Unfortunate patient's upper CVA after her melanoma surgery. She seen today for consideration of concurrent chemotherapy and radiation with a presumed diagnosis of occult lung cancer with mediastinal involvement. She is doing fairly well.  She does have apraxia anda aphasia. She has no cough hemoptysis or chest tightness.  Past Hx:    TIA:    COPD:    dementia:    atrial fibrillation:    cervical neck surgery:    back surgery:    left knee replacement:    bilateral hip replacement:   Past, Family and Social History:  Past Medical History positive   Cardiovascular atrial fibrillation   Respiratory COPD   Neurological/Psychiatric Alzheimer's; CVA; TIA   Past Surgical History bilateral hip replacement, left knee replacement, back surgery, cervical neck surgery   Family History noncontributory   Social History noncontributory   Additional Past Medical and Surgical History accompanied by daughter today   Allergies:   Penicillin: Rash  Codeine: GI Distress  Shellfish: N/V/Diarrhea  Home Meds:  Home Medications: Medication Instructions Status  nystatin topical 100000 units/g topical powder Apply topically to affected area 3 times a day Active  albuterol-ipratropium 2.5 mg-0.5 mg/3 mL inhalation solution 1 application inhaled 3 times a day, morning dose to be given before Advair. Active  Metanx Vitamin B Complex oral capsule 1 cap(s) orally once a day (at bedtime) Active  Spiriva 18 mcg inhalation capsule 1 each inhaled once a day Active  carvedilol 6.25 mg oral tablet 1 tab(s) orally 2 times a day Active  Xarelto 15 mg oral tablet 1 tab(s) orally once a day (in the evening) Active  lisinopril 2.5 mg oral tablet 0.5 tab(s) orally once a day Active  benzonatate 100 mg oral capsule 1 cap(s) orally every 8 hours, As needed, cough Active   Review of Systems:  General negative   Performance Status (ECOG) 0  Skin negative   Breast negative   Ophthalmologic negative   ENMT negative   Respiratory and Thorax negative   Cardiovascular see HPI   Gastrointestinal negative   Genitourinary negative   Musculoskeletal negative   Neurological see HPI   Psychiatric negative    Hematology/Lymphatics negative   Endocrine negative   Allergic/Immunologic negative   Review of Systems   review of systems notes obtained from nurse's notes  Nursing Notes:  Nursing Vital Signs and Chemo Nursing Nursing Notes: *CC Vital Signs Flowsheet:   28-Oct-14 13:47  Temp Temperature 98.3  Pulse Pulse 85  Respirations Respirations 21  SBP SBP 128  DBP DBP 75  Height (cm) centimeters 154    14:54  Temp Temperature 98.2  Pulse Pulse 88  Respirations Respirations 20  SBP SBP 122  DBP DBP 75  Pain Scale (0-10)  1  Pulse Oxi  96  Height (cm) centimeters 154   Physical Exam:  General/Skin/HEENT:  Skin normal   Eyes normal   ENMT normal   Head and Neck normal   Additional PE well-developed wheelchair-bound female in NAD. She has obvious aphasia and apraxia. No focal neurologic deficits are appreciated no motor or sensory level is appreciated. Lungs are clear to A&P cardiac examination shows irregular irregular heart rate. Crude visual fields are within normal range proprioception is intact. No cervical or subclavicular adenopathy is appreciated.   Breasts/Resp/CV/GI/GU:  Respiratory and Thorax normal   Cardiovascular normal   Gastrointestinal normal   Genitourinary normal   MS/Neuro/Psych/Lymph:  Musculoskeletal normal   Neurological normal   Lymphatics normal   Other Results:  Radiology Results: LabUnknown:    23-Jul-14 15:32, PET/CT Scan Melanoma Initial Staging  PACS Image     19-Sep-14 12:09, CT Head Without Contrast  PACS Image   CT:  CT Head Without Contrast   REASON FOR EXAM:    unable to form words or speak  COMMENTS:       PROCEDURE: CT  - CT HEAD WITHOUT CONTRAST  - May 17 2013 12:09PM     RESULT: CT of the brain demonstrates prominence of the ventricles and   sulci consistent with atrophy. There is prominent low-attenuation   diffusely in the periventricular and subcortical white matter most   consistent with chronic microvascular  ischemic changes. Basal ganglia   calcifications are seen. There is no evidence of intracranial hemorrhage,   mass, mass effect or evolving infarct. The sinuses and mastoid air cells   show normal appearing aeration. No depressed skull fracture is evident.    IMPRESSION:   1. Prominent atrophy with chronic microvascular ischemic disease. No     acute intracranial abnormality.    Dictation Site: 2        Verified By: Sundra Aland, M.D., MD  Nuclear Med:    23-Jul-14 15:32, PET/CT Scan Melanoma Initial Staging  PET/CT Scan Melanoma Initial Staging   REASON FOR EXAM:    Initial Staging Melanoma Primary Lesion Left Calf  COMMENTS:       PROCEDURE: PET - PET/CT MELANOMA INITIAL STAGE WB  - Mar 20 2013  3:32PM     RESULT: The patient is undergoing initial staging of melanoma involving   the left calf. The patient's fasting blood glucose level was 83 mg/dL.   The patient received 12.12 mCi of F-18 labeled FDG at 1:30 p.m. with   scanning beginning at 2:40 p.m. Delayed imaging was begun at 3:16 p.m. A   noncontrast CT scan was performed  at the same sitting for coregistration   and attenuation correction.    Uptake of the radiopharmaceutical within the neck is normal. Within the   chest there is increased uptake associated with a right paratracheal   lymph node. The maximal SUV is 10.5 with a mean of 6.2. In the left lower     lobe posteriorly there is increased uptake with maximal SUV 4.5 with a   mean of 2.4 corresponding to alveolar infiltrate. Within the abdomen and   pelvis there is normally expected urinary tract activity. There is a   small amount of bowel activity. No abnormal skeletal uptake is   demonstrated.     Delayed imaging over the lower extremities was performed. On the left   posteriorly in the upper calf there is a focus of mildly increased uptake   with maximal SUV of 1.3 with a mean of 0.7. This may be at the previous   surgical site and reflect  postsurgical change. There are small amounts of   increased uptake associated with both knees. This may be related to   metallic prosthetic components on the the left and to degenerative   changes on the right.    On the CT images the hypermetabolic lymph node in the right paratracheal     region is demonstrated on image 167. It measures approximately 1.5 cm in   diameter. There is patchy density in the left lower lobe posteriorly   consistent with pneumonia.    IMPRESSION:   1. There is an abnormal hypermetabolic right paratracheal lymph node   consistent with metastatic disease.  2. There is abnormal density in the left lower lobe posteriorly that   suggests pneumonia but malignant involvement can't be excluded.  3. A tiny focus of increased uptake over the posterior aspect of the   upper left calf may be at the site of previous biopsy.  4. There is increased uptake associated with the knees especially on the   left that is likely to degenerative and/or postsurgical.     Dictation Site: 1    Verified By: DAVID A. 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 Grayland Ormond. Would opt to treat this is a primary lung cancer with concurrent chemotherapy and radiation therapy. Would plan on delivering 6000 cGy using IMRT treatment planning and delivery to spare critical structures such as esophagus, spinal cord and normal lung volume based on her history of COPD. Risks and benefits of treatment and possible dysphasia, loss of some some normal lung volume, alteration of blood counts, fatigue or explained in detail to the patient and her daughter. They  both seem to comprehend my treatment plan well. Patient will be seeing Dr. Grayland Ormond today for his assessment regarding chemotherapy. I have set her up for CT simulation in about early next week.  I would like to take this opportunity to thank you for allowing me to continue to participate in this patient's care.  CC Referral:  cc: Dr. Adrian Prows   Electronic Signatures: Baruch Gouty, Roda Shutters (MD)  (Signed 28-Oct-14 15:10)  Authored: HPI, Diagnosis, Past Hx, PFSH, Allergies, Home Meds, ROS, Nursing  Notes, Physical Exam, Other Results, Relevent Results, Encounter Assessment and Plan, CC Referring Physician   Last Updated: 28-Oct-14 15:10 by Armstead Peaks (MD)

## 2014-12-19 NOTE — Consult Note (Signed)
PATIENT NAME:  Melanie Wood, Melanie Wood MR#:  [id number] DATE OF BIRTH:  1929/11/11  DATE OF CONSULTATION:  05/17/2013  CONSULTING PHYSICIAN:  [place] Sink, MD  PRIMARY CARE PHYSICIAN: Dr. Adrian Prows.   REFERRING PHYSICIAN: Dr. Molly Maduro.   CHIEF COMPLAINT: Acute stroke.   HISTORY OF PRESENT ILLNESS: This is a very nice 81 year old female who has history of hypertension, atrial fibrillation, asthma, and seasonal allergies and skin moles that were diagnosed as melanoma. On anticoagulation with Xarelto. The patient has allergies to CODEINE, PENICILLIN AND SHRIMP.  She came due to the left posterior leg melanoma who has been undergoing workup in oncology.  PET scan was positive with paratracheal lymph nodes, which actually returned as squamous cell cancer of unknown origin but the regional lesion was diagnosed as a melanoma.   The patient came in yesterday for surgery, 05/16/2013 and Dr. Rexene Edison removed a left leg melanoma with sentinel lymph node biopsy of the left groin. Local excision, lower extremity melanoma and ellipse  measuring 7 x 14 cm, a split thickness skin graft of the left thigh to right lower extremity wide local excision with estimated blood loss of 20 mL. The patient had skin graft and a wound VAC.   I was asked to see the patient stat by Dr. Leanora Cover because of changes in her neurologic exam.    Apparently most of the symptoms started around 11:45. She had a CT scan of the head that did not show any bleeding. I do not have the official report, but we looked it up with Dr. Leanora Cover. Nothing indicates bleeding. I paged Radiology to see if they could read a stat as the report was done from the floor, not a stat.    The patient has developed severe and acute aphasia, not able to communicate with droop of the right side of the corner of her mouth. Her full face is drooped on that side and the patient again cannot communicate. Whenever she tries to [facility] words, those  words are dysarthric. The patient has some weakness actually, at the level of the right side which could be chronic and is not severe. Most of her symptoms are mostly related to the face and speech.   The patient has normal blood pressure 130s/80's. Her heart rate is normal. There is no active bleeding at this moment. There are no major contraindications for TPA as per list of contraindications on TPA orders. There is an NIHSS scale equal to11, for which Dr. Jennings Books was contacted to hear his opinion for TPA as the patient is still on the window. He is this patient's neurologist. He sees her for beginnings of dementia, which has been very well controlled.  He recommended to proceed with TPA. At this moment, we ordered 0.9 mg/kg one-tenth of the dose given within 5 min the rest within an hour.  Dr. Manuella Ghazi also recommended to transfer the patient acutely to [facility] after this is done for monitoring on a neuro unit.   The patient at this moment is stable. I discussed all the case with the family. She is not able to give me any more information, other than what the family can tell me as far as her condition. She has atrial fibrillation and she has been on Xarelto in the past. The patient had to stop her Xarelto in order to do this procedure and it has been about 4 days. PT-INR has been ordered stat but we ordered the TPA stat, so it could get mixed.  REVIEW OF SYSTEMS: Unable to obtain due to the patient's aphasia.   PAST MEDICAL HISTORY: 1.  Chronic congestive heart failure.  2.  Alzheimer disease.  3.  Hearing loss.  4.  COPD.  5.  Atrial fibrillation.  6.  It has been documented that the patient had a stroke in the past, although family is not aware of it.  PAST SURGICAL HISTORY: Bilateral hip replacement, knee replacement, lumbar back surgery, cervical fusion. Excision of melanoma yesterday.   ALLERGIES: PENICILLIN, CODEINE AND SHELLFISH.    SOCIAL HISTORY: The patient lives at home by herself.  She used to smoke, but quit several years ago. She does not drink any alcohol. She does not use any drugs. She is very independent. She has beginnings of mild dementia. She actually is able to take care of her home.   FAMILY HISTORY: Stroke in her mother. Prostate cancer in her father.   CURRENT MEDICATIONS: Advair Diskus 100/50, Aricept 10 mg, aspirin 81 mg, Coreg 6.25 mg daily, Lasix 40 mg daily, lisinopril with hydrochlorothiazide 20/12.5 mg daily, Metanx once daily, Spiriva inhaler once daily, Klor-Con 20 mEq once daily, Xarelto 15 mg once daily again and this has been stopped for over 3 days, donepezil 10 mg at bedtime.   PHYSICAL EXAMINATION: VITAL SIGNS: Her blood pressure is 137/84, her pulse is 76, respirations 18, temperature 97.8. Oxygen saturation 92% on 2 liters of oxygen.   GENERAL: The patient is comfortable on bed. She looks a little bit anxious. Hemodynamically stable.  HEENT: Her pupils are equal, round and reactive. Her extraocular movements are intact. Her mucosa is moist. Anicteric sclerae. Pink conjunctivae. No oral lesions.  NEUROLOGIC: The patient has cranial nerves intact except for facial droop and aphasia. The patient's hearing is decreased which is chronically decreased, due to neurosensorial loss. Her extraocular movements were intact as mentioned above. There are no significant gaze abnormalities. Her mouth is drooped at the level of the right commissure. The patient is not able to a smile. She is not able to blow air on her cheeks because of leak of it. The patient is not able to communicate. She mumbles words which are intelligible and those words sound dysarthric. The patient is able to raise her arms and there is right side drift after about 30 seconds, which is minimal but different than the left side. The same with the lower extremities. The patient does not seem to have significant deficits on the left side of her body. There is no neglect. Sensation in her 4 limbs,  apparently is normal. Cerebellar test is overall normal, with rapid alternating movements which are a little bit slow but not significantly. Finger-to-nose appears be normal.  NECK: Supple. No JVD. No thyromegaly. No adenopathy. No carotid bruits.  CARDIOVASCULAR: Regular rate and rhythm. No murmurs, rubs or gallops.  LUNGS: Clear without any wheezing or crepitus. No use of accessory muscles.  ABDOMEN: Soft, nontender, nondistended. No hepatosplenomegaly. No masses. Bowel sounds are positive.  EXTREMITIES: No edema, cyanosis or clubbing. Pulses +2. Capillary refill less than 3. PSYCHIATRIC: Unable to assess due to aphasia.  SKIN: No rashes, petechiae. There is no significant bleeding through incisions on the groin and upper leg from excision of melanoma. Pulses +2. Capillary refill less than 3.  LYMPHATIC: Negative for lymphadenopathy.  DIAGNOSTIC RESULTS: Glucose is 106. Creatinine is 0.57, sodium 140, potassium 4.7. Calcium is 8.2. Pending PT-INR which have been ordered stat. White count is 7.8, hemoglobin 12.5, hematocrit 37, platelet count 205.  CT of the head without contrast, stat read: Prominent atrophy with chronic microvascular ischemic disease. No acute intracranial abnormality.   Her EKG showed atrial fibrillation, but no RPR.   ASSESSMENT AND PLAN: An 81 year old female with history of atrial fibrillation who stopped her Xarelto and her aspirin for surgery. She had the surgery yesterday and today she had an acute stroke.   1.  Acute stroke. The patient is within the window for TPA. There is no significant contraindication for the TPA at this moment. Her NIHSS scale is 11.  Her blood pressure is in the 130s over 80s and she is hemodynamically stable. There is no significant acute bleeding at this moment. The patient has never had a head bleed or gastrointestinal bleed. The patient is a good candidate for TPA. The case was commented with Dr. Jennings Books of neurology who agrees with the  assessment. The patient is going to be transferred to the Critical Care Unit. She is going to have TPA. After the TPA, we are going to arrange transfer to [facility] to a neurologic unit. 2.  Other medical problems are stable.   This is a critical-care time of 1 hour.      ____________________________ Cool Sink, MD rsg:dp D: 05/17/2013 13:44:00 ET T: 05/17/2013 14:14:09 ET JOB#: [id number]  cc: [place] Sink, MD, <Dictator> Ledora Delker America Brown MD ELECTRONICALLY SIGNED 05/23/2013 10:55

## 2014-12-19 NOTE — Op Note (Signed)
PATIENT NAME:  MIMIE, GOERING MR#:  [id number] DATE OF BIRTH:  Sep 27, 1929  DATE OF PROCEDURE:  05/16/2013  SURGEON:  Marlyce Huge, MD   PREOPERATIVE DIAGNOSIS: Left leg melanoma.   POSTOPERATIVE DIAGNOSIS:  Left leg melanoma.    PROCEDURE PERFORMED:  1.  Sentinel lymph node biopsy of left groin.  2.  Wide local excision of lower extremity melanoma and ellipse measuring 7 x 14 cm.  3.  Split-thickness skin graft from the left thigh to right lower extremity wide local excision.   ESTIMATED BLOOD LOSS: 20 mL.   COMPLICATIONS: None.   SPECIMEN:  1.  Left groin sentinel nodes x 2. Other palpable lymph node cluster.   2.  Wide local excision of melanoma specimen.  ANESTHESIA: General.   COMPLICATIONS: None.   INDICATION FOR SURGERY: Ms. Bedore is a pleasant 81 year old female who was diagnosed with a melanoma on shave biopsy that was noted to be at least, from what I recall, 3 mm deep. However, was not taken to excision. She was thus brought to the operating room for management of melanoma and sentinel lymph node biopsy of groin.   DETAILS OF PROCEDURE: Informed consent was obtained. Ms. Nipper was brought to the operating room suite. She was laid supine on the operating room table. She had previously undergone injection of her melanoma and post injection scintography showed at least 2 or 3 lymph nodes in the left groin. I then used 1:1 diluted methylene blue and injected 6 mL into the tissue around the melanoma and rubbed the dye in. I then proceeded to prep the left leg and groin. We then proceeded to make an incision at areas of high counts in her groin.  This was above the groin crease.  It was made transversely. I then dissected deeply through the groin crease to my first lymph node which measured approximately 300 on the portable counter.  This node appeared to be blue. All lymphatics were ligated. It was then sent to pathology. I then proceeded to look for another node.  I  found another which measured about 60 and that was removed as well. I then found a small cluster of nodes which appeared to be of normal size but slightly harder and those were sent to pathology too.  I then irrigated the wound. I inserted a JP, because due to the dissection, the patient was at a high risk for seroma.  This was brought out through an incision lateral to the main incision and secured with a 3-0 nylon suture.  I then closed the wound in 3 layers. The deep layer of running 3-0 Vicryl, an interrupted layer of more superficial deep dermal via 3-0 Vicryl and 4-0 Monocryl subcuticular. A dressing was then placed over the wound. I then made my attention to the leg.  The leg was brought so that I could develop adequate exposure. It measured 2 cm to each side to provide an adequate surgical margin and measured 6 cm cephalad and caudad to create a nice ellipse which would heal well.  I then made this incision down to the fascia of the gastrocnemius. The entire flap was then taken off. There were 2 defects in the fascia of the gastrocnemius which I closed with a running 3-0 Vicryl. The specimen was then labeled in its caudate and lateral aspects.  I then proceeded to skin graft. I placed the leg down and then applied mineral oil to her lateral thigh and then placed a dermatome and removed a  2 cm wide strip of skin approximately 8 cm long. The thickness was 0.12 inches which I found to be a bit thinner than I had wanted and I proceeded to have a second strip of 0.15, which appeared to be more adequate. I then placed epinephrine soaked gauze pads over the donor site. I then proceeded to lift the leg again and sutured the 2 pieces of meshed skin to the previous excision site using interrupted 3-0 chromic. I then placed a piece of Adaptic over the graft and a wound VAC over and that was attached.  I then proceeded to dress the donor site with Xeroform gauze and a Tegaderm. The left groin incision was then addressed  as well. The drapes were then taken down. The patient was then awoken, extubated and brought to the postanesthesia care unit. There were no immediate complications. Needle, sponge, and instrument counts correct at the end of the procedure.    ____________________________ Glena Norfolk. Arav Bannister, MD cal:dp D: 05/17/2013 08:49:24 ET T: 05/17/2013 09:31:43 ET JOB#: [id number]  cc: Harrell Gave A. Sharnee Douglass, MD, <Dictator> Floyde Parkins MD ELECTRONICALLY SIGNED 05/18/2013 19:19

## 2014-12-19 NOTE — Consult Note (Signed)
PATIENT NAME:  Melanie Wood, Melanie Wood MR#:  [id number] DATE OF BIRTH:  1929/12/23  DATE OF CONSULTATION:  02/05/2013  PRIMARY CARE PHYSICIAN:  Dr. Ola Spurr.  CONSULTING PHYSICIAN:  Isaias Cowman, MD  CHIEF COMPLAINT: Shortness of breath.   REASON FOR CONSULTATION: Consultation requested for evaluation of elevated troponin.   HISTORY OF PRESENT ILLNESS: The patient is an 81 year old female with history of chronic diastolic congestive heart failure, chronic obstructive pulmonary disease and atrial fibrillation. The patient presented to [facility] Emergency Room on 02/04/2013 with a one day history of sore throat, followed by productive cough and shortness of breath with wheezing. She presented to [facility] Emergency Room where she clinically appeared to be experiencing a chronic obstructive pulmonary disease flare, treated with nebulizer therapy and intravenous steroids with clinical improvement. Chest x-ray did not reveal any acute infiltrates. The patient was admitted where she has received intravenous antibiotics. Follow up troponin was borderline elevated at 0.19, in the absence of chest pain or ECG changes.   PAST MEDICAL HISTORY: 1. Chronic atrial fibrillation.  2. Chronic diastolic congestive heart failure.  3. Chronic obstructive pulmonary disease.  4. Alzheimer's dementia.   MEDICATIONS ON ADMISSION: Aspirin 81 mg daily. Xarelto 15 mg daily. Spiriva 18 mcg inhalation daily, lisinopril 1.25 mg daily. Furosemide 20 mg daily. Potassium 10 mEq daily. Carvedilol 6.5 mg b.i.d.  Aricept 10 mg daily. Advair Diskus 1 puff daily.   SOCIAL HISTORY: The patient currently is a resident of [place]. She has a 40 pack-year tobacco use history. She is married; her husband currently is in a memory care unit.   FAMILY HISTORY: No immediate family history of coronary artery disease or myocardial infarction.   REVIEW OF SYSTEMS:   CONSTITUTIONAL: No fever or chills.   EYES: No blurry vision.   EARS: No  hearing loss.   RESPIRATORY: The patient had shortness of breath, productive cough and wheezing.   CARDIOVASCULAR: The patient denies chest pain.   GASTROINTESTINAL: No nausea, vomiting or diarrhea.   GENITOURINARY: No dysuria or hematuria.   ENDOCRINE: No polyuria or polydipsia.   HEMATOLOGICAL: No easy bruising or bleeding.   INTEGUMENTARY: No rash.   MUSCULOSKELETAL: No arthralgias or myalgias.   NEUROLOGICAL: No focal muscle weakness or numbness.   PSYCHOLOGICAL: No depression or anxiety.   PHYSICAL EXAMINATION: VITAL SIGNS: Blood pressure 115/75, pulse 80, respirations 20, temperature 95%.   HEENT: Pupils equal, reactive to light and accommodation.   NECK: Supple without thyromegaly.   LUNGS: Clear.   HEART: Normal JVP. Normal PMI. Regular rate and rhythm. Normal S1, S2. No appreciable gallop, murmur or rub.   ABDOMEN: Soft and nontender. Pulses were intact bilaterally.   MUSCULOSKELETAL: Normal muscle tone.   NEUROLOGIC: The patient is alert and oriented x 3. Motor and sensory both grossly intact.   IMPRESSION: An 81 year old female who presents with one day history of productive cough, wheezing and shortness of breath consistent with chronic obstructive pulmonary disease exacerbation and with possible element of diastolic congestive heart failure. The patient has borderline elevated troponin on second and third set of enzymes, in the absence of chest pain or ECG changes. This likely represents demand/supply ischemia without myocardial infarction.   RECOMMENDATIONS: 1. Agree with overall current therapy.  2. Would defer full dose anticoagulation.  3. Continue Xarelto for stroke prevention.  4. Continue diuresis.  5. Would defer further noninvasive or invasive cardiac evaluation at this time.   ____________________________ Isaias Cowman, MD ap:rw D: 02/05/2013 12:46:39 ET T: 02/05/2013 14:31:22 ET  JOB#: [id number]  cc: Isaias Cowman, MD,  <Dictator> Isaias Cowman MD ELECTRONICALLY SIGNED 02/11/2013 12:46

## 2014-12-19 NOTE — Discharge Summary (Signed)
PATIENT NAME:  Melanie Wood, Melanie Wood MR#:  [id number] DATE OF BIRTH:  03-19-1930  DATE OF ADMISSION:  05/16/2013 DATE OF DISCHARGE:  05/17/2013  HISTORY OF PRESENT ILLNESS: Please refer to medicine consultation dictated just within the last hour. The patient is admitted for the treatment of melanoma with removal of a melanoma located on the left lower extremity. The patient underwent a sentinel lymph node biopsy of the left groin, a wide excision of lower extremity melanoma, an ellipse measuring 7 x 14 cm, split-thickness skin graft was done. Wound VAC was applied. I was called to evaluate the patient due to acute changes in her neurologic examination. The patient became aphasic. The aphasia was significant with right droop of the face. The patient was fully evaluated. There were no significant contraindications for TPA and the patient was within the window for treatment. The case was discussed with Dr. Jennings Books, who is a neurologist here in [place], and he agreed with the assessment and the plan, which was to begin TPA. The patient underwent TPA 0.9 mg/kg given to the patient IV. The patient was transferred to the CCU and at this moment there has not been any significant improvement of the symptoms. I discussed the case with Dr. Tressie Ellis, neurology on charge of the stroke team, who decided to accept the patient. Please refer to my medicine consult for more details.   I spent about 90 minutes so far with this patient today, on discharge summary and critical care time.  ____________________________ [place] Sink, MD rsg:sb D: 05/17/2013 15:08:21 ET T: 05/17/2013 15:20:03 ET JOB#: [id number]  cc: [place] Sink, MD, <Dictator> Birtie Fellman America Brown MD ELECTRONICALLY SIGNED 05/23/2013 10:55

## 2014-12-20 LAB — BASIC METABOLIC PANEL
Anion Gap: 8 (ref 7–16)
BUN: 30 mg/dL — ABNORMAL HIGH
CALCIUM: 8.2 mg/dL — AB
CHLORIDE: 100 mmol/L — AB
CREATININE: 0.53 mg/dL
Co2: 27 mmol/L
EGFR (African American): >60
GLUCOSE: 225 mg/dL — AB
Potassium: 4.5 mmol/L
Sodium: 135 mmol/L

## 2014-12-20 NOTE — H&P (Signed)
PATIENT NAME:  Melanie Wood, Melanie Wood MR#:  [id number] DATE OF BIRTH:  [birth year]  DATE OF ADMISSION:  03/23/2014  PRIMARY CARE PHYSICIAN: Dr. Ola Spurr  REFERRING PHYSICIAN: Dr. Joni Fears  CHIEF COMPLAINT: Slurred speech.  HISTORY OF PRESENT ILLNESS: The patient is an 81 year old female with a history of lung cancer, squamous-cell carcinoma, congestive heart failure, chronic obstructive pulmonary disease, comes to the Emergency Department with complaints of slurred speech and difficulty speaking words. This started around midnight. The patient's daughters also noticed that the patient was slurred speech. Concerning this, EMS was called, and she was brought to the Emergency Department. Workup in the Emergency Department with CT head without contrast did show any acute intracranial abnormality. Showed encephalomalacia in the spine, posterior left frontal region suggestive of infarct. The patient lives in an assisted-living facility. The patient also has been having cough with productive sputum. Has been having shortness of breath since January 2015. Prior to coming to the Emergency Department, the patient noticed having increased redness in both lower extremities. However, after coming to the Emergency Department, the patient was asked to walk, which caused her to have significant redness of the legs, which the patient attributes to having an allergic reaction to the socks the patient wore in the Emergency Department. The patient was given Benadryl. The patient states she has some pain in both legs.  PAST MEDICAL HISTORY:  1.  History of lung carcinoma. 2.  Squamous-cell carcinoma. 3.  Congestive heart failure. 4.  Alzheimer dementia. 5.  Chronic obstructive pulmonary disease, oxygen dependent. 6.  Atrial fibrillation on Xarelto.  7.  History of stroke.  PAST SURGICAL HISTORY:  1.  Cervical neck surgery. 2.  Back surgery. 3.  Left knee replacement. 4.  Bilateral hip replacements.  ALLERGIES:  1.   CODEINE. 2.  SHELLFISH. 3.  LEVOFLOXACIN. 4.  PENICILLIN.   HOME MEDICATIONS:  1.  Zofran 4 mg 1 tablet every 8 hours. 2.  Xarelto 15 mg once a day. 3.  Tramadol 50 mg 2 times a day. 4.  Spiriva 18 mcg once a day. 5.  Sertraline 50 mg once a day. 6.  Potassium chloride once a day. 7.  Omeprazole 20 mg 2 times a day. 8.  Toprol-XL 25 mg once a day. 9.  Metolazone 2.5 mg 2 times a day. 10.  Lasix 40 mg once a day. 11.  Ferrous sulfate 325 mg 2 times a day. 12.  Dulera 5 mcg 2 puffs 2 times a day. 13.  Donepezil 5 mg once a day. 14.  Atorvastatin 40 mg once a day. 15.  Albuterol every 6 hours. 16.  Norco 5/325 mg every 8 hours.   SOCIAL HISTORY: Remote history of smoking. Denies alcohol or drug use. Lives in an assisted-living facility.  FAMILY HISTORY: Positive for CVAs.  REVIEW OF SYSTEMS: CONSTITUTIONAL: Experienced generalized weakness.  EYES: No change in vision. ENT: No change in hearing. RESPIRATORY: Has cough, shortness of breath. CARDIOVASCULAR: No chest pain, palpitations. GASTROINTESTINAL: No nausea, vomiting, abdominal pain. GENITOURINARY: No dysuria or hematuria. HEMATOLOGIC: No easy bruising or bleeding. SKIN: Has redness in both lower extremities. NEUROLOGIC: Has slurred speech.  PHYSICAL EXAMINATION: GENERAL: This is a well-built, well-nourished, age-appropriate female, lying down in the bed, not in distress. VITAL SIGNS: Temperature 98.2, pulse 97, blood pressure 101/80, respiratory rate of 14, oxygen saturation 97% on room air. HEENT: Head normocephalic, atraumatic. There is no scleral icterus. Conjunctivae normal. Pupils equal and reactive. Extraocular movements are intact. Mucous membranes moist. No pharyngeal erythema.  NECK: Supple. No lymphadenopathy, no JVD, no carotid bruit. CHEST: There is no focal tenderness. LUNGS: Bilateral decreased, wheezes. HEART: S1 and S2. Regular. No murmurs are heard. ABDOMEN: Bowel sounds present. Soft, nontender,  nondistended. EXTREMITIES: Has redness in both lower extremities, extending up to the knees. NEUROLOGIC: The patient is alert, oriented to place, person, and time. Cranial nerves II through XII are intact. Motor 5/5 in the upper and lower extremities.  LABORATORY DATA: CMP is completely within normal limits. CBC is completely within normal limits. CT of the head without contrast previous history of stroke.  ASSESSMENT AND PLAN: The patient is an 81 year old female with known history of stroke, who comes to the emergency department with difficulty finding words and slurred speech. The patient states she has some residual symptoms.  1.  Transient ischemic attack/cerebrovascular accident. We will obtain MRI of the brain, keep the patient on aspirin. The patient is already on Xarelto.  2.  Chronic obstructive pulmonary disease exacerbation. Continue the breathing treatments and Solu-Medrol. 3.  Pneumonia community acquired. Keep the patient on Rocephin and Zithromax. 4.  Bilateral lower extremity redness. This seems to be more of a cellulitis. The Rocephin should help with this. However, we will continue to closely follow up. 5.  Hypertension, currently well controlled. Continue with home medications. 6.  Keep the patient on deep venous thrombosis prophylaxis. The patient is already on Xarelto.  TIME SPENT: 50 minutes.     ____________________________ Monica Becton, MD pv:cg D: 03/23/2014 04:49:51 ET T: 03/23/2014 05:59:26 ET JOB#: [id number]  cc: Monica Becton, MD, <Dictator> Monica Becton MD ELECTRONICALLY SIGNED 04/03/2014 21:06

## 2014-12-20 NOTE — Discharge Summary (Signed)
PATIENT NAME:  Melanie Wood, BOEHNING MR#:  [id number] DATE OF BIRTH:  21-Nov-1929  DATE OF ADMISSION:  03/23/2014 DATE OF DISCHARGE:  03/24/2014  CONSULTING NEUROLOGIST: Mila Homer. Tamala Julian, MD  DISCHARGE DIAGNOSES:  1.  Possible seizures.  2.  Prior cerebrovascular accident.  3.  Hypertension.  4.  Atrial fibrillation.  5.  Chronic obstructive pulmonary disease exacerbation.  6.  Lower extremity edema and mild cellulitis.   HISTORY OF PRESENT ILLNESS: Please see initial history and physical for details. 1.  The patient was admitted after an episode witnessed by her family where her eyes rolled back and she lost consciousness briefly. She had some abnormal movements, but not quite seizure-like activity. She was admitted, had a negative CT and MRI of the brain. She was seen by neurology, who felt it could be initial first episode of seizure. EEG, however, was negative.  Plan is to observe off of medications.  2.  The patient also had some wheezing on exam and was started on Solu-Medrol as well as ceftriaxone and azithromycin. She will be discharged on a prednisone taper with a flutter valve, as well as azithromycin and her nebulizers.  3.  Lower extremity edema. She was diuresed gently with IV Lasix and will resume her outpatient Lasix and twice-a-week Zaroxolyn.   DISCHARGE MEDICATIONS: Please see [facility] physician discharge instructions for details. New medications would include azithromycin for 3 days as well as a prednisone taper.   DISCHARGE FOLLOWUP: The patient will follow up with Dr. Ola Spurr in 1-2 weeks and with neurology, Dr. Manuella Ghazi, in 1-2 weeks.  DISCHARGE OXYGEN: 2 L as at home by nasal cannula.  DISCHARGE CODE STATUS: DNR.  TIME TAKEN FOR DISCHARGE: This discharge took 35 minutes.   ____________________________ Cheral Marker. Ola Spurr, MD dpf:sk D: 03/24/2014 14:20:25 ET T: 03/25/2014 02:17:20 ET JOB#: [id number]  cc: Cheral Marker. Ola Spurr, MD, <Dictator> Ardelia Wrede Ola Spurr  MD ELECTRONICALLY SIGNED 03/25/2014 15:18

## 2014-12-20 NOTE — Consult Note (Signed)
Referring Physician:  Angelena Form :   Primary Care Physician:  Angelena Form : [facility], Internal Medicine, [address] [place], [place] [address], [phone]  Reason for Consult: Admit Date: 23-Mar-2014  Chief Complaint: seizure  Reason for Consult: new onset seizure   History of Present Illness: History of Present Illness:   81 yo RHD F presents to [facility] secondary to possible witnessed seizure.  Pt was sitting at home when she made some mumbling noises, had glazed look in her eyes and then became unresponsive with some shaking in her arms.  There was not tonic phase described.  Unclear if there was incontinence because pt had diaper on.  No tongue biting.  Pt was confused after.  No previous episodes.  Pt does not remember episode but is limited by dementia.  She is usually at home by herself per family.  ROS:  General denies complaints   HEENT no complaints   Lungs cough   Cardiac no complaints   GI no complaints   GU no complaints   Musculoskeletal no complaints   Extremities no complaints   Skin no complaints   Neuro seizure   Endocrine no complaints   Past Medical/Surgical Hx:  Congestive Heart Failure:   TIA:   COPD:   dementia:   atrial fibrillation:   cervical neck surgery:   back surgery:   left knee replacement:   bilateral hip replacement:   Past Medical/ Surgical Hx:  Past Medical History reviewed by me as above   Past Surgical History reviewed by me as above   Home Medications: Medication Instructions Last Modified Date/Time  albuterol 2.5 mg/3 mL (0.083%) inhalation solution 3 milliliter(s) inhaled every 6 hours, As Needed - for Shortness of Breath, for Wheezing  26-Jul-15 00:00  ferrous sulfate 325 mg oral tablet 1 tab(s) orally 2 times a day 26-Jul-15 00:00  acetaminophen-HYDROcodone 325 mg-5 mg oral tablet 1 tab(s) orally every 8 hours, As Needed - for Pain 26-Jul-15 00:00  omeprazole 20 mg oral  delayed release tablet 1 tab(s) orally 2 times a day 26-Jul-15 00:00  Lasix 40 mg oral tablet 1 tab(s) orally once a day 26-Jul-15 00:00  metoprolol succinate 25 mg oral tablet, extended release 1 tab(s) orally once a day 26-Jul-15 00:00  metolazone 2.5 mg oral tablet 1 tab(s) orally 2 times a week on Monday and Friday 26-Jul-15 00:00  traMADol 50 mg oral tablet  orally 2 times a day, As Needed - for Pain 26-Jul-15 00:00  potassium chloride 20 mEq oral tablet, extended release  orally once a day 26-Jul-15 00:00  Zofran 4 mg oral tablet 1 tab(s) orally every 8 hours, As Needed - for Nausea, Vomiting 26-Jul-15 00:00  Spiriva 18 mcg inhalation capsule  inhaled once a day 26-Jul-15 00:00  atorvastatin 40 mg oral tablet 1 tab(s) orally once a day (at bedtime) 26-Jul-15 00:00  Xarelto 15 mg oral tablet 1 tab(s) orally once a day (in the evening) 26-Jul-15 00:00  Dulera 5 mcg-100 mcg/inh inhalation aerosol 2 puff(s) inhaled 2 times a day 26-Jul-15 00:00  donepezil 5 mg oral tablet 1 tab(s) orally once a day (at bedtime) 26-Jul-15 00:00  sertraline 50 mg oral tablet  orally once a day (at bedtime) 26-Jul-15 00:00   Allergies:  Penicillin: Rash  Codeine: GI Distress  Levaquin: Rash  Shellfish: N/V/Diarrhea  Social/Family History: Employment Status: retired  Lives With: children  Living Arrangements: house  Social History: no tob, no EtOH, no illicits  Family  History: no strokes, no seizures   Vital Signs: **Vital Signs.:   26-Jul-15 12:12  Vital Signs Type Routine  Temperature Temperature (F) 97.6  Celsius 36.4  Temperature Source oral  Pulse Pulse 85  Respirations Respirations 18  Systolic BP Systolic BP 211  Diastolic BP (mmHg) Diastolic BP (mmHg) 79  Mean BP 93  Pulse Ox % Pulse Ox % 95  Pulse Ox Activity Level  At rest  Oxygen Delivery 2L   Physical Exam: General: nl weight, NAD  HEENT: normocephalic, sclera nonicteric, oropharynx clear  Neck: supple, no JVD, no bruits   Chest: wheezing B with coarse R BS, decent movement  Cardiac: RRR, no murmurs,  2+ pulses  Extremities: vascular congestion of B LE, no clubbing, FROM   Neurologic Exam: Mental Status: alert and oriented x 2 not time,  normal speech and language, follows some complex commands  Cranial Nerves: PERRLA, EOMI, nl VF, face symmetric, tongue midline, shoulder shrug equal  Motor Exam: 5-/5 B, normal tone, no tremor  Deep Tendon Reflexes: 1+/4 B, downgoing Plantars B  Sensory Exam: decreased pin and vibration below waist  Coordination: FTN and HTS WNL   Lab Results: Hepatic:  25-Jul-15 23:07   Bilirubin, Total 0.8  Alkaline Phosphatase 101 (46-116 NOTE: New Reference Range 03/18/14)  SGPT (ALT) 29 (14-63 NOTE: New Reference Range 03/18/14)  SGOT (AST)  40  Total Protein, Serum 7.1  Albumin, Serum  3.2  Routine Chem:  25-Jul-15 23:07   Glucose, Serum  112  BUN 13  Creatinine (comp) 0.73  Sodium, Serum 138  Potassium, Serum 3.6  Chloride, Serum 99  CO2, Serum  34  Calcium (Total), Serum 8.5  Osmolality (calc) 277  eGFR (African American) >60  eGFR (Non-African American) >60 (eGFR values <33m/min/1.73 m2 may be an indication of chronic kidney disease (CKD). Calculated eGFR is useful in patients with stable renal function. The eGFR calculation will not be reliable in acutely ill patients when serum creatinine is changing rapidly. It is not useful in  patients on dialysis. The eGFR calculation may not be applicable to patients at the low and high extremes of body sizes, pregnant women, and vegetarians.)  Anion Gap  5  Cardiac:  26-Jul-15 09:03   CPK-MB, Serum  4.5 (Result(s) reported on 23 Mar 2014 at 09:39AM.)  Troponin I 0.02 (0.00-0.05 0.05 ng/mL or less: NEGATIVE  Repeat testing in 3-6 hrs  if clinically indicated. >0.05 ng/mL: POTENTIAL  MYOCARDIAL INJURY. Repeat  testing in 3-6 hrs if  clinically indicated. NOTE: An increase or decrease  of 30% or more on  serial  testing suggests a  clinically important change)  Routine UA:  26-Jul-15 00:13   Color (UA) Yellow  Clarity (UA) Clear  Glucose (UA) Negative  Bilirubin (UA) Negative  Ketones (UA) Negative  Specific Gravity (UA) 1.014  Blood (UA) 1+  pH (UA) 6.0  Protein (UA) Negative  Nitrite (UA) Negative  Leukocyte Esterase (UA) Trace (Result(s) reported on 23 Mar 2014 at 12:27AM.)  RBC (UA) 11 /HPF  WBC (UA) 14 /HPF  Bacteria (UA) TRACE  Epithelial Cells (UA) 4 /HPF (Result(s) reported on 23 Mar 2014 at 12:27AM.)  Routine Coag:  25-Jul-15 23:07   Prothrombin  22.6  INR 2.0 (INR reference interval applies to patients on anticoagulant therapy. A single INR therapeutic range for coumarins is not optimal for all indications; however, the suggested range for most indications is 2.0 - 3.0. Exceptions to the INR Reference Range may include: Prosthetic heart valves,  acute myocardial infarction, prevention of myocardial infarction, and combinations of aspirin and anticoagulant. The need for a higher or lower target INR must be assessed individually. Reference: The Pharmacology and Management of the Vitamin K  antagonists: the seventh ACCP Conference on Antithrombotic and Thrombolytic Therapy. LNLGX.2119 Sept:126 (3suppl): [id number]. A HCT value >55% may artifactually increase the PT.  In one study,  the increase was an average of 25%. Reference:  "Effect on Routine and Special Coagulation Testing Values of Citrate Anticoagulant Adjustment in Patients with High HCT Values." American Journal of Clinical Pathology 2006;126:400-405.)  Routine Hem:  25-Jul-15 23:07   WBC (CBC) 7.4  RBC (CBC) 4.55  Hemoglobin (CBC) 12.8  Hematocrit (CBC) 40.7  Platelet Count (CBC) 200 (Result(s) reported on 22 Mar 2014 at 11:20PM.)  MCV 90  MCH 28.2  MCHC  31.6  RDW  24.4   Radiology Results: MRI:    26-Jul-15 11:42, MRI Brain Without Contrast  MRI Brain Without Contrast   REASON FOR EXAM:     CVA  COMMENTS:       PROCEDURE: MR  - MR BRAIN WO CONTRAST  - Mar 23 2014 11:42AM     CLINICAL DATA:  Slurred speech.  Stroke.    EXAM:  MRI HEAD WITHOUT CONTRAST    TECHNIQUE:  Multiplanar, multiecho pulse sequences of the brain and surrounding  structures were obtained without intravenous contrast.    COMPARISON:  Head CT 03/22/2014 and MRI 07/06/2012  FINDINGS:  There is no evidence of acute infarct, intracranial hemorrhage,  mass, midline shift, or extra-axial fluid collection.  Encephalomalacia is seen related to a chronic left frontal infarct,  new from prior MRI. A small, chronic right occipital lobe infarct is  also new from prior MRI. Moderate ventricular enlargement is  unchanged from the prior MRI with parenchymal volume loss most  prominent in the temporoparietal regions, unchanged in likely  reflecting advanced, central predominant atrophy. Patchy and  confluent T2 hyperintensities in the periventricular white matter  are similar to the prior MRI and compatible with moderate chronic  small vessel ischemic disease. Chronic ischemic changes are also  noted in the pons, progressed from the prior MRI slightly.    Prior bilateral cataract extraction is noted. Paranasal sinuses and  mastoid air cells are clear. Major intracranial vascular flow voids  are preserved. Upper cervical spondylosis and slight anterolisthesis  of C2 on C3 and C3 on C4 are partially visualized.     IMPRESSION:  1. No evidence of acute intracranial abnormality.  2. Moderate chronic small vessel ischemic disease and remote left  frontal and right occipital cortical infarcts.  3. Advanced cerebral atrophy.      Electronically Signed    By: Logan Bores    On: 03/23/2014 12:23     Verified By: Ferol Luz, M.D.,  CT:    25-Jul-15 23:15, CT Head Without Contrast  CT Head Without Contrast   REASON FOR EXAM:    CVA  COMMENTS:   May transport without cardiac monitor    PROCEDURE: CT  - CT  HEAD WITHOUT CONTRAST  - Mar 22 2014 11:15PM     CLINICAL DATA:  Facial droop.    EXAM:  CT HEAD WITHOUT CONTRAST    TECHNIQUE:  Contiguous axial imageswere obtained from the base of the skull  through the vertex without intravenous contrast.    COMPARISON:  05/17/2013  FINDINGS:  No evidence for acute hemorrhage or abnormal mass lesion. Prominence  of the lateral ventricles  an is associated with diffuse parenchymal  volume loss, suggesting central atrophy. Encephalomalacia in the  spine, posterior left frontal regions suggest remote infarct, in a  watershed distribution. Patchy low attenuation in the deep  hemispheric and periventricular white matter is nonspecific, but  likely reflects chronic microvascular ischemic demyelination.    The visualized paranasal sinuses and mastoid air cells are clear.     IMPRESSION:  Atrophy with chronic small vessel white matter ischemic  demyelination.  Old posterior left frontal infarct.      Electronically Signed    By: Misty Stanley M.D.    On: 03/22/2014 23:48         Verified By: ERIC A. MANSELL, M.D.,   Radiology Impression: Radiology Impression: MRI personally reviewed by me and shows no stroke, severe atrophy and white matter changes   Impression/Recommendations: Recommendations:   prior notes reviewed by me  reviewed by me   Possible seizure-  unclear even with witness but if it was, it was most likely provoked by possible infection Dementia-  clinically better than MRI, stable EEG may need repeat CXR or even consider antibiotics continue aricept will follow  Electronic Signatures: Jamison Neighbor (MD)  (Signed 26-Jul-15 14:22)  Authored: REFERRING PHYSICIAN, Primary Care Physician, Consult, History of Present Illness, Review of Systems, PAST MEDICAL/SURGICAL HISTORY, HOME MEDICATIONS, ALLERGIES, Social/Family History, NURSING VITAL SIGNS, Physical Exam-, LAB RESULTS, RADIOLOGY RESULTS, Recommendations   Last Updated:  26-Jul-15 14:22 by Jamison Neighbor (MD)

## 2014-12-20 NOTE — Discharge Summary (Signed)
PATIENT NAME:  Melanie Wood, Melanie Wood MR#:  [id number] DATE OF BIRTH:  Oct 06, 1929  DATE OF ADMISSION:  05/15/2014 DATE OF DISCHARGE:  05/17/2014  For a detailed note, please see the history and physical done on admission by Dr. Marcille Blanco.   DIAGNOSES AT DISCHARGE: As follows:  1.  Chronic obstructive pulmonary disease exacerbation secondary to pneumonia.  2.  Pneumonia.  3.  Radiation pneumonitis.  4.  History of melanoma.  5.  History of squamous cell lung cancer.  6.  Hypertension.  7.  Dementia.  8.  History of chronic atrial fibrillation.   DIET: The patient is being discharged on a low-sodium diet.   ACTIVITY: As tolerated.   FOLLOWUP: Dr. Adrian Prows in the next 1 to 2 weeks.   DISCHARGE MEDICATIONS: Potassium 20 mEq daily, Zofran 4 mg q. 8 hours as needed, Spiriva 1 puff daily, Dulera 2 puffs b.i.d., iron sulfate 325 mg b.i.d., Lasix 40 mg daily, metoprolol succinate 25 mg daily, metolazone 2.5 mg b.i.d. on Mondays and Fridays, DuoNebs, albuterol q. 6 hours as needed, tramadol 50 mg t.i.d. as needed, Xarelto 20 mg daily, omeprazole 40 mg daily, atorvastatin 40 mg at bedtime, Aricept 5 mg at bedtime, sertraline 50 mg daily, ibuprofen 600 mg q. 6 hours as needed, cefdinir 6 mL q. 12 hours x 5 days, Zithromax 250 mg daily x 5 days, and a prednisone taper starting at 60 mg down to 10 mg over the next 6 days.   PERTINENT STUDIES DONE DURING THE HOSPITAL COURSE: Are as follows: A chest x-ray done on admission showing cardiomegaly. Stable appearance of the right suprahilar region consistent with radiation treatment. The patient's blood cultures noted to be negative.   HOSPITAL COURSE: This is an 81 year old female who presented to the hospital with shortness of breath, noted to be in acute respiratory failure. 1.  COPD exacerbation. This was likely the cause of the patient's shortness of breath and acute respiratory failure. This was suspected to be secondary to pneumonia, suspected nosocomial  pneumonia. The patient had significant wheezing and bronchospasm, therefore, was started on IV steroids and broad-spectrum IV antibiotics with vancomycin and meropenem and Zithromax. The patient after getting aggressive therapy has significantly improved. Her bronchospasm and wheezing have improved. She still has a cough which is nonproductive. She clinically feels much better. She is already on oxygen at home. At this point, the patient is being discharged on a prednisone taper along with oral antibiotics as mentioned above. She will also continue her Dulera and Spiriva as mentioned.  2.  Pneumonia. This was suspected to be a nosocomial pneumonia, therefore, treated with broad-spectrum IV antibiotics with vancomycin, meropenem, and Zithromax. The patient's chest x-ray, although, did not show any focal infiltrates. After getting aggressive IV antibiotic therapy and steroids, her symptoms have improved. She is being discharged on oral cefdinir and Zithromax as stated. 3.  Chronic atrial fibrillation. The patient has remained rate controlled. She will continue her Toprol. She is on Xarelto.  4.  GERD. The patient was maintained on her Protonix and she will resume that.   CODE STATUS: The patient is a DNI/DNR.   DISPOSITION: She is being discharged back to her assisted living.   TIME SPENT WITH THE DISCHARGE: 40 minutes.   ____________________________ Belia Heman. Verdell Carmine, MD vjs:at D: 05/17/2014 14:30:52 ET T: 05/17/2014 18:31:18 ET JOB#: [id number]  cc: Belia Heman. Verdell Carmine, MD, <Dictator> Cheral Marker. Ola Spurr, MD Henreitta Leber MD ELECTRONICALLY SIGNED 05/19/2014 9:45

## 2014-12-20 NOTE — H&P (Signed)
PATIENT NAME:  Melanie Wood, HODGDON MR#:  [id number] DATE OF BIRTH:  1930/05/17  DATE OF ADMISSION:  05/15/2014  REFERRING PHYSICIAN:  Valli Glance. Owens Shark, MD   PRIMARY CARE PHYSICIAN:  Cheral Marker. Ola Spurr, MD  ADMISSION DIAGNOSIS:  Pneumonia.   HISTORY OF PRESENT ILLNESS:  This is an 81 year old Caucasian female who presents to the Emergency Department complaining of weakness and fever. The patient has been feeling progressively worse over the last few days. She always has cough and some wheezing due to COPD, but her daughter, who is a Marine scientist, states that her mother has been worsening. She is chronically on supplemental oxygen at home but has shortness of breath and an increased oxygen requirement here in the hospital. Due to clinical pneumonia and risk factors for hospitalization, the Emergency Department staff called for admission.   REVIEW OF SYSTEMS: CONSTITUTIONAL:  The patient admits to subjective fever and weakness.  ENT:  The patient denies tinnitus or difficulty swallowing.  RESPIRATORY:  The patient admits to cough, wheezing, and shortness of breath.  CARDIOVASCULAR:  The patient denies chest pain or palpitations.  GASTROINTESTINAL:  The patient denies nausea or vomiting.  GENITOURINARY:  The patient denies dysuria, increased frequency, or hesitancy.  ENDOCRINE:  The patient denies polyuria or polydipsia.  HEMATOLOGIC AND LYMPHATIC:  The patient denies easy bruising or bleeding.  INTEGUMENTARY:  The patient denies rashes but admits to some lesions, which have recently been biopsied.  MUSCULOSKELETAL:  The patient denies muscle aches but admits to some chronic joint pain.  NEUROLOGIC:  The patient denies numbness or weakness in her extremities.  PSYCHIATRIC:  The patient denies depression or suicidal ideation.   PAST MEDICAL HISTORY:  COPD, stroke 1 year ago, atrial fibrillation, congestive heart failure, malignant melanoma, and squamous cell carcinoma of the lung.   PAST SURGICAL  HISTORY:  Hip replacements bilaterally, left total knee replacement, and excisional biopsy of malignant melanoma of her lower extremities.   FAMILY HISTORY: Stroke  SOCIAL HISTORY:  The patient has a 30-pack-year history of smoking, but has currently quit. She does not drink or do any drugs. She is a resident of [place], [place].   MEDICATIONS: 1.  Xarelto 20 mg 1 tab p.o. daily.  2.  Albuterol 2.5 mg/3 mL one breathing treatment every 6 hours.  3.  Dulera 5/100 mcg inhaled solution 2 puffs b.i.d.  4.  Ferrous sulfate 325 mg 1 tab p.o. b.i.d.  5.  Lasix 40 mg 1 tab p.o. daily.  6.  Metolazone 2.5 mg 1 tab p.o. every Monday and Friday.  7.  Metoprolol succinate 25 mg extended release 1 tab p.o. daily.  8.  Omeprazole 20 mg 1 tab p.o. b.i.d.  9.  Potassium chloride 20 mEq 1 tab p.o. daily.  10.  Spiriva 18 mcg 1 inhalation daily.  11.  Tramadol 50 mg 1 tab p.o. t.i.d. as needed for pain.  12.  Zofran 4 mg 1 tab p.o. every 8 hours as needed for nausea and vomiting.   ALLERGIES:  CODEINE, LEVAQUIN, PENICILLIN, AND SHELLFISH.   PERTINENT LABORATORY RESULTS AND RADIOGRAPHIC FINDINGS:  Glucose 135, BUN 19, creatinine 0.76, potassium 3.7, total serum albumin 3. White blood cell count is 12.8, hemoglobin 11.8, hematocrit 37.1. Urinalysis shows 15 white blood cells per high-power field, leukocyte esterase negative, nitrite negative, and there are 500 mg/dL of protein and 3+ blood.   Chest x-ray shows stable appearance of the right suprahilar region consistent with radiation treatment. There is  also cardiomegaly. My read shows some hazy, possibly chronic interstitial opacities in the lower lobes bilaterally without any definite infiltrate.   PHYSICAL EXAMINATION: VITAL SIGNS:  Temperature is 100 degrees Fahrenheit, pulse 99, respirations 20, blood pressure 98/70, and pulse oximetry is 94% on 3 L of oxygen via nasal cannula.  GENERAL:  The patient is easily  arousable, oriented x 3, and in no apparent distress.  HEENT:  Normocephalic, atraumatic. Pupils are equal, round, and reactive to light and accommodation. Extraocular movements are intact. Mucous membranes are moist.  NECK:  Trachea is midline. No adenopathy.  CHEST:  Symmetric and atraumatic.  CARDIOVASCULAR:  Irregularly irregular rhythm with normal S1 and S2. No rubs, clicks, or murmurs appreciated.  LUNGS:  Rhonchi in the upper right lobe as well as some rales and rhonchi in the lower lobes bilaterally. Some of these breath sounds clear with cough, but she always has some decreased breath sounds at the bases in addition to bronchial breath sounds. The patient is intermittently tachypneic, but comfortable.  ABDOMEN:  Positive bowel sounds. Soft, nondistended. There is some tenderness in the right lower quadrant that the patient states is more due to the surprise of my abdominal exam. She is not uncomfortable at rest. There is no guarding or rebound tenderness. There is no hepatosplenomegaly.  GENITOURINARY:  Deferred.  MUSCULOSKELETAL:  She has 5/5 strength in upper and lower extremities bilaterally. The patient moves all 4 extremities equally.  SKIN:  No current lesions. There are well healing areas from the excisional biopsy of melanoma. The patient also has some skin changes of her lower extremities consistent with changing fluid balance and venous stasis dermatitis.  NEUROLOGIC:  Cranial nerves II through XII are grossly intact, but the patient does have some occasional word finding difficulty.  PSYCHIATRIC:  Mood is normal. Affect is congruent.   ASSESSMENT AND PLAN:  This is an 81 year old Caucasian female admitted for healthcare-associated pneumonia. She has a history of squamous cell lung cancer status post radiation and chemotherapy. She currently has malignant melanoma that is being assessed by oncology.   1.  Pneumonia. The patient lives in a nursing home, and thus we are treating for  healthcare-associated pneumonia. She has a chronic oxygen requirement that is currently increased, and the patient is intermittently tachypneic and tachycardic. We will wean her oxygen back to baseline as we can. For now, we will continue her on ceftriaxone, vancomycin, and azithromycin.  2.  Sepsis. The patient meets criteria based on leukocytosis and intermittent tachypnea and tachycardia. Blood cultures have been obtained. The patient is hemodynamically stable and currently on antibiotics.  3.  Chronic obstructive pulmonary disease. We will continue the patient's inhalers per her home regimen.  4.  Atrial fibrillation. Continue Xarelto.  5.  Congestive heart failure. The patient is stable. We will continue metoprolol and her schedule of Lasix and metolazone in addition to her potassium supplement.  6.  Malignant melanoma status post excisional biopsy. The primary care team may wish to contact the patient's oncologist tomorrow, as she reportedly has a PET scan scheduled.  7.  Cerebrovascular accident, stable. The patient does not have any motor deficits at this time. It is grossly important for her to stay on Xarelto, as she suffered the stroke following hip surgery when her medicine was discontinued for 3 days.  8.  Deep vein thrombosis prophylaxis. Xarelto, as well as compression stockings.  9.  Gastrointestinal prophylaxis. Pantoprazole.   The patient is a DO NOT RESUSCITATE. She has  a living will established, and her daughter is her healthcare [place]. Again, she does not want resuscitative measures.   Time spent on patient care and admission orders:  Approximately 40 minutes.    ____________________________ Norva Riffle. Marcille Blanco, MD msd:nb D: 05/15/2014 05:45:02 ET T: 05/15/2014 06:10:52 ET JOB#: [id number]  cc: Norva Riffle. Marcille Blanco, MD, <Dictator> Norva Riffle Shiasia Porro MD ELECTRONICALLY SIGNED 05/17/2014 0:00

## 2014-12-20 NOTE — Discharge Summary (Signed)
PATIENT NAME:  Melanie Wood, Melanie Wood MR#:  [id number] DATE OF BIRTH:  1929-12-16  DATE OF ADMISSION:  09/27/2013 DATE OF DISCHARGE: 10/01/2013    DISCHARGE DIAGNOSES:  1.  Community-acquired pneumonia.  2.  Congestive heart failure.  3.  Hypertension.   HISTORY OF PRESENT ILLNESS: Please see initial history and physical for details. Briefly, this is an 81 year old female, who lives at [facility]. She has a history of CHF and COPD. She was admitted with shortness of breath. In the ER, she was started on treatment for community-acquired pneumonia as well as COPD exacerbation with steroids and nebulizers and levofloxacin. Clinically, she improved and was able to wean off of oxygen. On the day of discharge, she is sitting up in bed with no hypoxia. Her respiratory rate is stable. She does continue to cough and have a mild wheeze, but is clinically improved.   DISCHARGE MEDICATIONS: Please see discharge summary.   DISCHARGE CONDITION: Stable.   DISCHARGE DIET: Low sodium, regular consistency.   Home health will be needed for physical therapy.   DISCHARGE ACTIVITY: As tolerated.   FOLLOWUP: The patient is to follow up with Dr. Ola Spurr in 1 to 2 weeks.   TIME SPENT: 40 minutes. ____________________________ Cheral Marker. Ola Spurr, MD dpf:aw D: 10/01/2013 08:34:34 ET T: 10/01/2013 10:31:31 ET JOB#: [id number]  cc: Cheral Marker. Ola Spurr, MD, <Dictator> DAVID Ola Spurr MD ELECTRONICALLY SIGNED 10/01/2013 14:25

## 2014-12-20 NOTE — H&P (Signed)
PATIENT NAME:  Melanie Wood, Melanie Wood MR#:  [id number] DATE OF BIRTH:  07-29-1930  DATE OF ADMISSION:  09/27/2013  REFERRING PHYSICIAN: Dr. Karma Greaser.   PRIMARY CARE PHYSICIAN: Dr. Ola Spurr at [facility].   CHIEF COMPLAINT: Weakness.   HISTORY OF PRESENT ILLNESS: This is an 81 year old Caucasian female with history of lung cancer, squamous cell carcinoma, congestive heart failure, COPD non-O2 dependent, presenting with generalized weakness. Describes 1 to 2 days' duration of weakness apparently at her assisted living facility. Generalized weakness and fatigue. She actually had a mechanical fall today without head trauma or loss of consciousness and then subsequently brought to the Emergency Department. She, however, denies any shortness of breath, cough, fever, chills or recent sick contacts; however, on arrival to the Emergency Department, she was found to be hypoxemic requiring supplemental O2, as well as febrile. Initial chest x-ray findings suggestive of pneumonia. Currently, the patient is without complaints.   REVIEW OF SYSTEMS: Question the reliability of this data but:  CONSTITUTIONAL: Positive for generalized weakness, fatigue. Denies any fevers.  EYES: Denies blurred vision, double vision, eye pain.  ENT: Denies tinnitus, ear pain, hearing loss.  RESPIRATORY: Denies cough, wheeze, shortness of breath despite actively coughing.  CARDIOVASCULAR: No chest pain, palpitations, edema.  GASTROINTESTINAL: No nausea, vomiting, diarrhea.  GENITOURINARY: Denies dysuria or hematuria.  ENDOCRINE: Denies nocturia, thyroid problems.  HEMATOLOGIC AND LYMPHATIC: Denies easy bruising, bleeding.  SKIN: Denies rash or lesion.  MUSCULOSKELETAL: Denies pain in neck, back, shoulder, knees, hips or arthritic symptoms.  NEUROLOGIC: Denies paralysis, paresthesias.  PSYCHIATRIC: Denies anxiety or depressive symptoms.   Otherwise, full review of systems performed by me is negative.   PAST MEDICAL HISTORY:  Lung cancer, squamous cell carcinoma, congestive heart failure, Alzheimer's, COPD non-O2 dependent, atrial fibrillation on Xarelto and history of stroke.   SOCIAL HISTORY: Remote tobacco abuse. Denies any alcohol or drug use. Apparently lives in assisted living facility.   FAMILY HISTORY: Positive for CVAs.   ALLERGIES: CODEINE, PENICILLIN AND SHELLFISH.   HOME MEDICATIONS: Include tramadol 50 mg p.o. b.i.d. as needed for pain, lisinopril 2.5 mg 1/2 tab p.o. daily, Xarelto 50 mg p.o. daily, sertraline 50 mg p.o. at bedtime, Zofran 4 mg p.o. q.8 hours as needed for nausea and vomiting, atorvastatin 40 mg p.o. at bedtime, Dulera 5/100 mcg inhalation 2 puffs b.i.d., Spiriva 18 mcg inhalation daily, DuoNeb therapy 2.5/0.5 mL inhalation every 6 hours as needed for shortness of breath, donepezil 5 mg p.o. daily, bacitracin topical applied to affected area of left thigh daily, Lasix 20 mg p.o. daily, potassium 20 mEq p.o. daily, Prilosec 20 mg p.o. daily.   PHYSICAL EXAMINATION:  VITAL SIGNS: Temperature 100.5 degrees Fahrenheit, heart rate 125, respirations 20, blood pressure 167/63, saturating 89% on room air. Currently saturating 96% on 2 liters nasal cannula. Weight 80.7 kg, BMI 32.6.  GENERAL: Well-nourished, well-developed, Caucasian female, currently in no acute distress.  HEAD: Normocephalic, atraumatic.  EYES: Pupils equal, round and reactive to light. Extraocular movements intact. No scleral icterus.  MOUTH: Moist mucous membranes. Dentition intact. No abscess noted.  EARS, NOSE, THROAT: Throat clear without exudates. No external lesions.  NECK: Supple. No thyromegaly. No nodules. No JVD.  PULMONARY: Diffuse coarse breath sounds with scattered rhonchi most prominent on the right side. Prolonged expiratory phase with expiratory wheezing. No use of accessory muscles. Good respiratory effort.  CHEST: Nontender to palpation.  CARDIOVASCULAR: S1, S2, tachycardic, irregular rate, irregular rhythm.  No murmurs, rubs or gallops. No edema. Pedal pulses 2+ bilaterally.  GASTROINTESTINAL: Soft, nontender, nondistended. No masses. Positive bowel sounds. No hepatosplenomegaly.  MUSCULOSKELETAL: No swelling, clubbing, edema. Range of motion full in all extremities.  NEUROLOGIC: Cranial nerves II through XII intact. No gross focal neurological deficits. Sensation intact. Reflexes intact.  SKIN: No ulcerations, lesions, rashes, cyanosis. Skin warm, dry. Turgor intact.  PSYCHIATRIC: Mood and affect within normal limits. The patient is awake, alert and oriented x 3. Insight and judgment intact.   LABORATORY DATA: Sodium 136, potassium 3.2, chloride 100, bicarb 29, BUN 12, , glucose 124. LFTs within normal limits. Troponin I less than 0.02. WBC 10.6, hemoglobin 10.5, platelets of 280. Urinalysis negative for evidence of infection. EKG: Atrial fibrillation, heart rate of 117. No ST-T wave abnormalities. Chest x-ray revealing patchy interstitial infiltrate of the right lung field suggestive of pneumonia.   ASSESSMENT AND PLAN: An 81 year old Caucasian female with history of lung cancer, squamous cell carcinoma, congestive heart failure, chronic obstructive pulmonary disease, presenting with generalized weakness. Found to be hypoxemic and febrile on arrival.  1. Sepsis: Meeting septic criteria by fever and tachycardia. Obtain blood culture, sputum culture. Intravenous fluid to keep MAP greater than 65, Antibiotic coverage with Levaquin. Dosed vancomycin once in the Emergency Room. Likely will not need to continue this medication.   2. Community-acquired pneumonia: Continue supplemental oxygen to keep oxygen saturation greater than 90%. Provide DuoNeb therapy q.4 hours as well as flutter valve therapy.  3. Atrial fibrillation with rapid ventricular response: Will place on telemetry. Intravenous fluids for blood pressure. If there is no improvement with improvement of blood pressures, if no omprovement Cardizem 10  mg intravenous and follow for response. Suspect that her heart rate is most likely related to her infection.  4. Chronic obstructive pulmonary disease: Continue her home medications of Dulera as well as Spiriva.  5. Venous thromboembolism prophylaxis: She is on Xarelto therapy.   The patient is FULL CODE.   TIME SPENT: 45 minutes.    ____________________________ Aaron Mose. Hower, MD dkh:gb D: 09/27/2013 22:34:04 ET T: 09/27/2013 23:27:14 ET JOB#: [id number]  cc: Aaron Mose. Hower, MD, <Dictator> DAVID Woodfin Ganja MD ELECTRONICALLY SIGNED 09/29/2013 20:32

## 2014-12-21 LAB — BASIC METABOLIC PANEL
ANION GAP: 6 — AB (ref 7–16)
BUN: 28 mg/dL — AB
CHLORIDE: 102 mmol/L
Calcium, Total: 8.2 mg/dL — ABNORMAL LOW
Co2: 29 mmol/L
Creatinine: 0.52 mg/dL
EGFR (Non-African Amer.): >60
GLUCOSE: 190 mg/dL — AB
Potassium: 5 mmol/L
SODIUM: 137 mmol/L

## 2014-12-21 LAB — HEMOGLOBIN: HGB: 10.9 g/dL — AB (ref 12.0–16.0)

## 2014-12-21 LAB — MAGNESIUM: MAGNESIUM: 2.4 mg/dL

## 2014-12-23 LAB — CULTURE, BLOOD (SINGLE)

## 2014-12-24 LAB — COMPREHENSIVE METABOLIC PANEL
ALK PHOS: 68 U/L
ALT: 28 U/L
ANION GAP: 6 — AB (ref 7–16)
AST: 32 U/L
Albumin: 2.4 g/dL — ABNORMAL LOW
BUN: 32 mg/dL — ABNORMAL HIGH
Bilirubin,Total: 0.5 mg/dL
CHLORIDE: 94 mmol/L — AB
CO2: 34 mmol/L — AB
Calcium, Total: 7.9 mg/dL — ABNORMAL LOW
Creatinine: 0.66 mg/dL
EGFR (Non-African Amer.): >60
GLUCOSE: 128 mg/dL — AB
Potassium: 4.8 mmol/L
Sodium: 134 mmol/L — ABNORMAL LOW
Total Protein: 5.7 g/dL — ABNORMAL LOW

## 2014-12-24 LAB — CBC CANCER CENTER
Bands: 1 %
Comment - H1-Com7: 0
EOS PCT: 1 %
HCT: 35.4 % (ref 35.0–47.0)
HGB: 11.6 g/dL — AB (ref 12.0–16.0)
Lymphocytes: 7 %
MCH: 29.1 pg (ref 26.0–34.0)
MCHC: 32.7 g/dL (ref 32.0–36.0)
MCV: 89 fL (ref 80–100)
Monocytes: 3 %
Myelocyte: 1 %
Platelet: 349 x10 3/mm (ref 150–440)
RBC: 3.98 10*6/uL (ref 3.80–5.20)
RDW: 16.6 % — ABNORMAL HIGH (ref 11.5–14.5)
Segmented Neutrophils: 87 %
WBC: 14.3 x10 3/mm — ABNORMAL HIGH (ref 3.6–11.0)

## 2014-12-24 LAB — MAGNESIUM: MAGNESIUM: 2.2 mg/dL

## 2014-12-24 LAB — TSH: THYROID STIMULATING HORM: 5.404 u[IU]/mL — AB

## 2014-12-28 NOTE — Discharge Summary (Signed)
PATIENT NAME:  Melanie Wood, Melanie Wood MR#:  [id number] DATE OF BIRTH:  05-Apr-1930  DATE OF ADMISSION:  12/19/2014 DATE OF DISCHARGE:    ADDENDUM:  The patient's discharge was suspended since the patient's daughters have concerns about the patient abdominal edema and walking. The patient received the Lasix and feels better. The patient will be discharged to a nursing home today. Discussed the patient's discharge plan with the patient and the patient's 2 daughters, nurse, and Education officer, museum.   TIME SPENT: About 42 minutes.    ____________________________ Demetrios Loll, MD qc:at D: 12/23/2014 10:35:57 ET T: 12/23/2014 10:53:30 ET JOB#: [id number]  cc: Demetrios Loll, MD, <Dictator> Demetrios Loll MD ELECTRONICALLY SIGNED 12/23/2014 15:15

## 2014-12-28 NOTE — Discharge Summary (Signed)
PATIENT NAME:  Melanie Wood, Melanie Wood MR#:  [id number] DATE OF BIRTH:  23-May-1930  DATE OF ADMISSION:  12/19/2014 DATE OF DISCHARGE:  12/22/2014  PRIMARY CARE PHYSICIAN: Adrian Prows, MD  DISCHARGE DIAGNOSES:  1.  Chronic obstructive pulmonary disease exacerbation with acute bronchitis. 2.  Chronic respiratory failure, on home oxygen.  3.  Elevated troponin due to demand ischemia.  4.  Atrial fibrillation. 5.  Cell lung carcinoma.  6.  Dementia.   CONDITION: Stable.   CODE STATUS: DNR.  HOME MEDICATIONS: Please refer to the medication reconciliation list.   HOME OXYGEN: 1 to 2 liters by nasal cannula and p.r.n.   DISCHARGE DIET: Low-sodium, low-fat, low-cholesterol diet.   DISCHARGE ACTIVITY: As tolerated.   FOLLOW-UP CARE: Follow up with PCP within 1 to 2 weeks. Also, the patient needs follow up with Dr. Grayland Ormond for lung cancer.   REASON FOR ADMISSION: Altered mental status.   HOSPITAL COURSE: The patient is an 81 year old Caucasian female with a history of COPD, chronic respiratory failure on 1 liter oxygen at home. The patient was sent to ED due to altered mental status for 2 days. In addition, the patient has a productive cough with sputum. For detailed history and physical examination, please refer to the admission note dictated by Dr. Lavetta Nielsen. The patient was admitted for COPD exacerbation. After admission the patient has been treated with DuoNeb, IV Solu-Medrol, Zithromax and Rocephin. The patient's symptoms have much improved. Lung sounds only has some mild wheezing. The patient is on oxygen, on and off. We will change to p.o. prednisone taper after discharge.   The patient also has elevated troponin which is possibly due to demand ischemia. The patient has been treated with aspirin and statin.   The patient has a history of A-fib, has been on Xarelto. Will continue Xarelto after discharge.   The patient is clinically stable and will be discharged to nursing home today. I  discussed the patient's discharge plan with the patient, the patient's daughter, nurse, case Freight forwarder and Education officer, museum.   TIME SPENT: About 42 minutes.   ____________________________ Demetrios Loll, MD qc:sb D: 12/22/2014 11:05:14 ET T: 12/22/2014 12:37:15 ET JOB#: [id number]  cc: Demetrios Loll, MD, <Dictator> Demetrios Loll MD ELECTRONICALLY SIGNED 12/22/2014 17:16

## 2014-12-28 NOTE — H&P (Signed)
PATIENT NAME:  Melanie Wood, FAGERSTROM MR#:  [id number] DATE OF BIRTH:  [birth year]  DATE OF ADMISSION:  12/18/2014  REFERRING PHYSICIAN:  Valli Glance. Owens Shark, MD    PRIMARY CARE PHYSICIAN: Cheral Marker. Ola Spurr, MD at the [facility]: Altered mental status.    HISTORY OF PRESENT ILLNESS: This is an 81 year old Caucasian female with a past medical history of COPD, chronic respiratory failure, 1 liter nasal cannula at baseline, small cell lung carcinoma as well as atrial fibrillation, on Xarelto  for anticoagulation, presenting with fall and altered mental status. The patient is able to provide minimal history given mental status. History is aided by a daughter who is present at bedside. Presenting essentially after a mechanical fall today while at her nursing facility, however, per daughter, stating that she was found without oxygen on for an unknown period of time. The daughter also mentioned that the patient is becoming progressively weak with altered mental status described as worsening confusion over the last 2 days' duration. This is associated with cough productive of sputum, however, they are uncertain to what the sputum looks like. Denies any fevers, chills, further symptomatology.   REVIEW OF SYSTEMS: Unobtainable given patient's mental status and medical condition.   PAST MEDICAL HISTORY: Includes COPD, chronic respiratory failure, 1 liter of nasal cannula at baseline, dementia, conversant at baseline however confused, atrial fibrillation, on Xarelto, small cell lung carcinoma.   SOCIAL HISTORY: Positive for remote tobacco use. No alcohol or drug usage. Currently resides in a nursing facility.   FAMILY HISTORY: Positive for CVA.   ALLERGIES: CODEINE, LEVAQUIN, PENICILLIN, AND SHELLFISH.   HOME MEDICATIONS: Include Xarelto 20 mg p.o. q. daily, Lamotrigine 25 mg 2 tabs p.o. b.i.d., sertraline 50 mg p.o. q. daily, atorvastatin 40 mg p.o. q. daily, metoprolol 25 mg p.o. q. daily,  Dulera 5/100 mcg 1 inhalation 2 puffs b.i.d., Spiriva 18 mcg inhalation daily, donepezil 5 mg p.o. at bedtime, Lasix 40 mg p.o. b.i.d., Feosol 325 mg p.o. b.i.d.; potassium 20 mEq p.o. b.i.d., Prilosec 40 mg p.o. q. daily.   PHYSICAL EXAMINATION:  VITAL SIGNS: Temperature 102.2, heart rate 95, respirations 18, blood pressure 100/54, saturating 94% on supplemental O2, weight is 78 kg, BMI of 33.6.  GENERAL: Chronically ill, frail-appearing Caucasian female currently in minimal distress given respiratory status.  HEAD: Normocephalic, atraumatic.  EYES: Pupils equal, round, reactive to light. Extraocular muscles intact. No scleral icterus.  MOUTH: Dry mucosal membrane. Dentition intact. No abscess noted.  EAR, NOSE, THROAT: Clear without exudates. No external lesions.  NECK: Supple. No thyromegaly. No nodules. No JVD.  PULMONARY: Expiratory wheezing heard throughout the majority of lung fields as well as coarse rhonchi most prominent right mid lung. No use of accessory muscles. Good respiratory effort.  CHEST: Nontender to palpation.  CARDIOVASCULAR: S1 and S2, regular rate and rhythm. No murmurs, rubs, or gallops. No edema. Pedal pulses 2+ bilaterally. GASTROINTESTINAL: Soft, nontender, nondistended. No masses. Positive bowel sounds. No hepatosplenomegaly.  MUSCULOSKELETAL: No swelling, clubbing. Positive for lower extremity edema; has compression stockings on, at baseline, this is unchanged. Range of motion full in all extremities.  NEUROLOGIC: Cranial nerves II through XII intact. No gross focal neurological deficits. Sensation intact. Reflexes intact.  SKIN: Ulcerations consistent with her melanoma over the left lower extremity. No other lesions, rashes, cyanosis. Skin warm and dry. Turgor intact.  PSYCHIATRIC: Mood, affect blunted. She is awake, alert, oriented to person and place. Does have some difficulty answering questions and providing meaningful information.  Insight and judgment appear to be  poor at this time.   LABORATORY DATA: CT head performed, which reveals no acute findings. CT chest performed, which reveals bronchitis, no evidence of PE.   Sodium of 137, potassium 3.9, chloride 97, bicarbonate 32, BUN 19, creatinine 0.57, glucose of 123, troponin 0.18, WBC of 9, hemoglobin of 11.9, platelets of 267,000.   ASSESSMENT AND PLAN: An 81 year old Caucasian female with history of chronic obstructive pulmonary disease and chronic respiratory failure, on 1 liter of nasal cannula at baseline as well as small cell lung carcinoma, status post treatment, presenting with altered mental status and fall.   1.  Chronic obstructive pulmonary disease exacerbation. DuoNeb treatment, supplemental O2 to keep O2 saturation greater than 92%, Solu-Medrol 60 mg IV q. daily, azithromycin, we will add ceftriaxone as the patient is now febrile. We will also add  blood cultures before antibiotics are initiated.  2.  Elevated troponin. Place on telemetry, initiate aspirin and statin therapy, trend cardiac enzymes x 3. If upward trending, can start heparin drip.    Marland Kitchen   4.  Gastroesophageal reflux disease without esophagitis, proton pump inhibitor therapy.  5.  Atrial fibrillation, continue Xarelto, in normal sinus rhythm.   6.  Venous thromboembolic prophylaxis, on Xarelto.   CODE STATUS: The patient is DNR.    TIME SPENT: 45 minutes.   ____________________________ Aaron Mose. Gigi Onstad, MD dkh:AT D: 12/18/2014 03:10:48 ET T: 12/18/2014 03:43:06 ET JOB#: [id number]  cc: Aaron Mose. Rochel Privett, MD, <Dictator> Kenidi Elenbaas Woodfin Ganja MD ELECTRONICALLY SIGNED 12/18/2014 20:34

## 2014-12-28 NOTE — Consult Note (Signed)
PATIENT NAME:  Melanie Wood, Melanie Wood MR#:  [id number] DATE OF BIRTH:  [birth year]  DATE OF CONSULTATION:  12/19/2014  REFERRING PHYSICIAN:   CONSULTING PHYSICIAN:  Lisvet Rasheed R. Ma Hillock, MD  REFERRING PHYSICIAN: Dr. Bridgett Larsson.     CONSULTING PHYSICIAN: Dr. Ma Hillock.    REASON FOR CONSULTATION: Metastatic melanoma, admitted with COPD and altered mental status.   HISTORY OF PRESENT ILLNESS: The patient is an 81 year old female with known history of stage IV metastatic melanoma, COPD on oxygen most of the time, and other medical problems as described below, who has stage IV metastatic melanoma and is on treatment with combination of ipilimumab and nivolumab, received second dose on April 6 by Dr. Grayland Ormond. The patient has been admitted to hospital after she had a fall, progressive dyspnea, and also was reportedly confused. The patient's daughter at bedside however states that she had been off of oxygen for a while when she was found in this manner. States that her mental status has cleared back to baseline after she was given oxygen. The patient also had cough, sputum, dyspnea, and some low-grade fever and she is currently on Rocephin and Zithromax IV. Blood cultures are negative so far. Clinically states that she is beginning to feel slightly better, although she has significant dyspnea and wheezing and does not ambulate much at this time. No new headaches. CT head without contrast does not show any obvious brain metastasis. No new headaches, double vision, imbalance.   PAST MEDICAL HISTORY AND PAST SURGICAL HISTORY:  Stage IV melanoma, COPD and chronic respiratory failure on nasal cannula oxygen, dementia, lung cancer, atrial fibrillation.   FAMILY HISTORY: Remarkable for CVA.   SOCIAL HISTORY: Remarkable for smoking. No alcohol or drug usage. Resides in the facility.   HOME MEDICATIONS:  Xarelto 20 mg daily, lamotrigine 25 mg 2 tablets b.i.d., sertraline 50 mg daily, atorvastatin 40 mg daily, metoprolol 25 mg  daily, Spiriva inhaler 18 mcg daily, donepezil 5 mg at bedtime, Lasix 40 mg b.i.d., ferrous sulfate 325 mg b.i.d., potassium 20 mEq b.i.d., Prilosec 40 mg daily, Dulera 5/100 mcg inhaler 2 puffs b.i.d.   ALLERGIES: INCLUDE LEVAQUIN, PENICILLIN, SHELLFISH, AND CODEINE.   REVIEW OF SYSTEMS:  CONSTITUTIONAL: As in HPI. Currently denies fever or chills.  HEENT: Currently denies headaches or dizziness at rest. No epistaxis, ear or jaw pain.  CARDIAC: No angina, palpitation, orthopnea.  LUNGS: As in HPI. No hemoptysis.  GASTROINTESTINAL: No nausea, vomiting, or diarrhea. No bright red blood in stools or melena.  GENITOURINARY: No dysuria or hematuria.  MUSCULOSKELETAL: Denies new bone pains.  EXTREMITIES: No new swelling or pain, states she has chronic mild swelling.  NEUROLOGIC: No new focal weakness, seizures, otherwise as in HPI.   ENDOCRINE: No polyuria or polydipsia. Appetite borderline.   PHYSICAL EXAMINATION:  GENERAL: The patient is weak and tired-looking, resting in bed, otherwise alert and oriented to self, place, and person, and converses appropriately. No icterus. Mild pallor.  VITAL SIGNS: 98.4, 89, 19, 122/63, 94% on 2L.  HEENT: Normocephalic, atraumatic. Extraocular movements intact. Sclerae anicteric.  NECK: Negative for lymphadenopathy.  CARDIOVASCULAR: S1, S2, regular rate and rhythm.  LUNGS: Bilateral diminished breath sounds overall with rhonchi. No crepitations.  ABDOMEN: Soft, nontender. No hepatomegaly.  EXTREMITIES: Show mild edema, no cyanosis.  NEUROLOGIC: Cranial nerves intact, moves all extremities spontaneously. Gait not checked.   LABORATORY RESULTS: Hemoglobin 10.4, platelets 254,000, WBC 4800, ANC 4300. Creatinine 0.7. Blood sugar 220. Calcium 8.0. Blood culture negative so far.   CT chest negative for  PE, reports bronchitis and treated right upper lobe lung cancer. CT head noncontrast negative for metastasis, there is volume loss and white matter changes  similar to prior.   IMPRESSION AND RECOMMENDATIONS: An 81 year old female patient with known history of stage IV metastatic melanoma, chronic obstructive pulmonary disease on continuous oxygen, and other medical problems as described above, who has been admitted after fall, along with progressive dyspnea, low-grade fever, and altered mental status which was likely because she was not using her oxygen. Per her daughter, confusion has improved after she got back on oxygen. The patient currently is weak and has poor performance status, has chronic obstructive pulmonary disease exacerbation, and is on broad-spectrum antibiotic coverage. Agree with ongoing supportive treatment with antibiotics, bronchodilators. She has been on targeted therapy for metastatic melanoma. She does not have any cytopenias at this time, has chronic mild anemia. No major pain issues at this time. The patient and daughter are aware that stage IV melanoma is incurable and overall prognosis is poor, also she has advanced COPD with chronic respiratory failure which makes overall prognosis also poor. The patient is Do Not Resuscitate status. No acute oncology issues at this time. We will continue to follow during hospitalization as indicated, otherwise if she is discharged soon she was advised to keep outpatient cancer center appointments the same as scheduled. They are agreeable to this plan.   Thank you for the referral, please feel free to contact me if any additional questions.      ____________________________ Rhett Bannister Ma Hillock, MD srp:bu D: 12/19/2014 14:12:05 ET T: 12/19/2014 14:41:04 ET JOB#: [id number]  cc: Nailyn Dearinger R. Ma Hillock, MD, <Dictator> Alveta Heimlich MD ELECTRONICALLY SIGNED 12/19/2014 23:42

## 2015-01-04 ENCOUNTER — Other Ambulatory Visit: Payer: Self-pay | Admitting: Oncology

## 2015-01-04 DIAGNOSIS — C439 Malignant melanoma of skin, unspecified: Secondary | ICD-10-CM

## 2015-01-06 ENCOUNTER — Other Ambulatory Visit: Payer: Self-pay | Admitting: Oncology

## 2015-01-06 DIAGNOSIS — C439 Malignant melanoma of skin, unspecified: Secondary | ICD-10-CM

## 2015-01-06 MED ORDER — LIDOCAINE-PRILOCAINE 2.5-2.5 % EX CREA: TOPICAL_CREAM | CUTANEOUS | Status: DC

## 2015-01-07 ENCOUNTER — Inpatient Hospital Stay: Payer: Medicare Other | Attending: Oncology

## 2015-01-07 ENCOUNTER — Inpatient Hospital Stay (HOSPITAL_BASED_OUTPATIENT_CLINIC_OR_DEPARTMENT_OTHER): Payer: Medicare Other | Admitting: Oncology

## 2015-01-07 ENCOUNTER — Encounter: Payer: Self-pay | Admitting: Oncology

## 2015-01-07 ENCOUNTER — Inpatient Hospital Stay: Payer: Medicare Other

## 2015-01-07 VITALS — BP 116/62 | HR 89 | Temp 98.0°F | Resp 18

## 2015-01-07 VITALS — BP 114/60 | HR 91 | Temp 98.2°F | Resp 18 | Wt 168.8 lb

## 2015-01-07 DIAGNOSIS — D509 Iron deficiency anemia, unspecified: Secondary | ICD-10-CM | POA: Diagnosis not present

## 2015-01-07 DIAGNOSIS — R569 Unspecified convulsions: Secondary | ICD-10-CM | POA: Diagnosis not present

## 2015-01-07 DIAGNOSIS — C771 Secondary and unspecified malignant neoplasm of intrathoracic lymph nodes: Secondary | ICD-10-CM | POA: Insufficient documentation

## 2015-01-07 DIAGNOSIS — I517 Cardiomegaly: Secondary | ICD-10-CM | POA: Insufficient documentation

## 2015-01-07 DIAGNOSIS — C801 Malignant (primary) neoplasm, unspecified: Secondary | ICD-10-CM | POA: Diagnosis not present

## 2015-01-07 DIAGNOSIS — Z79899 Other long term (current) drug therapy: Secondary | ICD-10-CM | POA: Insufficient documentation

## 2015-01-07 DIAGNOSIS — F0390 Unspecified dementia without behavioral disturbance: Secondary | ICD-10-CM

## 2015-01-07 DIAGNOSIS — Z87891 Personal history of nicotine dependence: Secondary | ICD-10-CM | POA: Diagnosis not present

## 2015-01-07 DIAGNOSIS — I2510 Atherosclerotic heart disease of native coronary artery without angina pectoris: Secondary | ICD-10-CM | POA: Diagnosis not present

## 2015-01-07 DIAGNOSIS — C439 Malignant melanoma of skin, unspecified: Secondary | ICD-10-CM

## 2015-01-07 DIAGNOSIS — I10 Essential (primary) hypertension: Secondary | ICD-10-CM

## 2015-01-07 DIAGNOSIS — I4891 Unspecified atrial fibrillation: Secondary | ICD-10-CM | POA: Insufficient documentation

## 2015-01-07 DIAGNOSIS — Z5111 Encounter for antineoplastic chemotherapy: Secondary | ICD-10-CM | POA: Insufficient documentation

## 2015-01-07 LAB — COMPREHENSIVE METABOLIC PANEL
ALT: 13 U/L — ABNORMAL LOW (ref 14–54)
AST: 23 U/L (ref 15–41)
Albumin: 2.4 g/dL — ABNORMAL LOW (ref 3.5–5.0)
Alkaline Phosphatase: 62 U/L (ref 38–126)
Anion gap: 4 — ABNORMAL LOW (ref 5–15)
BUN: 11 mg/dL (ref 6–20)
CHLORIDE: 103 mmol/L (ref 101–111)
CO2: 29 mmol/L (ref 22–32)
CREATININE: 0.45 mg/dL (ref 0.44–1.00)
Calcium: 7.8 mg/dL — ABNORMAL LOW (ref 8.9–10.3)
GFR calc Af Amer: >60 mL/min (ref >60)
Glucose, Bld: 124 mg/dL — ABNORMAL HIGH (ref 65–99)
Potassium: 4.7 mmol/L (ref 3.5–5.1)
SODIUM: 136 mmol/L (ref 135–145)
Total Bilirubin: 0.5 mg/dL (ref 0.3–1.2)
Total Protein: 6 g/dL — ABNORMAL LOW (ref 6.5–8.1)

## 2015-01-07 LAB — CBC WITH DIFFERENTIAL/PLATELET
BASOS ABS: 0.1 10*3/uL (ref 0–0.1)
BASOS PCT: 1 %
Eosinophils Absolute: 0.2 10*3/uL (ref 0–0.7)
Eosinophils Relative: 4 %
HEMATOCRIT: 29.7 % — AB (ref 35.0–47.0)
HEMOGLOBIN: 9.6 g/dL — AB (ref 12.0–16.0)
Lymphocytes Relative: 7 %
Lymphs Abs: 0.4 10*3/uL — ABNORMAL LOW (ref 1.0–3.6)
MCH: 29.1 pg (ref 26.0–34.0)
MCHC: 32.5 g/dL (ref 32.0–36.0)
MCV: 89.6 fL (ref 80.0–100.0)
MONOS PCT: 6 %
Monocytes Absolute: 0.4 10*3/uL (ref 0.2–0.9)
NEUTROS ABS: 5 10*3/uL (ref 1.4–6.5)
Neutrophils Relative %: 82 %
PLATELETS: 274 10*3/uL (ref 150–440)
RBC: 3.31 MIL/uL — AB (ref 3.80–5.20)
RDW: 16.9 % — ABNORMAL HIGH (ref 11.5–14.5)
WBC: 6.1 10*3/uL (ref 3.6–11.0)

## 2015-01-07 MED ORDER — HEPARIN SOD (PORK) LOCK FLUSH 100 UNIT/ML IV SOLN
500.0000 [IU] | Freq: Once | INTRAVENOUS | Status: DC
Start: 2015-01-07 — End: 2015-01-07

## 2015-01-07 MED ORDER — SODIUM CHLORIDE 0.9 % IJ SOLN
10.0000 mL | INTRAMUSCULAR | Status: DC | PRN
  Filled 2015-01-07: qty 10

## 2015-01-07 MED ORDER — SODIUM CHLORIDE 0.9 % IV SOLN
3.0000 mg/kg | Freq: Once | INTRAVENOUS | Status: AC
  Administered 2015-01-07: 245 mg via INTRAVENOUS
  Filled 2015-01-07: qty 40

## 2015-01-07 MED ORDER — SODIUM CHLORIDE 0.9 % IV SOLN
1.0000 mg/kg | Freq: Once | INTRAVENOUS | Status: AC
  Administered 2015-01-07: 80 mg via INTRAVENOUS
  Filled 2015-01-07: qty 8

## 2015-01-07 MED ORDER — SODIUM CHLORIDE 0.9 % IV SOLN
Freq: Once | INTRAVENOUS | Status: AC
  Administered 2015-01-07: 11:00:00 via INTRAVENOUS
  Filled 2015-01-07: qty 1000

## 2015-01-07 NOTE — Progress Notes (Signed)
[facility]  [phone] [phone]  ID: Carolin Coy OB: 02-20-1930  MR#: [id number]  DUK#:[id number]  Patient Care Team: Adrian Prows, MD as PCP - General (Infectious Diseases)  CHIEF COMPLAINT:  Chief Complaint  Patient presents with  . Follow-up  . Chemotherapy    INTERVAL HISTORY: Patient returns to clinic today for further evaluation and consideration of cycle 3 of ipilimumab and nivolumab. Her performance status has improved and she is nearly back to her baseline. She continues to have chronic shortness of breath, but this is improved as well. She does not complain of peripheral edema today. She continues to have chronic weakness and fatigue.  She has a fair appetite and denies weight loss. She denies any chest pain. She denies any nausea, vomiting, constipation, or diarrhea. She has no melena or hematochezia. She has no urinary complaints. Patient offers no further specific complaints.  REVIEW OF SYSTEMS:   Review of Systems  Constitutional: Positive for malaise/fatigue.  Respiratory: Positive for shortness of breath.   Cardiovascular: Negative for chest pain.  Gastrointestinal: Negative.   Neurological: Positive for weakness.    As per HPI. Otherwise, a complete review of systems is negatve.  PAST MEDICAL HISTORY: Past Medical History  Diagnosis Date  . Dementia   . A-fib   . Hypertension   . Melanoma     PAST SURGICAL HISTORY: Past Surgical History  Procedure Laterality Date  . Skin graft    . Skin surgery      FAMILY HISTORY: Reviewed and unchanged. No report of malignancy or chronic disease.     ADVANCED DIRECTIVES:    HEALTH MAINTENANCE: History  Substance Use Topics  . Smoking status: Former Smoker    Types: Cigarettes    Quit date: 08/30/1987  . Smokeless tobacco: Not on file  . Alcohol Use: Not on file     Colonoscopy:  PAP:  Bone density:  Lipid panel:  Allergies  Allergen Reactions  .  Codeine Other (See Comments)    GI distress  . Shellfish Allergy Diarrhea and Nausea And Vomiting  . Penicillins Itching and Rash    Current Outpatient Prescriptions  Medication Sig Dispense Refill  . atorvastatin (LIPITOR) 40 MG tablet Take 1 tablet (40 mg total) by mouth daily at 6 PM. 30 tablet 1  . azithromycin (ZITHROMAX) 500 MG tablet Take 500 mg by mouth daily.    . bisacodyl (DULCOLAX) 10 MG suppository Place 10 mg rectally daily as needed for moderate constipation.    . cefTRIAXone (ROCEPHIN) 1 G injection Inject 1 g into the muscle daily.    Marland Kitchen donepezil (ARICEPT) 5 MG tablet Take 5 mg by mouth at bedtime.    . Fluticasone Furoate-Vilanterol 100-25 MCG/INH AEPB Inhale 1 puff into the lungs daily.    . furosemide (LASIX) 20 MG tablet Take 20 mg by mouth 2 (two) times daily.    Marland Kitchen ipratropium-albuterol (DUONEB) 0.5-2.5 (3) MG/3ML SOLN Take 3 mLs by nebulization every 8 (eight) hours as needed.    Marland Kitchen L-Methylfolate-Algae-B12-B6 (METANX) 3-90.314-2-35 MG CAPS Take 1 tablet by mouth daily.    Marland Kitchen lamoTRIgine (LAMICTAL) 25 MG tablet Take 25 mg by mouth 2 (two) times daily.    Marland Kitchen lidocaine-prilocaine (EMLA) cream Apply to affected area once 30 g 3  . metolazone (ZAROXOLYN) 2.5 MG tablet Take 2.5 mg by mouth daily.    . metoprolol succinate (TOPROL-XL) 25 MG 24 hr tablet Take 25 mg by mouth daily.    Marland Kitchen omeprazole (PRILOSEC)  20 MG capsule Take 20 mg by mouth daily.    . ondansetron (ZOFRAN-ODT) 4 MG disintegrating tablet Take 4 mg by mouth every 8 (eight) hours as needed for nausea or vomiting.    . potassium chloride SA (K-DUR,KLOR-CON) 20 MEQ tablet Take 20 mEq by mouth 2 (two) times daily.    . Rivaroxaban (XARELTO) 15 MG TABS tablet Take 15 mg by mouth daily.    Marland Kitchen senna-docusate (SENOKOT-S) 8.6-50 MG per tablet Take 2 tablets by mouth 2 (two) times daily.    . sertraline (ZOLOFT) 50 MG tablet Take 50 mg by mouth at bedtime.    Marland Kitchen tiotropium (SPIRIVA) 18 MCG inhalation capsule Place 18 mcg  into inhaler and inhale daily.    . traMADol (ULTRAM) 50 MG tablet Take 1 tablet (50 mg total) by mouth every 6 (six) hours as needed. For moderate pain 30 tablet 0  . benzonatate (TESSALON) 100 MG capsule Take 100 mg by mouth 3 (three) times daily as needed for cough.    . carvedilol (COREG) 6.25 MG tablet Take 6.25 mg by mouth 2 (two) times daily with a meal.    . ciprofloxacin (CIPRO) 250 MG tablet Take 1 tablet (250 mg total) by mouth 2 (two) times daily. (Patient not taking: Reported on 01/07/2015) 13 tablet 0  . Fluticasone-Salmeterol (ADVAIR) 100-50 MCG/DOSE AEPB Inhale 1 puff into the lungs every 12 (twelve) hours.    Marland Kitchen guaiFENesin (MUCINEX) 600 MG 12 hr tablet Take 1 tablet (600 mg total) by mouth 2 (two) times daily. (Patient not taking: Reported on 01/07/2015)    . HYDROcodone-acetaminophen (NORCO/VICODIN) 5-325 MG per tablet Take 1 tablet by mouth every 6 (six) hours as needed. For severe pain (Patient not taking: Reported on 01/07/2015) 30 tablet 0  . lisinopril (PRINIVIL,ZESTRIL) 2.5 MG tablet Take 1.25 mg by mouth daily.    Marland Kitchen nystatin (MYCOSTATIN/NYSTOP) 100000 UNIT/GM POWD Apply 1 g topically 3 (three) times daily.    . pantoprazole (PROTONIX) 40 MG injection Inject 40 mg into the vein as needed (for GERD).     No current facility-administered medications for this visit.   Facility-Administered Medications Ordered in Other Visits  Medication Dose Route Frequency Provider Last Rate Last Dose  . heparin lock flush 100 unit/mL  500 Units Intravenous Once Lloyd Huger, MD      . sodium chloride 0.9 % injection 10 mL  10 mL Intravenous PRN Lloyd Huger, MD        OBJECTIVE: Filed Vitals:   01/07/15 1015  BP: 114/60  Pulse: 91  Temp: 98.2 F (36.8 C)  Resp: 18     Body mass index is 30.87 kg/(m^2).    ECOG FS:2 - Symptomatic, <50% confined to bed  General: Well-developed, well-nourished, no acute distress. Eyes: anicteric sclera. Lungs: Clear to auscultation  bilaterally. Heart: Regular rate and rhythm. No rubs, murmurs, or gallops. Abdomen: Soft, nontender, nondistended. No organomegaly noted, normoactive bowel sounds. Musculoskeletal: No edema, cyanosis, or clubbing. Neuro: Alert, answering all questions appropriately. Cranial nerves grossly intact. Skin: No rashes or petechiae noted. Psych: Normal affect.    LAB RESULTS:  Lab Results  Component Value Date   NA 136 01/07/2015   K 4.7 01/07/2015   CL 103 01/07/2015   CO2 29 01/07/2015   GLUCOSE 124* 01/07/2015   BUN 11 01/07/2015   CREATININE 0.45 01/07/2015   CALCIUM 7.8* 01/07/2015   PROT 6.0* 01/07/2015   ALBUMIN 2.4* 01/07/2015   AST 23 01/07/2015   ALT  13* 01/07/2015   ALKPHOS 62 01/07/2015   BILITOT 0.5 01/07/2015   GFRNONAA >60 01/07/2015   GFRAA >60 01/07/2015    Lab Results  Component Value Date   WBC 6.1 01/07/2015   NEUTROABS 5.0 01/07/2015   HGB 9.6* 01/07/2015   HCT 29.7* 01/07/2015   MCV 89.6 01/07/2015   PLT 274 01/07/2015     STUDIES: Dg Chest 2 View  12/17/2014   CLINICAL DATA:  81 year old female status post fall with confusion and weakness after being off her oxygen today. Clinical history includes malignant melanoma.  EXAM: CHEST  2 VIEW  COMPARISON:  Prior PET-CT 11/04/2014; prior chest x-ray 05/15/2014  FINDINGS: Stable cardiomegaly. Atherosclerotic calcifications present in the transverse aorta. Stable chronic bronchiectasis and volume loss in the medial aspect of the right upper lobe. Mild vascular congestion, central bronchitic change in diffuse interstitial prominence are also similar compared to prior. No new focal airspace consolidation, pleural effusion or pneumothorax. Stable pulmonary hyperinflation. Bilateral shoulder joint osteoarthritis. No acute osseous abnormality.  IMPRESSION: Stable chest x-ray without evidence of acute cardiopulmonary process.   Electronically Signed   By: Jacqulynn Cadet M.D.   On: 12/17/2014 19:58   Ct Angio Chest  Pe W/cm &/or Wo Cm  12/18/2014   CLINICAL DATA:  Cough and congestion.  EXAM: CT ANGIOGRAPHY CHEST WITH CONTRAST  TECHNIQUE: Multidetector CT imaging of the chest was performed using the standard protocol during bolus administration of intravenous contrast. Multiplanar CT image reconstructions and MIPs were obtained to evaluate the vascular anatomy.  CONTRAST:  86 cc Omnipaque 350 intravenous  COMPARISON:  05/06/2014  FINDINGS: THORACIC INLET/BODY WALL:  No acute abnormality.  MEDIASTINUM:  Chronic cardiomegaly. No pericardial effusion. No evidence of aortic dissection or pulmonary embolism. There is mild subendocardial low-attenuation at the left ventricular apex suggesting remote subendocardial infarction. Stable enlargement of a lower right periesophageal lymph node measuring 11 mm short axis. Stable mild enlargement of sub- carina lymph nodes. No further enlargement of the right peritracheal lymph node highlighted on PET-CT last month.  LUNG WINDOWS:  Spiculated opacity in the perihilar right upper lobe is grossly stable from previous, mainly related to fibrosis. Volume loss and opacity in the right upper lobe is chronic and no superimposed pneumonia is suspected. There is diffuse bronchial wall thickening which is increased from previous imaging. No edema, effusion, or pneumothorax.  UPPER ABDOMEN:  Stable low densities in the liver, 14 mm in the subcapsular central liver and 18 mm in the posterior segment right liver.  OSSEOUS:  No acute fracture.  No suspicious lytic or blastic lesions.  Review of the MIP images confirms the above findings.  IMPRESSION: 1. No evidence of pulmonary embolism. 2. Bronchitis which is new/increased from 11/04/2014 and likely infectious. 3. Treated right upper lobe squamous cell carcinoma. No developments since staging PET-CT 11/04/2014.   Electronically Signed   By: Monte Fantasia M.D.   On: 12/18/2014 01:25   Ct Head Limited W/o Cm  12/18/2014   CLINICAL DATA:  Dementia,  fall with occipital trauma.  EXAM: CT HEAD WITHOUT CONTRAST  TECHNIQUE: Contiguous axial images were obtained from the base of the skull through the vertex without intravenous contrast.  COMPARISON:  03/22/2014  FINDINGS: Prominence of the sulci, cisterns, ventricles, in keeping with advanced cerebral and cerebellar volume loss and similar to prior. Subcortical and periventricular white matter hypodensities are similar to prior, a nonspecific finding often seen in the setting of chronic microangiopathic change. Left frontal lobe encephalomalacia may reflect  sequelae of prior infarction. No definite CT evidence of an acute infarction. No intraparenchymal hemorrhage, mass, mass effect, or abnormal extra-axial fluid collection. Atherosclerotic vascular calcifications. The visualized paranasal sinuses and mastoid air cells are predominantly clear. No displaced calvarial fracture.  IMPRESSION: Volume loss and white matter changes, similar to prior. No definite CT evidence of an acute intracranial abnormality.   Electronically Signed   By: Carlos Levering M.D.   On: 12/18/2014 01:09    ASSESSMENT:   1.  Recurrent stage IV melanoma.   2.  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 3.  Iron deficiency anemia.  PLAN:    1. Melanoma: Recurrent disease confirmed by biopsy.  CT results reviewed independently and reported as above. Proceed with cycle 3 of 4 ipilumimab '3mg'$ /kg and nivolumab '2mg'$ /kg today. Patient will receive this treatment every 3 weeks for 4 cycles followed by maintenance nivolumab every 2 weeks. Plan to reimage after conclusion of cycle 4.  Patient and her family expressed understanding and was in agreement with this plan. 2. Iron deficiency anemia: Hemoglobin and iron stores are within normal limits. She last received IV iron in June of 2015. No intervention is needed at this time. 3.  Squamous cell carcinoma:  Unknown primary.  PET lesions are likely from  melanoma. 4.  Seizures: MRI brain did not reveal metastatic disease 5.  Peripheral edema: Multifactorial, secondary to hypoalbuminenia and recurrent melanoma. 6.  Shortness of breath:  Chronic, secondary to COPD. 7.  Weakness/Fatigued/Decreased performance status:  Improved, monitor.    Patient expressed understanding and was in agreement with this plan. She also understands that She can call clinic at any time with any questions, concerns, or complaints.   Melanoma left shin--s/p excision with skin graft   Staging form: Melanoma of the Skin, AJCC 7th Edition     Clinical stage from 01/04/2015: Stage IV (TX, N2, M1a) - Signed by Lloyd Huger, MD on 01/04/2015   Lloyd Huger, MD   01/07/2015 2:15 PM

## 2015-01-27 ENCOUNTER — Other Ambulatory Visit: Payer: Self-pay | Admitting: Oncology

## 2015-01-28 ENCOUNTER — Inpatient Hospital Stay (HOSPITAL_BASED_OUTPATIENT_CLINIC_OR_DEPARTMENT_OTHER): Payer: Medicare Other | Admitting: Oncology

## 2015-01-28 ENCOUNTER — Inpatient Hospital Stay: Payer: Medicare Other

## 2015-01-28 ENCOUNTER — Inpatient Hospital Stay: Payer: Medicare Other | Attending: Oncology

## 2015-01-28 VITALS — BP 111/72 | HR 67 | Temp 97.0°F | Resp 20 | Wt 166.9 lb

## 2015-01-28 DIAGNOSIS — R5383 Other fatigue: Secondary | ICD-10-CM | POA: Diagnosis not present

## 2015-01-28 DIAGNOSIS — R0602 Shortness of breath: Secondary | ICD-10-CM | POA: Diagnosis not present

## 2015-01-28 DIAGNOSIS — R609 Edema, unspecified: Secondary | ICD-10-CM | POA: Insufficient documentation

## 2015-01-28 DIAGNOSIS — J449 Chronic obstructive pulmonary disease, unspecified: Secondary | ICD-10-CM | POA: Insufficient documentation

## 2015-01-28 DIAGNOSIS — I10 Essential (primary) hypertension: Secondary | ICD-10-CM

## 2015-01-28 DIAGNOSIS — Z79899 Other long term (current) drug therapy: Secondary | ICD-10-CM | POA: Insufficient documentation

## 2015-01-28 DIAGNOSIS — D509 Iron deficiency anemia, unspecified: Secondary | ICD-10-CM

## 2015-01-28 DIAGNOSIS — L97929 Non-pressure chronic ulcer of unspecified part of left lower leg with unspecified severity: Secondary | ICD-10-CM | POA: Diagnosis not present

## 2015-01-28 DIAGNOSIS — C439 Malignant melanoma of skin, unspecified: Secondary | ICD-10-CM

## 2015-01-28 DIAGNOSIS — I4891 Unspecified atrial fibrillation: Secondary | ICD-10-CM

## 2015-01-28 DIAGNOSIS — R599 Enlarged lymph nodes, unspecified: Secondary | ICD-10-CM | POA: Insufficient documentation

## 2015-01-28 DIAGNOSIS — C801 Malignant (primary) neoplasm, unspecified: Secondary | ICD-10-CM

## 2015-01-28 DIAGNOSIS — F0390 Unspecified dementia without behavioral disturbance: Secondary | ICD-10-CM | POA: Diagnosis not present

## 2015-01-28 DIAGNOSIS — R531 Weakness: Secondary | ICD-10-CM | POA: Insufficient documentation

## 2015-01-28 DIAGNOSIS — Z87891 Personal history of nicotine dependence: Secondary | ICD-10-CM | POA: Diagnosis not present

## 2015-01-28 DIAGNOSIS — Z8669 Personal history of other diseases of the nervous system and sense organs: Secondary | ICD-10-CM | POA: Diagnosis not present

## 2015-01-28 DIAGNOSIS — J90 Pleural effusion, not elsewhere classified: Secondary | ICD-10-CM | POA: Insufficient documentation

## 2015-01-28 DIAGNOSIS — Z5111 Encounter for antineoplastic chemotherapy: Secondary | ICD-10-CM | POA: Insufficient documentation

## 2015-01-28 LAB — CBC WITH DIFFERENTIAL/PLATELET
Basophils Absolute: 0 10*3/uL (ref 0–0.1)
Basophils Relative: 0 %
EOS ABS: 0.3 10*3/uL (ref 0–0.7)
Eosinophils Relative: 3 %
HCT: 30.7 % — ABNORMAL LOW (ref 35.0–47.0)
Hemoglobin: 9.6 g/dL — ABNORMAL LOW (ref 12.0–16.0)
LYMPHS PCT: 5 %
Lymphs Abs: 0.4 10*3/uL — ABNORMAL LOW (ref 1.0–3.6)
MCH: 27.9 pg (ref 26.0–34.0)
MCHC: 31.2 g/dL — ABNORMAL LOW (ref 32.0–36.0)
MCV: 89.3 fL (ref 80.0–100.0)
MONO ABS: 0.5 10*3/uL (ref 0.2–0.9)
Monocytes Relative: 6 %
NEUTROS PCT: 86 %
Neutro Abs: 7.1 10*3/uL — ABNORMAL HIGH (ref 1.4–6.5)
Platelets: 258 10*3/uL (ref 150–440)
RBC: 3.44 MIL/uL — AB (ref 3.80–5.20)
RDW: 18.2 % — AB (ref 11.5–14.5)
WBC: 8.4 10*3/uL (ref 3.6–11.0)

## 2015-01-28 LAB — COMPREHENSIVE METABOLIC PANEL
ALK PHOS: 66 U/L (ref 38–126)
ALT: 12 U/L — ABNORMAL LOW (ref 14–54)
ANION GAP: 5 (ref 5–15)
AST: 27 U/L (ref 15–41)
Albumin: 2 g/dL — ABNORMAL LOW (ref 3.5–5.0)
BUN: 16 mg/dL (ref 6–20)
CO2: 29 mmol/L (ref 22–32)
Calcium: 7.8 mg/dL — ABNORMAL LOW (ref 8.9–10.3)
Chloride: 103 mmol/L (ref 101–111)
Creatinine, Ser: 0.53 mg/dL (ref 0.44–1.00)
GFR calc Af Amer: >60 mL/min (ref >60)
GFR calc non Af Amer: >60 mL/min (ref >60)
Glucose, Bld: 168 mg/dL — ABNORMAL HIGH (ref 65–99)
Potassium: 4.5 mmol/L (ref 3.5–5.1)
Sodium: 137 mmol/L (ref 135–145)
Total Bilirubin: 0.5 mg/dL (ref 0.3–1.2)
Total Protein: 5.6 g/dL — ABNORMAL LOW (ref 6.5–8.1)

## 2015-01-28 MED ORDER — SODIUM CHLORIDE 0.9 % IV SOLN
Freq: Once | INTRAVENOUS | Status: AC
  Administered 2015-01-28: 11:00:00 via INTRAVENOUS
  Filled 2015-01-28: qty 1000

## 2015-01-28 MED ORDER — SODIUM CHLORIDE 0.9 % IV SOLN
3.0000 mg/kg | Freq: Once | INTRAVENOUS | Status: AC
  Administered 2015-01-28: 245 mg via INTRAVENOUS
  Filled 2015-01-28: qty 9

## 2015-01-28 MED ORDER — SODIUM CHLORIDE 0.9 % IV SOLN
1.0000 mg/kg | Freq: Once | INTRAVENOUS | Status: AC
  Administered 2015-01-28: 80 mg via INTRAVENOUS
  Filled 2015-01-28: qty 8

## 2015-02-09 NOTE — Progress Notes (Signed)
[place]  [phone]) [phone] Fax:(336) [phone]  ID: Melanie Wood OB: [birth year]  MR#: [id number]  JAS#:[id number]  Patient Care Team: Adrian Prows, MD as PCP - General (Infectious Diseases)  CHIEF COMPLAINT:  Chief Complaint  Patient presents with  . Follow-up    Melanoma    INTERVAL HISTORY: Patient returns to clinic today for further evaluation and consideration of cycle 4 of ipilumimab and nivolumab. She continues to have chronic shortness of breath, but this is improved. She does not complain of peripheral edema today. She continues to have chronic weakness and fatigue.  She has a fair appetite and denies weight loss. She denies any chest pain. She denies any nausea, vomiting, constipation, or diarrhea. She has no melena or hematochezia. She has no urinary complaints. Patient offers no further specific complaints.  REVIEW OF SYSTEMS:   Review of Systems  Constitutional: Positive for malaise/fatigue.  Respiratory: Positive for shortness of breath.   Cardiovascular: Negative for chest pain.  Gastrointestinal: Negative.   Neurological: Positive for weakness.    As per HPI. Otherwise, a complete review of systems is negatve.  PAST MEDICAL HISTORY: Past Medical History  Diagnosis Date  . Dementia   . A-fib   . Hypertension   . Melanoma     PAST SURGICAL HISTORY: Past Surgical History  Procedure Laterality Date  . Skin graft    . Skin surgery      FAMILY HISTORY: Reviewed and unchanged. No report of malignancy or chronic disease.     ADVANCED DIRECTIVES:    HEALTH MAINTENANCE: History  Substance Use Topics  . Smoking status: Former Smoker    Types: Cigarettes    Quit date: 08/30/1987  . Smokeless tobacco: Not on file  . Alcohol Use: Not on file     Colonoscopy:  PAP:  Bone density:  Lipid panel:  Allergies  Allergen Reactions  . Codeine Other (See Comments)    GI distress  . Shellfish Allergy Diarrhea and Nausea  And Vomiting  . Penicillins Itching and Rash    Current Outpatient Prescriptions  Medication Sig Dispense Refill  . albuterol (PROVENTIL) (2.5 MG/3ML) 0.083% nebulizer solution   10  . atorvastatin (LIPITOR) 40 MG tablet Take 1 tablet (40 mg total) by mouth daily at 6 PM. 30 tablet 1  . benzonatate (TESSALON) 100 MG capsule Take 100 mg by mouth 3 (three) times daily as needed for cough.    . bisacodyl (DULCOLAX) 10 MG suppository Place 10 mg rectally daily as needed for moderate constipation.    . carvedilol (COREG) 6.25 MG tablet Take 6.25 mg by mouth 2 (two) times daily with a meal.    . donepezil (ARICEPT) 5 MG tablet Take 5 mg by mouth at bedtime.    . Fluticasone Furoate-Vilanterol 100-25 MCG/INH AEPB Inhale 1 puff into the lungs daily.    . Fluticasone-Salmeterol (ADVAIR) 100-50 MCG/DOSE AEPB Inhale 1 puff into the lungs every 12 (twelve) hours.    . furosemide (LASIX) 20 MG tablet Take 20 mg by mouth 2 (two) times daily.    Marland Kitchen guaiFENesin (MUCINEX) 600 MG 12 hr tablet Take 1 tablet (600 mg total) by mouth 2 (two) times daily.    Marland Kitchen HYDROcodone-acetaminophen (NORCO/VICODIN) 5-325 MG per tablet Take 1 tablet by mouth every 6 (six) hours as needed. For severe pain 30 tablet 0  . ipratropium-albuterol (DUONEB) 0.5-2.5 (3) MG/3ML SOLN Take 3 mLs by nebulization every 8 (eight) hours as needed.    Marland Kitchen L-Methylfolate-Algae-B12-B6 (METANX) 3-90.314-2-35 MG  CAPS Take 1 tablet by mouth daily.    Marland Kitchen lamoTRIgine (LAMICTAL) 25 MG tablet Take 25 mg by mouth 2 (two) times daily.    Marland Kitchen lidocaine-prilocaine (EMLA) cream Apply to affected area once 30 g 3  . lisinopril (PRINIVIL,ZESTRIL) 2.5 MG tablet Take 1.25 mg by mouth daily.    . metolazone (ZAROXOLYN) 2.5 MG tablet Take 2.5 mg by mouth daily.    . metoprolol succinate (TOPROL-XL) 25 MG 24 hr tablet Take 25 mg by mouth daily.    Marland Kitchen nystatin (MYCOSTATIN/NYSTOP) 100000 UNIT/GM POWD Apply 1 g topically 3 (three) times daily.    Marland Kitchen omeprazole (PRILOSEC) 20 MG  capsule Take 20 mg by mouth daily.    . ondansetron (ZOFRAN-ODT) 4 MG disintegrating tablet Take 4 mg by mouth every 8 (eight) hours as needed for nausea or vomiting.    . pantoprazole (PROTONIX) 40 MG injection Inject 40 mg into the vein as needed (for GERD).    Marland Kitchen potassium chloride SA (K-DUR,KLOR-CON) 20 MEQ tablet Take 20 mEq by mouth 2 (two) times daily.    . Rivaroxaban (XARELTO) 15 MG TABS tablet Take 15 mg by mouth daily.    Marland Kitchen senna-docusate (SENOKOT-S) 8.6-50 MG per tablet Take 2 tablets by mouth 2 (two) times daily.    . sertraline (ZOLOFT) 50 MG tablet Take 50 mg by mouth at bedtime.    Marland Kitchen tiotropium (SPIRIVA) 18 MCG inhalation capsule Place 18 mcg into inhaler and inhale daily.    . traMADol (ULTRAM) 50 MG tablet Take 1 tablet (50 mg total) by mouth every 6 (six) hours as needed. For moderate pain 30 tablet 0  . azithromycin (ZITHROMAX) 500 MG tablet Take 500 mg by mouth daily.    . cefTRIAXone (ROCEPHIN) 1 G injection Inject 1 g into the muscle daily.    . ciprofloxacin (CIPRO) 250 MG tablet Take 1 tablet (250 mg total) by mouth 2 (two) times daily. (Patient not taking: Reported on 01/07/2015) 13 tablet 0   No current facility-administered medications for this visit.    OBJECTIVE: Filed Vitals:   01/28/15 1036  BP: 111/72  Pulse: 67  Temp: 97 F (36.1 C)  Resp: 20     Body mass index is 30.52 kg/(m^2).    ECOG FS:2 - Symptomatic, <50% confined to bed  General: Well-developed, well-nourished, no acute distress. Eyes: anicteric sclera. Lungs: Clear to auscultation bilaterally. Heart: Regular rate and rhythm. No rubs, murmurs, or gallops. Abdomen: Soft, nontender, nondistended. No organomegaly noted, normoactive bowel sounds. Musculoskeletal: 1-2+ peripheral edema. Neuro: Alert, answering all questions appropriately. Cranial nerves grossly intact. Skin: No rashes or petechiae noted. Psych: Normal affect.    LAB RESULTS:  Lab Results  Component Value Date   NA 137  01/28/2015   K 4.5 01/28/2015   CL 103 01/28/2015   CO2 29 01/28/2015   GLUCOSE 168* 01/28/2015   BUN 16 01/28/2015   CREATININE 0.53 01/28/2015   CALCIUM 7.8* 01/28/2015   PROT 5.6* 01/28/2015   ALBUMIN 2.0* 01/28/2015   AST 27 01/28/2015   ALT 12* 01/28/2015   ALKPHOS 66 01/28/2015   BILITOT 0.5 01/28/2015   GFRNONAA >60 01/28/2015   GFRAA >60 01/28/2015    Lab Results  Component Value Date   WBC 8.4 01/28/2015   NEUTROABS 7.1* 01/28/2015   HGB 9.6* 01/28/2015   HCT 30.7* 01/28/2015   MCV 89.3 01/28/2015   PLT 258 01/28/2015     STUDIES: No results found.  ASSESSMENT:   1.  Recurrent  stage IV melanoma.   2.  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 3.  Iron deficiency anemia.  PLAN:    1. Melanoma: Recurrent disease confirmed by biopsy.  CT results reviewed independently. Proceed with cycle 4 of 4 ipilumimab '3mg'$ /kg and nivolumab '2mg'$ /kg today. Patient will receive this treatment every 3 weeks for 4 cycles followed by maintenance nivolumab every 2 weeks. Return to clinic in 3 weeks for consideration of maintenance nivolumab.  Patient and her family expressed understanding and were in agreement with this plan. 2. Iron deficiency anemia: Hemoglobin and iron stores are within normal limits. She last received IV iron in June of 2015. No intervention is needed at this time. 3.  Squamous cell carcinoma:  Unknown primary.  PET lesions are likely from melanoma. 4.  Seizures: MRI brain did not reveal metastatic disease 5.  Peripheral edema: Multifactorial, secondary to hypoalbuminenia and recurrent melanoma. 6.  Shortness of breath:  Chronic, secondary to COPD. 7.  Weakness/Fatigued/Decreased performance status:  Improved, monitor.    Melanoma left shin--s/p excision with skin graft   Staging form: Melanoma of the Skin, AJCC 7th Edition     Clinical stage from 01/04/2015: Stage IV (TX, N2, M1a) - Signed by Lloyd Huger, MD on  01/04/2015   Lloyd Huger, MD   02/09/2015 5:56 PM

## 2015-02-10 ENCOUNTER — Telehealth: Payer: Self-pay | Admitting: *Deleted

## 2015-02-10 ENCOUNTER — Other Ambulatory Visit: Payer: Self-pay | Admitting: Oncology

## 2015-02-10 NOTE — Telephone Encounter (Signed)
Keep drsg on it per Dr Grayland Ormond

## 2015-02-11 ENCOUNTER — Telehealth: Payer: Self-pay | Admitting: Surgery

## 2015-02-11 NOTE — Telephone Encounter (Signed)
Returned patient call. Talked with patient's daughter and she reported that the patient has cancer lesions that bleed severely between dressing changes and only direct pressure stop bleeding. The daughter wanted to know if there are any options that would help to stop bleeding at dressing changes. Suggested to try to use a non stick guaze such as Telfa to reduce the chance of removing the old scab and subsequent bleeding. Vaseline guaze such as zeroform was also suggested. The patient is presently being treated by the cancer center and I informed the daughter that she should direct future questions to them. Patient's daughter confirmed understanding of information.

## 2015-02-11 NOTE — Telephone Encounter (Signed)
Helene Kelp, daughter, called and said that Ms. Pledger has lesions on her legs that the dr. Is aware of and they are bleeding. What can they put on the areas? Please call

## 2015-02-13 ENCOUNTER — Other Ambulatory Visit: Payer: Self-pay

## 2015-02-16 ENCOUNTER — Ambulatory Visit
Admission: RE | Admit: 2015-02-16 | Discharge: 2015-02-16 | Disposition: A | Discharge status: Home / Self Care | Payer: Medicare Other | Source: Ambulatory Visit | Attending: Oncology | Admitting: Oncology

## 2015-02-16 ENCOUNTER — Other Ambulatory Visit: Payer: Self-pay | Admitting: Oncology

## 2015-02-16 DIAGNOSIS — C439 Malignant melanoma of skin, unspecified: Secondary | ICD-10-CM | POA: Diagnosis present

## 2015-02-16 LAB — GLUCOSE, CAPILLARY: GLUCOSE-CAPILLARY: 102 mg/dL — AB (ref 65–99)

## 2015-02-16 MED ORDER — FLUDEOXYGLUCOSE F - 18 (FDG) INJECTION
12.3000 | Freq: Once | INTRAVENOUS | Status: AC | PRN
  Administered 2015-02-16: 12.3 via INTRAVENOUS

## 2015-02-17 ENCOUNTER — Ambulatory Visit: Payer: Self-pay | Admitting: Family Medicine

## 2015-02-18 ENCOUNTER — Encounter: Payer: Self-pay | Admitting: Family Medicine

## 2015-02-18 ENCOUNTER — Inpatient Hospital Stay: Payer: Medicare Other

## 2015-02-18 ENCOUNTER — Inpatient Hospital Stay (HOSPITAL_BASED_OUTPATIENT_CLINIC_OR_DEPARTMENT_OTHER): Payer: Medicare Other | Admitting: Family Medicine

## 2015-02-18 VITALS — BP 118/67 | HR 75 | Wt 174.2 lb

## 2015-02-18 DIAGNOSIS — I10 Essential (primary) hypertension: Secondary | ICD-10-CM

## 2015-02-18 DIAGNOSIS — J449 Chronic obstructive pulmonary disease, unspecified: Secondary | ICD-10-CM

## 2015-02-18 DIAGNOSIS — C801 Malignant (primary) neoplasm, unspecified: Secondary | ICD-10-CM | POA: Diagnosis not present

## 2015-02-18 DIAGNOSIS — J90 Pleural effusion, not elsewhere classified: Secondary | ICD-10-CM

## 2015-02-18 DIAGNOSIS — D509 Iron deficiency anemia, unspecified: Secondary | ICD-10-CM | POA: Diagnosis not present

## 2015-02-18 DIAGNOSIS — R599 Enlarged lymph nodes, unspecified: Secondary | ICD-10-CM

## 2015-02-18 DIAGNOSIS — I4891 Unspecified atrial fibrillation: Secondary | ICD-10-CM

## 2015-02-18 DIAGNOSIS — C439 Malignant melanoma of skin, unspecified: Secondary | ICD-10-CM

## 2015-02-18 DIAGNOSIS — R5383 Other fatigue: Secondary | ICD-10-CM

## 2015-02-18 DIAGNOSIS — R0602 Shortness of breath: Secondary | ICD-10-CM

## 2015-02-18 DIAGNOSIS — L97929 Non-pressure chronic ulcer of unspecified part of left lower leg with unspecified severity: Secondary | ICD-10-CM | POA: Diagnosis not present

## 2015-02-18 DIAGNOSIS — Z79899 Other long term (current) drug therapy: Secondary | ICD-10-CM

## 2015-02-18 DIAGNOSIS — R609 Edema, unspecified: Secondary | ICD-10-CM

## 2015-02-18 DIAGNOSIS — F0390 Unspecified dementia without behavioral disturbance: Secondary | ICD-10-CM

## 2015-02-18 DIAGNOSIS — R531 Weakness: Secondary | ICD-10-CM

## 2015-02-18 DIAGNOSIS — Z87891 Personal history of nicotine dependence: Secondary | ICD-10-CM

## 2015-02-18 DIAGNOSIS — Z8669 Personal history of other diseases of the nervous system and sense organs: Secondary | ICD-10-CM

## 2015-02-18 LAB — CBC WITH DIFFERENTIAL/PLATELET
BASOS ABS: 0.1 10*3/uL (ref 0–0.1)
BASOS PCT: 1 %
EOS PCT: 6 %
Eosinophils Absolute: 0.5 10*3/uL (ref 0–0.7)
HEMATOCRIT: 29.4 % — AB (ref 35.0–47.0)
Hemoglobin: 9.3 g/dL — ABNORMAL LOW (ref 12.0–16.0)
Lymphocytes Relative: 8 %
Lymphs Abs: 0.8 10*3/uL — ABNORMAL LOW (ref 1.0–3.6)
MCH: 27 pg (ref 26.0–34.0)
MCHC: 31.6 g/dL — AB (ref 32.0–36.0)
MCV: 85.5 fL (ref 80.0–100.0)
MONO ABS: 0.6 10*3/uL (ref 0.2–0.9)
Monocytes Relative: 6 %
Neutro Abs: 7.3 10*3/uL — ABNORMAL HIGH (ref 1.4–6.5)
Neutrophils Relative %: 79 %
Platelets: 262 10*3/uL (ref 150–440)
RBC: 3.43 MIL/uL — ABNORMAL LOW (ref 3.80–5.20)
RDW: 18 % — ABNORMAL HIGH (ref 11.5–14.5)
WBC: 9.2 10*3/uL (ref 3.6–11.0)

## 2015-02-18 LAB — COMPREHENSIVE METABOLIC PANEL
ALT: 10 U/L — ABNORMAL LOW (ref 14–54)
ANION GAP: 7 (ref 5–15)
AST: 24 U/L (ref 15–41)
Albumin: 1.8 g/dL — ABNORMAL LOW (ref 3.5–5.0)
Alkaline Phosphatase: 76 U/L (ref 38–126)
BILIRUBIN TOTAL: 0.4 mg/dL (ref 0.3–1.2)
BUN: 17 mg/dL (ref 6–20)
CO2: 31 mmol/L (ref 22–32)
CREATININE: 0.59 mg/dL (ref 0.44–1.00)
Calcium: 7.6 mg/dL — ABNORMAL LOW (ref 8.9–10.3)
Chloride: 98 mmol/L — ABNORMAL LOW (ref 101–111)
Glucose, Bld: 131 mg/dL — ABNORMAL HIGH (ref 65–99)
Potassium: 4.7 mmol/L (ref 3.5–5.1)
Sodium: 136 mmol/L (ref 135–145)
Total Protein: 5.4 g/dL — ABNORMAL LOW (ref 6.5–8.1)

## 2015-02-18 NOTE — Progress Notes (Signed)
[place]  Telephone:(336) [phone]  Fax:(336) [phone]     Melanie Wood DOB: [birth year]  MR#: [id number]  ZTI#:[id number]  Patient Care Team: Adrian Prows, MD as PCP - General (Infectious Diseases)  CHIEF COMPLAINT:  Chief Complaint  Patient presents with  . Follow-up   Melanoma  INTERVAL HISTORY:  Patient returns to clinic today for further evaluation and consideration of Nivolumab maintenance therapy. She continues to have chronic shortness of breath and is currently on continuous oxygen therapy. She is having worsening lower extremity edema especially in the left lower extremity. Also having increased bleeding from the ulceration of the left lower extremity. She continues with weakness and fatigue. She had a PET scan of the whole-body performed on Monday, June 20 and is for results.  REVIEW OF SYSTEMS:   Review of Systems  Constitutional: Positive for malaise/fatigue.  Respiratory: Negative for cough, shortness of breath and wheezing.   Cardiovascular: Positive for leg swelling. Negative for chest pain and palpitations.  Gastrointestinal: Negative for nausea, vomiting and constipation.  Musculoskeletal: Negative for falls.  Skin:       Bleeding ulcerated area to left lower extremity, currently bandaged.  Neurological: Positive for weakness.  Endo/Heme/Allergies: Negative.     As per HPI. Otherwise, a complete review of systems is negatve.  ONCOLOGY HISTORY:  No history exists.    PAST MEDICAL HISTORY: Past Medical History  Diagnosis Date  . Dementia   . A-fib   . Hypertension   . Melanoma     PAST SURGICAL HISTORY: Past Surgical History  Procedure Laterality Date  . Skin graft    . Skin surgery      FAMILY HISTORY History reviewed. No pertinent family history.  GYNECOLOGIC HISTORY:  No LMP recorded. Patient is postmenopausal.     ADVANCED DIRECTIVES:    HEALTH MAINTENANCE: History  Substance Use Topics  . Smoking status:  Former Smoker    Types: Cigarettes    Quit date: 08/30/1987  . Smokeless tobacco: Not on file  . Alcohol Use: Not on file     Colonoscopy:  PAP:  Bone density:  Lipid panel:  Allergies  Allergen Reactions  . Codeine Other (See Comments)    GI distress  . Shellfish Allergy Diarrhea and Nausea And Vomiting  . Penicillins Itching and Rash    Current Outpatient Prescriptions  Medication Sig Dispense Refill  . doxycycline (VIBRAMYCIN) 100 MG capsule Take by mouth.    . lamoTRIgine (LAMICTAL) 25 MG tablet Take by mouth.    . metolazone (ZAROXOLYN) 2.5 MG tablet To be given when wt 172 lbs or greater    . silver sulfADIAZINE (SILVADENE) 1 % cream Apply topically.    Marland Kitchen albuterol (PROVENTIL) (2.5 MG/3ML) 0.083% nebulizer solution   10  . atorvastatin (LIPITOR) 40 MG tablet Take 1 tablet (40 mg total) by mouth daily at 6 PM. 30 tablet 1  . azithromycin (ZITHROMAX) 500 MG tablet Take 500 mg by mouth daily.    . benzonatate (TESSALON) 100 MG capsule Take 100 mg by mouth 3 (three) times daily as needed for cough.    . bisacodyl (DULCOLAX) 10 MG suppository Place 10 mg rectally daily as needed for moderate constipation.    . carvedilol (COREG) 6.25 MG tablet Take 6.25 mg by mouth 2 (two) times daily with a meal.    . cefTRIAXone (ROCEPHIN) 1 G injection Inject 1 g into the muscle daily.    . ciprofloxacin (CIPRO) 250 MG tablet Take 1 tablet (250 mg  total) by mouth 2 (two) times daily. (Patient not taking: Reported on 01/07/2015) 13 tablet 0  . donepezil (ARICEPT) 5 MG tablet Take 5 mg by mouth at bedtime.    . Fluticasone Furoate-Vilanterol 100-25 MCG/INH AEPB Inhale 1 puff into the lungs daily.    . Fluticasone-Salmeterol (ADVAIR) 100-50 MCG/DOSE AEPB Inhale 1 puff into the lungs every 12 (twelve) hours.    . furosemide (LASIX) 20 MG tablet Take 20 mg by mouth 2 (two) times daily.    Marland Kitchen guaiFENesin (MUCINEX) 600 MG 12 hr tablet Take 1 tablet (600 mg total) by mouth 2 (two) times daily.    Marland Kitchen  HYDROcodone-acetaminophen (NORCO/VICODIN) 5-325 MG per tablet Take 1 tablet by mouth every 6 (six) hours as needed. For severe pain 30 tablet 0  . ipratropium-albuterol (DUONEB) 0.5-2.5 (3) MG/3ML SOLN Take 3 mLs by nebulization every 8 (eight) hours as needed.    Marland Kitchen L-Methylfolate-Algae-B12-B6 (METANX) 3-90.314-2-35 MG CAPS Take 1 tablet by mouth daily.    Marland Kitchen lamoTRIgine (LAMICTAL) 25 MG tablet Take 25 mg by mouth 2 (two) times daily.    Marland Kitchen lidocaine-prilocaine (EMLA) cream Apply to affected area once 30 g 3  . lisinopril (PRINIVIL,ZESTRIL) 2.5 MG tablet Take 1.25 mg by mouth daily.    . metolazone (ZAROXOLYN) 2.5 MG tablet Take 2.5 mg by mouth daily.    . metoprolol succinate (TOPROL-XL) 25 MG 24 hr tablet Take 25 mg by mouth daily.    Marland Kitchen nystatin (MYCOSTATIN/NYSTOP) 100000 UNIT/GM POWD Apply 1 g topically 3 (three) times daily.    Marland Kitchen omeprazole (PRILOSEC) 20 MG capsule Take 20 mg by mouth daily.    . ondansetron (ZOFRAN-ODT) 4 MG disintegrating tablet Take 4 mg by mouth every 8 (eight) hours as needed for nausea or vomiting.    . pantoprazole (PROTONIX) 40 MG injection Inject 40 mg into the vein as needed (for GERD).    Marland Kitchen potassium chloride SA (K-DUR,KLOR-CON) 20 MEQ tablet Take 20 mEq by mouth 2 (two) times daily.    . Rivaroxaban (XARELTO) 15 MG TABS tablet Take 15 mg by mouth daily.    Marland Kitchen senna-docusate (SENOKOT-S) 8.6-50 MG per tablet Take 2 tablets by mouth 2 (two) times daily.    . sertraline (ZOLOFT) 50 MG tablet Take 50 mg by mouth at bedtime.    Marland Kitchen tiotropium (SPIRIVA) 18 MCG inhalation capsule Place 18 mcg into inhaler and inhale daily.    . traMADol (ULTRAM) 50 MG tablet Take 1 tablet (50 mg total) by mouth every 6 (six) hours as needed. For moderate pain 30 tablet 0   No current facility-administered medications for this visit.    OBJECTIVE: BP 118/67 mmHg  Pulse 75  Wt 174 lb 2.6 oz (79 kg)   Body mass index is 31.85 kg/(m^2).    ECOG FS:2 - Symptomatic, <50% confined to  bed  General: Well-developed, well-nourished, no acute distress. In wheelchair. Eyes: Pink conjunctiva, anicteric sclera. HEENT: Normocephalic, moist mucous membranes, clear oropharnyx. Lungs: Clear to auscultation bilaterally, diminished at bases.Marland Kitchen Heart: Regular rate and rhythm. No rubs, murmurs, or gallops. Abdomen: Soft, nontender, nondistended. No organomegaly noted, normoactive bowel sounds. Musculoskeletal: Left lower extremity edema. Neuro: Alert, answering all questions appropriately. Cranial nerves grossly intact. Skin: Ulceration of left lower extremity is worse per family, bleeding more. Currently bandaged and unable to visualize Psych: Normal affect.   LAB RESULTS:     Component Value Date/Time   NA 136 02/18/2015 1329   NA 134* 12/24/2014 0939   K 4.7  02/18/2015 1329   K 4.8 12/24/2014 0939   CL 98* 02/18/2015 1329   CL 94* 12/24/2014 0939   CO2 31 02/18/2015 1329   CO2 34* 12/24/2014 0939   GLUCOSE 131* 02/18/2015 1329   GLUCOSE 128* 12/24/2014 0939   BUN 17 02/18/2015 1329   BUN 32* 12/24/2014 0939   CREATININE 0.59 02/18/2015 1329   CREATININE 0.66 12/24/2014 0939   CALCIUM 7.6* 02/18/2015 1329   CALCIUM 7.9* 12/24/2014 0939   PROT 5.4* 02/18/2015 1329   PROT 5.7* 12/24/2014 0939   ALBUMIN 1.8* 02/18/2015 1329   ALBUMIN 2.4* 12/24/2014 0939   AST 24 02/18/2015 1329   AST 32 12/24/2014 0939   ALT 10* 02/18/2015 1329   ALT 28 12/24/2014 0939   ALKPHOS 76 02/18/2015 1329   ALKPHOS 68 12/24/2014 0939   BILITOT 0.4 02/18/2015 1329   GFRNONAA >60 02/18/2015 1329   GFRNONAA >60 12/24/2014 0939   GFRAA >60 02/18/2015 1329   GFRAA >60 12/24/2014 0939    No results found for: SPEP, UPEP  Lab Results  Component Value Date   WBC 9.2 02/18/2015   NEUTROABS 7.3* 02/18/2015   HGB 9.3* 02/18/2015   HCT 29.4* 02/18/2015   MCV 85.5 02/18/2015   PLT 262 02/18/2015      Chemistry      Component Value Date/Time   NA 136 02/18/2015 1329   NA 134*  12/24/2014 0939   K 4.7 02/18/2015 1329   K 4.8 12/24/2014 0939   CL 98* 02/18/2015 1329   CL 94* 12/24/2014 0939   CO2 31 02/18/2015 1329   CO2 34* 12/24/2014 0939   BUN 17 02/18/2015 1329   BUN 32* 12/24/2014 0939   CREATININE 0.59 02/18/2015 1329   CREATININE 0.66 12/24/2014 0939      Component Value Date/Time   CALCIUM 7.6* 02/18/2015 1329   CALCIUM 7.9* 12/24/2014 0939   ALKPHOS 76 02/18/2015 1329   ALKPHOS 68 12/24/2014 0939   AST 24 02/18/2015 1329   AST 32 12/24/2014 0939   ALT 10* 02/18/2015 1329   ALT 28 12/24/2014 0939   BILITOT 0.4 02/18/2015 1329       No results found for: LABCA2  No components found for: FXTKW409  No results for input(s): INR in the last 168 hours.  No results found for: COLORURINE, APPEARANCEUR, LABSPEC, PHURINE, GLUCOSEU, HGBUR, BILIRUBINUR, KETONESUR, PROTEINUR, UROBILINOGEN, NITRITE, LEUKOCYTESUR  STUDIES: Nm Pet Image Restage (ps) Whole Body  02/16/2015   CLINICAL DATA:  Subsequent treatment strategy for melanoma  EXAM: NUCLEAR MEDICINE PET WHOLE BODY  TECHNIQUE: 12.3 mCi F-18 FDG was injected intravenously. Full-ring PET imaging was performed from the vertex to the feet after the radiotracer. CT data was obtained and used for attenuation correction and anatomic localization.  FASTING BLOOD GLUCOSE:  Value:  100 into mg/dl  COMPARISON:  PET-CT 11/04/2014  FINDINGS: Head/Neck: No hypermetabolic lymph nodes in the neck.  Chest: There is an enlarging and hypermetabolic subcutaneous nodule in the upper left posterior thorax on image number 83 measuring 11.5 mm. SUV max is 3.6.  12 mm subcutaneous nodule involving the left upper arm has an SUV max of 1.4.  Right hilar adenopathy appears relatively stable with SUV max of 3.9 persistent dense right upper lobe scarring changes but no focus of hypermetabolism.  There is a moderate size right pleural effusion. Small left effusion.  Enlarging prevascular lymph node measures 25 x 13 mm and is  hypermetabolic with SUV max of 5.4.  Left para-aortic soft  tissue mass measures 18 x 13 mm and is hypermetabolic with SUV max of 4.7.  15.5 mm subcarinal node has SUV max of 5.7.  No obvious pulmonary metastatic disease.  Abdomen/Pelvis: Gastrohepatic ligament lymph node measures 20 x 13 mm on image number 171 and has SUV max of 3.9.  No findings for solid organ metastatic disease.  17 x 10 mm left inguinal lymph node is hypermetabolic with SUV max of 2.0. This was previously 3.1.  Skeleton: No focal hypermetabolic activity to suggest skeletal metastasis.  Extremities: Again demonstrated is extensive hypermetabolic disease involving the left lower extremity. There are numerous hypermetabolic lymph nodes and numerous subcutaneous nodules.  The largest cutaneous lesion just anterior to the tibia measures 36 x 18 mm and is markedly hypermetabolic with SUV max of 40. This is markedly progressive when compared to the prior study.  Extensive subcutaneous edema involving the left lower extremity.  IMPRESSION: Progressive metastatic melanoma as detailed above. There are enlarging and hypermetabolic subcutaneous nodules involving the thorax and left upper arm, enlarging and increasingly hypermetabolic mediastinal and hilar lymph nodes, new upper abdominal lymph nodes and progressive disease involving the left lower extremity.   Electronically Signed   By: Marijo Sanes M.D.   On: 02/16/2015 12:19    ASSESSMENT:  Recurrent stage IV melanoma  PLAN:   1. Recurrent stage IV melanoma. Patient has completed 4 cycles of Ipilumimab and Nivolumab PET scan results have been reviewed independently as well as with patient and family. Results of PET scan are consistent with progressive metastatic melanoma. There appeared to be enlarging and hypermetabolic subcutaneous nodules involving the thorax and left upper extremity, enlarging and increasingly hypermetabolic mediastinal and hilar lymph nodes, new upper abdominal lymph nodes  and progressive disease involving the left lower extremity. After discussion with family, we will hold treatment this week and we discussed case with Dr. Grayland Ormond upon his return from vacation. 2. IDA. Hemoglobin and iron stores are within normal limits for this patient. 3. Left lower extremity ulceration. Will consult wound care nurse same day as follow-up with Dr. Grayland Ormond, Thursday, June 30. We'll also enter life path referral for assistance with wound management. Patient and family are considering hospice care if treatment options are limited. Decision to be made after discussion with Dr. Grayland Ormond next week. She is a resident of [facility] assisted living at [place].  Patient expressed understanding and was in agreement with this plan. She also understands that She can call clinic at any time with any questions, concerns, or complaints.    Melanoma left shin--s/p excision with skin graft   Staging form: Melanoma of the Skin, AJCC 7th Edition     Clinical stage from 01/04/2015: Stage IV (TX, N2, M1a) - Signed by Lloyd Huger, MD on 01/04/2015   Evlyn Kanner, NP   02/18/2015 2:59 PM

## 2015-02-19 ENCOUNTER — Telehealth: Payer: Self-pay | Admitting: *Deleted

## 2015-02-19 NOTE — Telephone Encounter (Signed)
Tillie Rung notified

## 2015-02-20 NOTE — Telephone Encounter (Signed)
Left message for Jenny Reichmann, RN case manager for patient at [place], requested additional services for wound management.

## 2015-02-26 ENCOUNTER — Inpatient Hospital Stay (HOSPITAL_BASED_OUTPATIENT_CLINIC_OR_DEPARTMENT_OTHER): Payer: Medicare Other | Admitting: Oncology

## 2015-02-26 VITALS — BP 108/67 | HR 77 | Temp 96.9°F | Resp 16

## 2015-02-26 DIAGNOSIS — I4891 Unspecified atrial fibrillation: Secondary | ICD-10-CM

## 2015-02-26 DIAGNOSIS — Z87891 Personal history of nicotine dependence: Secondary | ICD-10-CM

## 2015-02-26 DIAGNOSIS — F0390 Unspecified dementia without behavioral disturbance: Secondary | ICD-10-CM

## 2015-02-26 DIAGNOSIS — D509 Iron deficiency anemia, unspecified: Secondary | ICD-10-CM | POA: Diagnosis not present

## 2015-02-26 DIAGNOSIS — Z79899 Other long term (current) drug therapy: Secondary | ICD-10-CM

## 2015-02-26 DIAGNOSIS — R599 Enlarged lymph nodes, unspecified: Secondary | ICD-10-CM

## 2015-02-26 DIAGNOSIS — I10 Essential (primary) hypertension: Secondary | ICD-10-CM

## 2015-02-26 DIAGNOSIS — L97929 Non-pressure chronic ulcer of unspecified part of left lower leg with unspecified severity: Secondary | ICD-10-CM | POA: Diagnosis not present

## 2015-02-26 DIAGNOSIS — C801 Malignant (primary) neoplasm, unspecified: Secondary | ICD-10-CM | POA: Diagnosis not present

## 2015-02-26 DIAGNOSIS — R5383 Other fatigue: Secondary | ICD-10-CM

## 2015-02-26 DIAGNOSIS — C439 Malignant melanoma of skin, unspecified: Secondary | ICD-10-CM | POA: Diagnosis not present

## 2015-02-26 DIAGNOSIS — J90 Pleural effusion, not elsewhere classified: Secondary | ICD-10-CM

## 2015-02-26 DIAGNOSIS — R609 Edema, unspecified: Secondary | ICD-10-CM

## 2015-02-26 DIAGNOSIS — Z8669 Personal history of other diseases of the nervous system and sense organs: Secondary | ICD-10-CM

## 2015-02-26 DIAGNOSIS — R0602 Shortness of breath: Secondary | ICD-10-CM

## 2015-02-26 DIAGNOSIS — R531 Weakness: Secondary | ICD-10-CM

## 2015-02-26 DIAGNOSIS — J449 Chronic obstructive pulmonary disease, unspecified: Secondary | ICD-10-CM

## 2015-02-26 NOTE — Progress Notes (Signed)
Patient was evaluated and wound was dressed by the wound care nurse today.

## 2015-03-09 NOTE — Progress Notes (Signed)
[place]  [phone] [phone]  ID: Carolin Coy OB: 1930-07-26  MR#: [id number]  OZD#:[id number]  Patient Care Team: Adrian Prows, MD as PCP - General (Infectious Diseases)  CHIEF COMPLAINT:  Chief Complaint  Patient presents with  . Follow-up    melanoma    INTERVAL HISTORY: Patient returns to clinic today for further evaluation and discussion of her PET scan results. She continues to have chronic shortness of breath, but this is improved. She has increased weakness and fatigue today. She has a fair appetite and denies weight loss. The lesion on her leg at the site of her original melanoma is significantly bleeding and being tended to by wound care clinic. She denies any chest pain. She denies any nausea, vomiting, constipation, or diarrhea. She has no melena or hematochezia. She has no urinary complaints. Patient offers no further specific complaints.  REVIEW OF SYSTEMS:   Review of Systems  Constitutional: Positive for malaise/fatigue.  Respiratory: Positive for shortness of breath.   Cardiovascular: Negative for chest pain.  Gastrointestinal: Negative.   Neurological: Positive for weakness.    As per HPI. Otherwise, a complete review of systems is negatve.  PAST MEDICAL HISTORY: Past Medical History  Diagnosis Date  . Dementia   . A-fib   . Hypertension   . Melanoma     PAST SURGICAL HISTORY: Past Surgical History  Procedure Laterality Date  . Skin graft    . Skin surgery      FAMILY HISTORY: Reviewed and unchanged. No report of malignancy or chronic disease.     ADVANCED DIRECTIVES:    HEALTH MAINTENANCE: History  Substance Use Topics  . Smoking status: Former Smoker    Types: Cigarettes    Quit date: 08/30/1987  . Smokeless tobacco: Not on file  . Alcohol Use: Not on file     Colonoscopy:  PAP:  Bone density:  Lipid panel:  Allergies  Allergen Reactions  . Codeine Other (See Comments)    GI  distress  . Shellfish Allergy Diarrhea and Nausea And Vomiting  . Penicillins Itching and Rash    Current Outpatient Prescriptions  Medication Sig Dispense Refill  . albuterol (PROVENTIL) (2.5 MG/3ML) 0.083% nebulizer solution   10  . atorvastatin (LIPITOR) 40 MG tablet Take 1 tablet (40 mg total) by mouth daily at 6 PM. 30 tablet 1  . benzonatate (TESSALON) 100 MG capsule Take 100 mg by mouth 3 (three) times daily as needed for cough.    . bisacodyl (DULCOLAX) 10 MG suppository Place 10 mg rectally daily as needed for moderate constipation.    . carvedilol (COREG) 6.25 MG tablet Take 6.25 mg by mouth 2 (two) times daily with a meal.    . donepezil (ARICEPT) 5 MG tablet Take 5 mg by mouth at bedtime.    . Fluticasone Furoate-Vilanterol 100-25 MCG/INH AEPB Inhale 1 puff into the lungs daily.    . Fluticasone-Salmeterol (ADVAIR) 100-50 MCG/DOSE AEPB Inhale 1 puff into the lungs every 12 (twelve) hours.    . furosemide (LASIX) 20 MG tablet Take 20 mg by mouth 2 (two) times daily.    Marland Kitchen guaiFENesin (MUCINEX) 600 MG 12 hr tablet Take 1 tablet (600 mg total) by mouth 2 (two) times daily.    Marland Kitchen HYDROcodone-acetaminophen (NORCO/VICODIN) 5-325 MG per tablet Take 1 tablet by mouth every 6 (six) hours as needed. For severe pain 30 tablet 0  . ipratropium-albuterol (DUONEB) 0.5-2.5 (3) MG/3ML SOLN Take 3 mLs by nebulization every 8 (eight)  hours as needed.    Marland Kitchen L-Methylfolate-Algae-B12-B6 (METANX) 3-90.314-2-35 MG CAPS Take 1 tablet by mouth daily.    Marland Kitchen lamoTRIgine (LAMICTAL) 25 MG tablet Take 25 mg by mouth 2 (two) times daily.    Marland Kitchen lidocaine-prilocaine (EMLA) cream Apply to affected area once 30 g 3  . lisinopril (PRINIVIL,ZESTRIL) 2.5 MG tablet Take 1.25 mg by mouth daily.    . metolazone (ZAROXOLYN) 2.5 MG tablet To be given when wt 172 lbs or greater    . metoprolol succinate (TOPROL-XL) 25 MG 24 hr tablet Take 25 mg by mouth daily.    Marland Kitchen nystatin (MYCOSTATIN/NYSTOP) 100000 UNIT/GM POWD Apply 1 g  topically 3 (three) times daily.    Marland Kitchen omeprazole (PRILOSEC) 20 MG capsule Take 20 mg by mouth daily.    . ondansetron (ZOFRAN-ODT) 4 MG disintegrating tablet Take 4 mg by mouth every 8 (eight) hours as needed for nausea or vomiting.    . pantoprazole (PROTONIX) 40 MG injection Inject 40 mg into the vein as needed (for GERD).    Marland Kitchen potassium chloride SA (K-DUR,KLOR-CON) 20 MEQ tablet Take 20 mEq by mouth 2 (two) times daily.    . Rivaroxaban (XARELTO) 15 MG TABS tablet Take 15 mg by mouth daily.    Marland Kitchen senna-docusate (SENOKOT-S) 8.6-50 MG per tablet Take 2 tablets by mouth 2 (two) times daily.    . sertraline (ZOLOFT) 50 MG tablet Take 50 mg by mouth at bedtime.    . silver sulfADIAZINE (SILVADENE) 1 % cream Apply topically.    . tiotropium (SPIRIVA) 18 MCG inhalation capsule Place 18 mcg into inhaler and inhale daily.    . traMADol (ULTRAM) 50 MG tablet Take 1 tablet (50 mg total) by mouth every 6 (six) hours as needed. For moderate pain 30 tablet 0   No current facility-administered medications for this visit.    OBJECTIVE: Filed Vitals:   02/26/15 1202  BP: 108/67  Pulse: 77  Temp: 96.9 F (36.1 C)  Resp: 16     There is no weight on file to calculate BMI.    ECOG FS:2 - Symptomatic, <50% confined to bed  General: Well-developed, well-nourished, no acute distress. Eyes: anicteric sclera. Lungs: Clear to auscultation bilaterally. Heart: Regular rate and rhythm. No rubs, murmurs, or gallops. Abdomen: Soft, nontender, nondistended. No organomegaly noted, normoactive bowel sounds. Musculoskeletal: 1-2+ peripheral edema. Neuro: Alert, answering all questions appropriately. Cranial nerves grossly intact. Skin: No rashes or petechiae noted. Psych: Normal affect.    LAB RESULTS:  Lab Results  Component Value Date   NA 136 02/18/2015   K 4.7 02/18/2015   CL 98* 02/18/2015   CO2 31 02/18/2015   GLUCOSE 131* 02/18/2015   BUN 17 02/18/2015   CREATININE 0.59 02/18/2015   CALCIUM  7.6* 02/18/2015   PROT 5.4* 02/18/2015   ALBUMIN 1.8* 02/18/2015   AST 24 02/18/2015   ALT 10* 02/18/2015   ALKPHOS 76 02/18/2015   BILITOT 0.4 02/18/2015   GFRNONAA >60 02/18/2015   GFRAA >60 02/18/2015    Lab Results  Component Value Date   WBC 9.2 02/18/2015   NEUTROABS 7.3* 02/18/2015   HGB 9.3* 02/18/2015   HCT 29.4* 02/18/2015   MCV 85.5 02/18/2015   PLT 262 02/18/2015     STUDIES: Nm Pet Image Restage (ps) Whole Body  02/16/2015   CLINICAL DATA:  Subsequent treatment strategy for melanoma  EXAM: NUCLEAR MEDICINE PET WHOLE BODY  TECHNIQUE: 12.3 mCi F-18 FDG was injected intravenously. Full-ring PET imaging was performed  from the vertex to the feet after the radiotracer. CT data was obtained and used for attenuation correction and anatomic localization.  FASTING BLOOD GLUCOSE:  Value:  100 into mg/dl  COMPARISON:  PET-CT 11/04/2014  FINDINGS: Head/Neck: No hypermetabolic lymph nodes in the neck.  Chest: There is an enlarging and hypermetabolic subcutaneous nodule in the upper left posterior thorax on image number 83 measuring 11.5 mm. SUV max is 3.6.  12 mm subcutaneous nodule involving the left upper arm has an SUV max of 1.4.  Right hilar adenopathy appears relatively stable with SUV max of 3.9 persistent dense right upper lobe scarring changes but no focus of hypermetabolism.  There is a moderate size right pleural effusion. Small left effusion.  Enlarging prevascular lymph node measures 25 x 13 mm and is hypermetabolic with SUV max of 5.4.  Left para-aortic soft tissue mass measures 18 x 13 mm and is hypermetabolic with SUV max of 4.7.  15.5 mm subcarinal node has SUV max of 5.7.  No obvious pulmonary metastatic disease.  Abdomen/Pelvis: Gastrohepatic ligament lymph node measures 20 x 13 mm on image number 171 and has SUV max of 3.9.  No findings for solid organ metastatic disease.  17 x 10 mm left inguinal lymph node is hypermetabolic with SUV max of 2.0. This was previously 3.1.   Skeleton: No focal hypermetabolic activity to suggest skeletal metastasis.  Extremities: Again demonstrated is extensive hypermetabolic disease involving the left lower extremity. There are numerous hypermetabolic lymph nodes and numerous subcutaneous nodules.  The largest cutaneous lesion just anterior to the tibia measures 36 x 18 mm and is markedly hypermetabolic with SUV max of 40. This is markedly progressive when compared to the prior study.  Extensive subcutaneous edema involving the left lower extremity.  IMPRESSION: Progressive metastatic melanoma as detailed above. There are enlarging and hypermetabolic subcutaneous nodules involving the thorax and left upper arm, enlarging and increasingly hypermetabolic mediastinal and hilar lymph nodes, new upper abdominal lymph nodes and progressive disease involving the left lower extremity.   Electronically Signed   By: Marijo Sanes M.D.   On: 02/16/2015 12:19    ASSESSMENT:   1.  Recurrent, progressive stage IV melanoma.   2.  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 3.  Iron deficiency anemia.  PLAN:    1. Melanoma: PET scan results are reviewed independently and reported as above with significant progression of disease. After lengthy discussion with patient and her family, no further treatments are desired and patient has agreed to enroll in hospice care. No further follow-up has been scheduled.  2. Iron deficiency anemia: Hemoglobin and iron stores are within normal limits. She last received IV iron in June of 2015. No intervention is needed at this time. 3.  Squamous cell carcinoma:  Unknown primary.  PET lesions are likely from melanoma. 4.  Seizures: MRI brain did not reveal metastatic disease 5.  Peripheral edema: Multifactorial, secondary to hypoalbuminenia and recurrent melanoma. 6.  Shortness of breath:  Chronic, secondary to COPD. 7.  Weakness/Fatigued/Decreased performance status:  Hospice as  above. 8.  Left lower extremity wound: Secondary to melanoma. Treatment per wound care clinic.  Approximately 30 minutes was spent in discussion and consultation.   Melanoma left shin--s/p excision with skin graft   Staging form: Melanoma of the Skin, AJCC 7th Edition     Clinical stage from 01/04/2015: Stage IV (TX, N2, M1a) - Signed by Lloyd Huger, MD on 01/04/2015  Lloyd Huger, MD   03/09/2015 8:52 AM

## 2015-03-10 ENCOUNTER — Encounter: Payer: Self-pay | Admitting: *Deleted

## 2015-03-10 ENCOUNTER — Telehealth: Payer: Self-pay | Admitting: *Deleted

## 2015-03-10 NOTE — Telephone Encounter (Signed)
In lieu of scheduling the patient for a Survivorship Care Plan visit, I am mailing a letter with a Care Plan Summary to the patient.

## 2015-03-23 ENCOUNTER — Other Ambulatory Visit: Payer: Self-pay

## 2015-03-23 ENCOUNTER — Other Ambulatory Visit: Payer: Self-pay | Admitting: *Deleted

## 2015-03-23 MED ORDER — MORPHINE SULFATE (CONCENTRATE) 20 MG/ML PO SOLN: 5.0000 mg | ORAL | Status: AC | PRN

## 2015-03-23 MED ORDER — LORAZEPAM 2 MG/ML PO CONC: ORAL | Status: AC

## 2015-08-27 ENCOUNTER — Other Ambulatory Visit: Payer: Self-pay | Admitting: Nurse Practitioner

## 2015-08-28 ENCOUNTER — Ambulatory Visit: Payer: Self-pay | Admitting: Radiation Oncology

## 2016-04-23 IMAGING — PT NM PET IMAGE RESTAGE (PS) WHOLE BODY
10 series · 25 of 25 positions shown · non-contrast
Comparison: PET-CT 11/04/2014

CLINICAL DATA: Subsequent treatment strategy for melanoma

EXAM:
NUCLEAR MEDICINE PET WHOLE BODY
TECHNIQUE: 12.3 mCi F-18 FDG was injected intravenously. Full-ring PET imaging
was performed from the vertex to the feet after the radiotracer. CT
data was obtained and used for attenuation correction and anatomic
localization.
FASTING BLOOD GLUCOSE:  Value:  100 into mg/dl

[Series 3: ct wb 5.0 b30f · axial · 5.0mm · 0.98mm/px · z∈[-1982,-292]mm · 3 of 564 slices shown]
[im 1/564  soft-tissue]
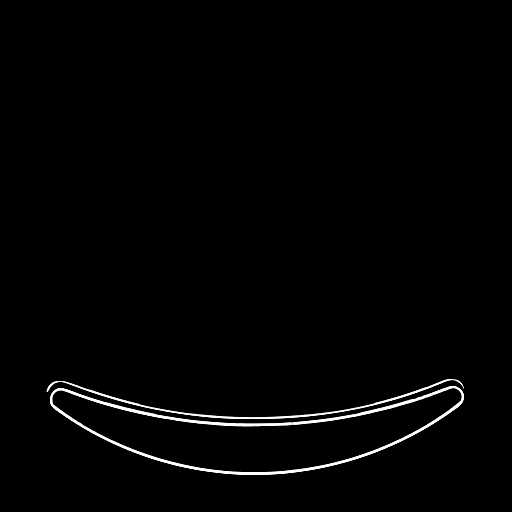
[im 282/564  soft-tissue]
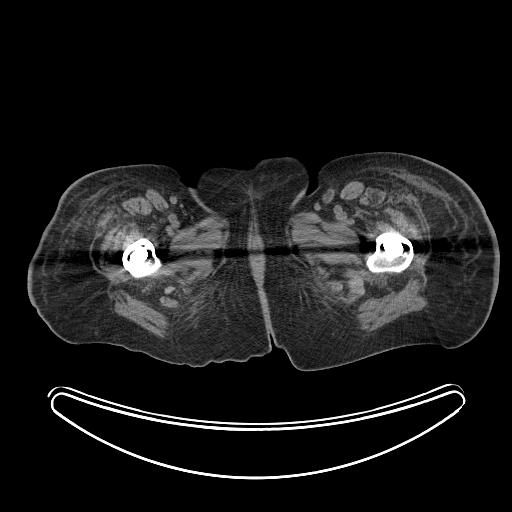
[im 564/564  soft-tissue]
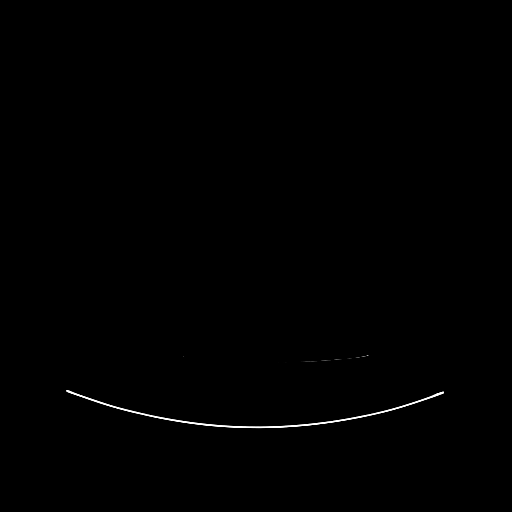

[Series 4: pet wb (ac) · axial · 5.0mm · 4.07mm/px · z∈[-1982,-292]mm · 4 of 564 slices shown]
[im 1/564]
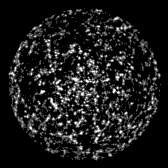
[im 188/564]
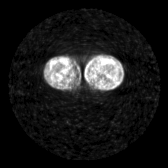
[im 376/564]
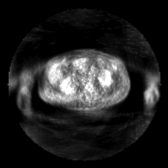
[im 564/564]
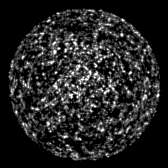

[Series 5: pet wb uncorrected (nac) · axial · 5.0mm · 4.07mm/px · z∈[-1982,-292]mm · 4 of 564 slices shown]
[im 1/564]
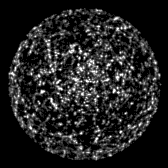
[im 188/564]
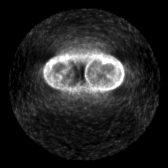
[im 376/564]
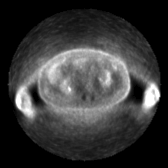
[im 564/564]
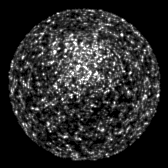

[Series 603: pet/ct axial · 4 of 562 slices shown]
[im 1/562]
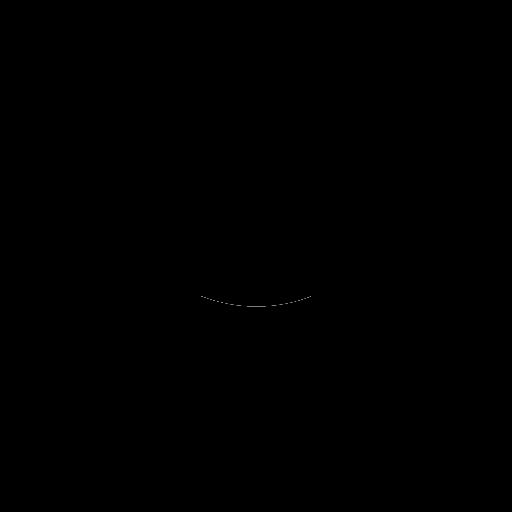
[im 188/562]
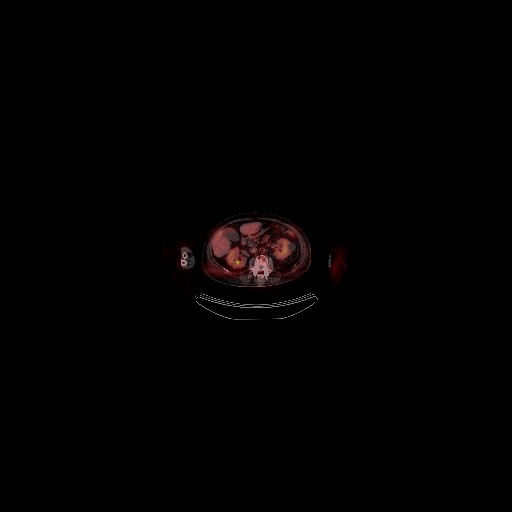
[im 375/562]
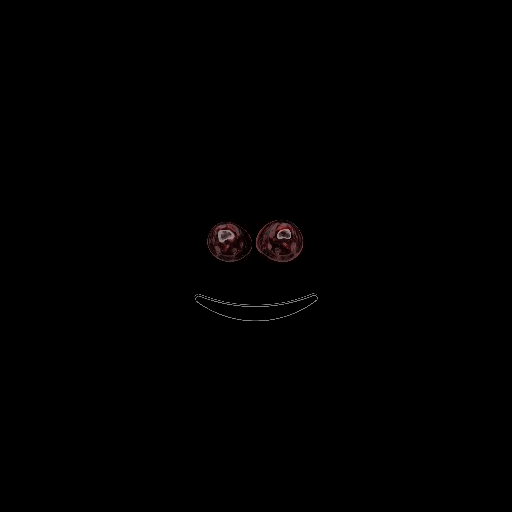
[im 562/562]
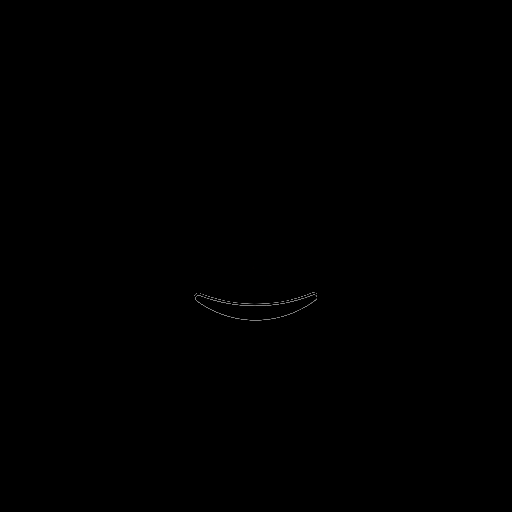

[Series 604: pet/ct coronal · 1 of 50 slices shown]
[im 1/50]
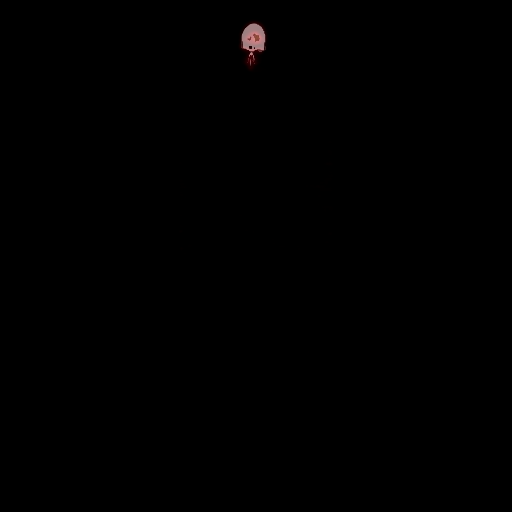

[Series 605: pet/ct sagittal · 1 of 192 slices shown]
[im 1/192]
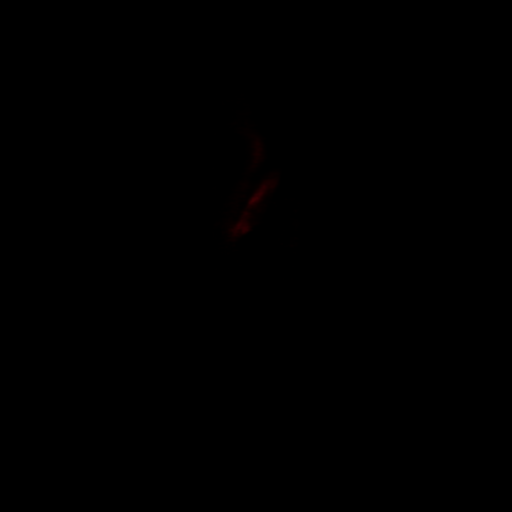

[Series 606: pet axial · 4 of 550 slices shown]
[im 1/550]
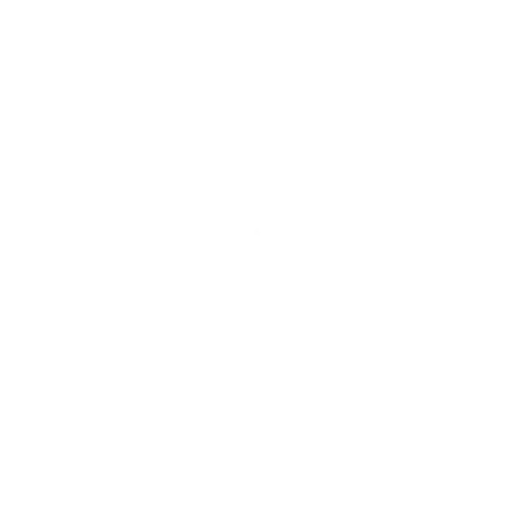
[im 184/550]
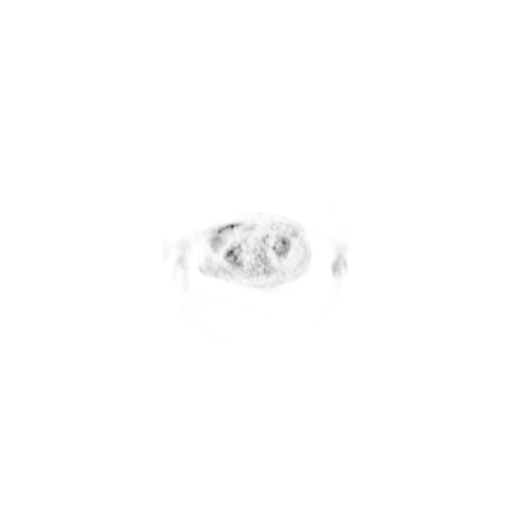
[im 367/550]
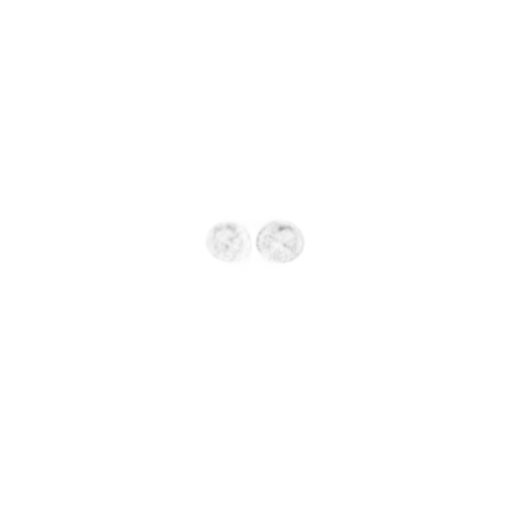
[im 550/550]
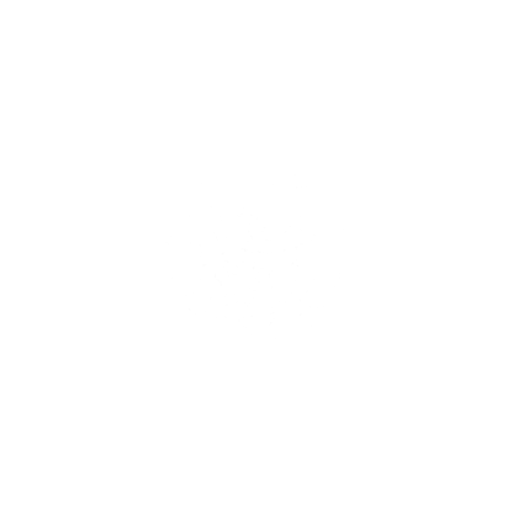

[Series 607: pet coronal · 1 of 102 slices shown]
[im 1/102]
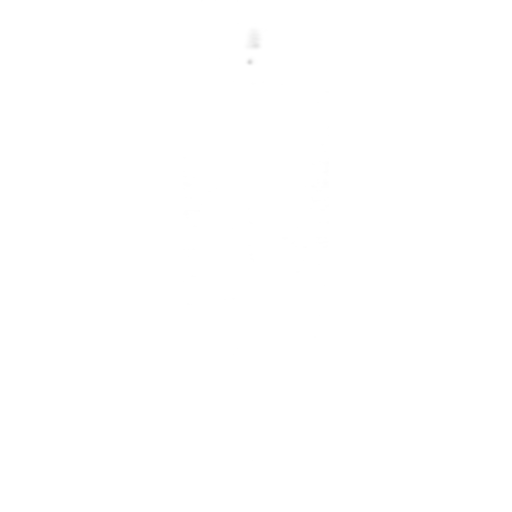

[Series 608: pet sagittal · 2 of 209 slices shown]
[im 1/209]
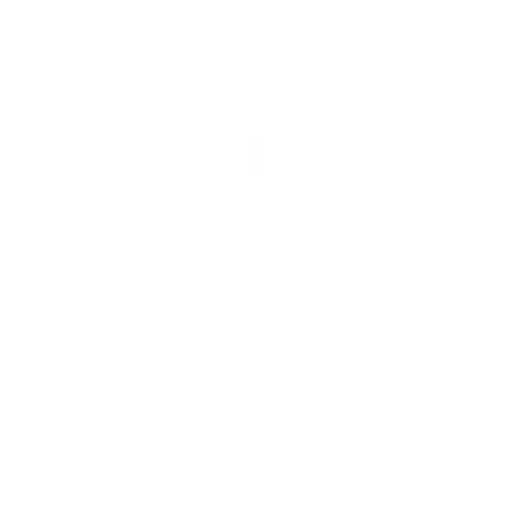
[im 209/209]
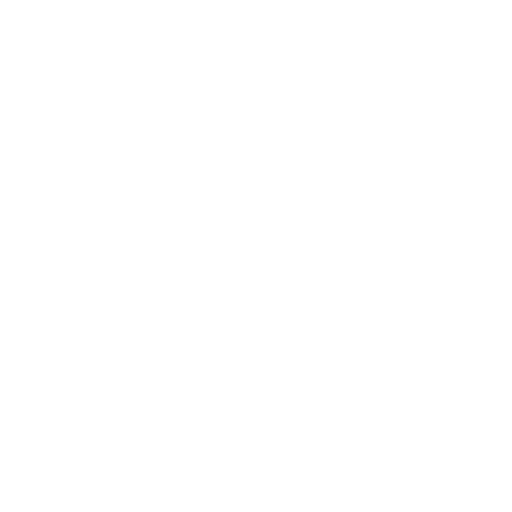

[results mm oncology reading · 1.06mm/px · 1 of 10 slices shown]
[im 1/10]
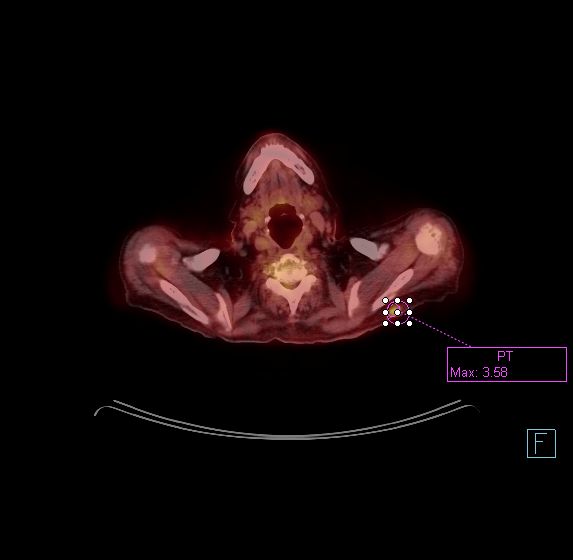

[25 of 25 positions shown; findings below may reference images not displayed]

FINDINGS: Head/Neck: No hypermetabolic lymph nodes in the neck.

Chest: There is an enlarging and hypermetabolic subcutaneous nodule
in the upper left posterior thorax on image number 83 measuring
mm. SUV max is 3.6.

12 mm subcutaneous nodule involving the left upper arm has an SUV
max of 1.4.

Right hilar adenopathy appears relatively stable with SUV max of
persistent dense right upper lobe scarring changes but no focus of
hypermetabolism.

There is a moderate size right pleural effusion. Small left
effusion.

Enlarging prevascular lymph node measures 25 x 13 mm and is
hypermetabolic with SUV max of 5.4.

Left para-aortic soft tissue mass measures 18 x 13 mm and is
hypermetabolic with SUV max of 4.7.

15.5 mm subcarinal node has SUV max of 5.7.

No obvious pulmonary metastatic disease.

Abdomen/Pelvis: Gastrohepatic ligament lymph node measures 20 x 13
mm on image number 171 and has SUV max of 3.9.

No findings for solid organ metastatic disease.

17 x 10 mm left inguinal lymph node is hypermetabolic with SUV max
of 2.0. This was previously 3.1.

Skeleton: No focal hypermetabolic activity to suggest skeletal
metastasis.

Extremities: Again demonstrated is extensive hypermetabolic disease
involving the left lower extremity. There are numerous
hypermetabolic lymph nodes and numerous subcutaneous nodules.

The largest cutaneous lesion just anterior to the tibia measures 36
x 18 mm and is markedly hypermetabolic with SUV max of 40. This is
markedly progressive when compared to the prior study.

Extensive subcutaneous edema involving the left lower extremity.
IMPRESSION: Progressive metastatic melanoma as detailed above. There are
enlarging and hypermetabolic subcutaneous nodules involving the
thorax and left upper arm, enlarging and increasingly hypermetabolic
mediastinal and hilar lymph nodes, new upper abdominal lymph nodes
and progressive disease involving the left lower extremity.
